# Patient Record
Sex: Female | Born: 1945 | Race: White | Hispanic: No | Marital: Married | Smoking: Current every day smoker
Health system: Southern US, Community
[De-identification: ages and names within clinical notes are randomized; demographics above are authoritative.]

## PROBLEM LIST (undated history)

## (undated) DIAGNOSIS — R87610 Atypical squamous cells of undetermined significance on cytologic smear of cervix (ASC-US): Secondary | ICD-10-CM

## (undated) DIAGNOSIS — N8501 Benign endometrial hyperplasia: Secondary | ICD-10-CM

## (undated) DIAGNOSIS — Z9221 Personal history of antineoplastic chemotherapy: Secondary | ICD-10-CM

## (undated) DIAGNOSIS — IMO0002 Reserved for concepts with insufficient information to code with codable children: Secondary | ICD-10-CM

## (undated) DIAGNOSIS — K219 Gastro-esophageal reflux disease without esophagitis: Secondary | ICD-10-CM

## (undated) DIAGNOSIS — C50919 Malignant neoplasm of unspecified site of unspecified female breast: Secondary | ICD-10-CM

## (undated) DIAGNOSIS — Z79890 Hormone replacement therapy (postmenopausal): Secondary | ICD-10-CM

## (undated) DIAGNOSIS — Z9882 Breast implant status: Secondary | ICD-10-CM

## (undated) DIAGNOSIS — L409 Psoriasis, unspecified: Secondary | ICD-10-CM

## (undated) DIAGNOSIS — M1990 Unspecified osteoarthritis, unspecified site: Secondary | ICD-10-CM

## (undated) DIAGNOSIS — Z780 Asymptomatic menopausal state: Secondary | ICD-10-CM

## (undated) DIAGNOSIS — K635 Polyp of colon: Secondary | ICD-10-CM

## (undated) DIAGNOSIS — M810 Age-related osteoporosis without current pathological fracture: Secondary | ICD-10-CM

## (undated) HISTORY — DX: Breast implant status: Z98.82

## (undated) HISTORY — PX: TUBAL LIGATION: SHX77

## (undated) HISTORY — PX: SALPINGECTOMY: SHX328

## (undated) HISTORY — PX: MASTECTOMY: SHX3

## (undated) HISTORY — DX: Atypical squamous cells of undetermined significance on cytologic smear of cervix (ASC-US): R87.610

## (undated) HISTORY — DX: Reserved for concepts with insufficient information to code with codable children: IMO0002

## (undated) HISTORY — DX: Hormone replacement therapy: Z79.890

## (undated) HISTORY — DX: Unspecified osteoarthritis, unspecified site: M19.90

## (undated) HISTORY — DX: Polyp of colon: K63.5

## (undated) HISTORY — DX: Age-related osteoporosis without current pathological fracture: M81.0

## (undated) HISTORY — PX: OTHER SURGICAL HISTORY: SHX169

## (undated) HISTORY — DX: Psoriasis, unspecified: L40.9

## (undated) HISTORY — DX: Asymptomatic menopausal state: Z78.0

## (undated) HISTORY — PX: COLONOSCOPY: SHX174

## (undated) HISTORY — DX: Malignant neoplasm of unspecified site of unspecified female breast: C50.919

## (undated) HISTORY — DX: Benign endometrial hyperplasia: N85.01

---

## 1981-09-07 HISTORY — PX: APPENDECTOMY: SHX54

## 1985-09-07 DIAGNOSIS — Z9882 Breast implant status: Secondary | ICD-10-CM

## 1985-09-07 HISTORY — DX: Breast implant status: Z98.82

## 1985-09-07 HISTORY — PX: BREAST SURGERY: SHX581

## 1998-09-07 DIAGNOSIS — N8501 Benign endometrial hyperplasia: Secondary | ICD-10-CM

## 1998-09-07 HISTORY — DX: Benign endometrial hyperplasia: N85.01

## 1999-03-04 ENCOUNTER — Ambulatory Visit (HOSPITAL_COMMUNITY): Admission: RE | Admit: 1999-03-04 | Discharge: 1999-03-04 | Payer: Self-pay | Admitting: Internal Medicine

## 1999-03-04 ENCOUNTER — Encounter (INDEPENDENT_AMBULATORY_CARE_PROVIDER_SITE_OTHER): Payer: Self-pay | Admitting: Specialist

## 1999-03-27 ENCOUNTER — Encounter: Payer: Self-pay | Admitting: Obstetrics and Gynecology

## 1999-03-27 ENCOUNTER — Ambulatory Visit (HOSPITAL_COMMUNITY): Admission: RE | Admit: 1999-03-27 | Discharge: 1999-03-27 | Payer: Self-pay | Admitting: Obstetrics and Gynecology

## 1999-09-08 DIAGNOSIS — K635 Polyp of colon: Secondary | ICD-10-CM

## 1999-09-08 HISTORY — DX: Polyp of colon: K63.5

## 1999-09-16 ENCOUNTER — Encounter (INDEPENDENT_AMBULATORY_CARE_PROVIDER_SITE_OTHER): Payer: Self-pay

## 1999-09-16 ENCOUNTER — Ambulatory Visit (HOSPITAL_COMMUNITY): Admission: RE | Admit: 1999-09-16 | Discharge: 1999-09-16 | Payer: Self-pay | Admitting: Obstetrics and Gynecology

## 1999-12-29 ENCOUNTER — Other Ambulatory Visit: Admission: RE | Admit: 1999-12-29 | Discharge: 1999-12-29 | Payer: Self-pay | Admitting: Obstetrics and Gynecology

## 2000-02-23 ENCOUNTER — Encounter (INDEPENDENT_AMBULATORY_CARE_PROVIDER_SITE_OTHER): Payer: Self-pay

## 2000-02-23 ENCOUNTER — Ambulatory Visit (HOSPITAL_COMMUNITY): Admission: RE | Admit: 2000-02-23 | Discharge: 2000-02-23 | Payer: Self-pay | Admitting: Gastroenterology

## 2000-08-02 ENCOUNTER — Ambulatory Visit (HOSPITAL_COMMUNITY): Admission: RE | Admit: 2000-08-02 | Discharge: 2000-08-02 | Payer: Self-pay | Admitting: Orthopedic Surgery

## 2000-08-02 ENCOUNTER — Encounter: Payer: Self-pay | Admitting: Orthopedic Surgery

## 2000-08-18 ENCOUNTER — Ambulatory Visit (HOSPITAL_COMMUNITY): Admission: RE | Admit: 2000-08-18 | Discharge: 2000-08-18 | Payer: Self-pay | Admitting: Orthopedic Surgery

## 2000-08-18 ENCOUNTER — Encounter: Payer: Self-pay | Admitting: Orthopedic Surgery

## 2000-09-23 ENCOUNTER — Ambulatory Visit (HOSPITAL_COMMUNITY): Admission: RE | Admit: 2000-09-23 | Discharge: 2000-09-23 | Payer: Self-pay | Admitting: Orthopedic Surgery

## 2000-09-23 ENCOUNTER — Encounter: Payer: Self-pay | Admitting: Orthopedic Surgery

## 2001-05-25 ENCOUNTER — Other Ambulatory Visit: Admission: RE | Admit: 2001-05-25 | Discharge: 2001-05-25 | Payer: Self-pay | Admitting: Obstetrics and Gynecology

## 2001-09-07 DIAGNOSIS — IMO0002 Reserved for concepts with insufficient information to code with codable children: Secondary | ICD-10-CM

## 2001-09-07 HISTORY — DX: Reserved for concepts with insufficient information to code with codable children: IMO0002

## 2002-06-07 ENCOUNTER — Other Ambulatory Visit: Admission: RE | Admit: 2002-06-07 | Discharge: 2002-06-07 | Payer: Self-pay | Admitting: Obstetrics and Gynecology

## 2003-07-04 ENCOUNTER — Other Ambulatory Visit: Admission: RE | Admit: 2003-07-04 | Discharge: 2003-07-04 | Payer: Self-pay | Admitting: Obstetrics and Gynecology

## 2004-03-19 ENCOUNTER — Encounter: Admission: RE | Admit: 2004-03-19 | Discharge: 2004-03-19 | Payer: Self-pay | Admitting: Obstetrics and Gynecology

## 2004-08-13 ENCOUNTER — Other Ambulatory Visit: Admission: RE | Admit: 2004-08-13 | Discharge: 2004-08-13 | Payer: Self-pay | Admitting: Obstetrics and Gynecology

## 2005-04-08 ENCOUNTER — Encounter: Admission: RE | Admit: 2005-04-08 | Discharge: 2005-04-08 | Payer: Self-pay | Admitting: Obstetrics and Gynecology

## 2005-05-11 ENCOUNTER — Emergency Department (HOSPITAL_COMMUNITY): Admission: EM | Admit: 2005-05-11 | Discharge: 2005-05-12 | Payer: Self-pay | Admitting: Emergency Medicine

## 2005-09-29 ENCOUNTER — Ambulatory Visit (HOSPITAL_COMMUNITY): Admission: RE | Admit: 2005-09-29 | Discharge: 2005-09-29 | Payer: Self-pay | Admitting: Internal Medicine

## 2005-10-26 ENCOUNTER — Other Ambulatory Visit: Admission: RE | Admit: 2005-10-26 | Discharge: 2005-10-26 | Payer: Self-pay | Admitting: Obstetrics and Gynecology

## 2006-05-25 ENCOUNTER — Encounter: Admission: RE | Admit: 2006-05-25 | Discharge: 2006-05-25 | Payer: Self-pay | Admitting: Obstetrics and Gynecology

## 2006-06-10 ENCOUNTER — Ambulatory Visit (HOSPITAL_BASED_OUTPATIENT_CLINIC_OR_DEPARTMENT_OTHER): Admission: RE | Admit: 2006-06-10 | Discharge: 2006-06-10 | Payer: Self-pay | Admitting: Orthopedic Surgery

## 2006-06-21 ENCOUNTER — Encounter: Admission: RE | Admit: 2006-06-21 | Discharge: 2006-06-21 | Payer: Self-pay | Admitting: Orthopedic Surgery

## 2006-07-12 ENCOUNTER — Encounter: Admission: RE | Admit: 2006-07-12 | Discharge: 2006-07-12 | Payer: Self-pay | Admitting: Orthopedic Surgery

## 2006-09-29 ENCOUNTER — Encounter: Admission: RE | Admit: 2006-09-29 | Discharge: 2006-09-29 | Payer: Self-pay | Admitting: Orthopedic Surgery

## 2006-11-19 ENCOUNTER — Ambulatory Visit (HOSPITAL_BASED_OUTPATIENT_CLINIC_OR_DEPARTMENT_OTHER): Admission: RE | Admit: 2006-11-19 | Discharge: 2006-11-19 | Payer: Self-pay | Admitting: Orthopedic Surgery

## 2006-12-15 ENCOUNTER — Other Ambulatory Visit: Admission: RE | Admit: 2006-12-15 | Discharge: 2006-12-15 | Payer: Self-pay | Admitting: Obstetrics and Gynecology

## 2007-06-30 ENCOUNTER — Encounter: Admission: RE | Admit: 2007-06-30 | Discharge: 2007-06-30 | Payer: Self-pay | Admitting: Obstetrics and Gynecology

## 2008-01-13 ENCOUNTER — Other Ambulatory Visit: Admission: RE | Admit: 2008-01-13 | Discharge: 2008-01-13 | Payer: Self-pay | Admitting: Obstetrics and Gynecology

## 2008-07-02 ENCOUNTER — Encounter: Admission: RE | Admit: 2008-07-02 | Discharge: 2008-07-02 | Payer: Self-pay | Admitting: Obstetrics and Gynecology

## 2009-07-03 ENCOUNTER — Encounter: Admission: RE | Admit: 2009-07-03 | Discharge: 2009-07-03 | Payer: Self-pay | Admitting: Obstetrics and Gynecology

## 2009-09-07 DIAGNOSIS — C50919 Malignant neoplasm of unspecified site of unspecified female breast: Secondary | ICD-10-CM

## 2009-09-07 HISTORY — DX: Malignant neoplasm of unspecified site of unspecified female breast: C50.919

## 2010-02-20 ENCOUNTER — Encounter: Admission: RE | Admit: 2010-02-20 | Discharge: 2010-02-20 | Payer: Self-pay | Admitting: Orthopedic Surgery

## 2010-03-14 ENCOUNTER — Encounter: Admission: RE | Admit: 2010-03-14 | Discharge: 2010-03-14 | Payer: Self-pay | Admitting: Orthopedic Surgery

## 2010-08-08 ENCOUNTER — Encounter: Admission: RE | Admit: 2010-08-08 | Discharge: 2010-08-08 | Payer: Self-pay | Admitting: Obstetrics and Gynecology

## 2010-08-18 ENCOUNTER — Encounter
Admission: RE | Admit: 2010-08-18 | Discharge: 2010-08-18 | Payer: Self-pay | Source: Home / Self Care | Attending: Obstetrics and Gynecology | Admitting: Obstetrics and Gynecology

## 2010-08-22 ENCOUNTER — Ambulatory Visit: Payer: Self-pay | Admitting: Genetic Counselor

## 2010-08-22 ENCOUNTER — Encounter
Admission: RE | Admit: 2010-08-22 | Discharge: 2010-08-22 | Payer: Self-pay | Source: Home / Self Care | Attending: Obstetrics and Gynecology | Admitting: Obstetrics and Gynecology

## 2010-09-05 ENCOUNTER — Ambulatory Visit (HOSPITAL_BASED_OUTPATIENT_CLINIC_OR_DEPARTMENT_OTHER): Payer: Self-pay | Admitting: Oncology

## 2010-09-09 LAB — COMPREHENSIVE METABOLIC PANEL
ALT: 10 U/L (ref 0–35)
AST: 17 U/L (ref 0–37)
Albumin: 4.1 g/dL (ref 3.5–5.2)
Alkaline Phosphatase: 78 U/L (ref 39–117)
BUN: 19 mg/dL (ref 6–23)
CO2: 25 mEq/L (ref 19–32)
Calcium: 8.5 mg/dL (ref 8.4–10.5)
Chloride: 106 mEq/L (ref 96–112)
Creatinine, Ser: 0.74 mg/dL (ref 0.40–1.20)
Glucose, Bld: 91 mg/dL (ref 70–99)
Potassium: 4.5 mEq/L (ref 3.5–5.3)
Sodium: 140 mEq/L (ref 135–145)
Total Bilirubin: 0.3 mg/dL (ref 0.3–1.2)
Total Protein: 6.2 g/dL (ref 6.0–8.3)

## 2010-09-09 LAB — CBC WITH DIFFERENTIAL/PLATELET
BASO%: 0.5 % (ref 0.0–2.0)
Basophils Absolute: 0 10*3/uL (ref 0.0–0.1)
EOS%: 1 % (ref 0.0–7.0)
Eosinophils Absolute: 0 10*3/uL (ref 0.0–0.5)
HCT: 40.8 % (ref 34.8–46.6)
HGB: 14.1 g/dL (ref 11.6–15.9)
LYMPH%: 34.9 % (ref 14.0–49.7)
MCH: 32.9 pg (ref 25.1–34.0)
MCHC: 34.6 g/dL (ref 31.5–36.0)
MCV: 95.1 fL (ref 79.5–101.0)
MONO#: 0.4 10*3/uL (ref 0.1–0.9)
MONO%: 7.8 % (ref 0.0–14.0)
NEUT#: 2.8 10*3/uL (ref 1.5–6.5)
NEUT%: 55.8 % (ref 38.4–76.8)
Platelets: 361 10*3/uL (ref 145–400)
RBC: 4.29 10*6/uL (ref 3.70–5.45)
RDW: 13.9 % (ref 11.2–14.5)
WBC: 4.9 10*3/uL (ref 3.9–10.3)
lymph#: 1.7 10*3/uL (ref 0.9–3.3)

## 2010-09-09 LAB — CANCER ANTIGEN 27.29: CA 27.29: 30 U/mL (ref 0–39)

## 2010-09-16 ENCOUNTER — Encounter: Payer: Self-pay | Admitting: Oncology

## 2010-09-16 ENCOUNTER — Ambulatory Visit (HOSPITAL_BASED_OUTPATIENT_CLINIC_OR_DEPARTMENT_OTHER)
Admission: RE | Admit: 2010-09-16 | Discharge: 2010-10-01 | Payer: No Typology Code available for payment source | Source: Home / Self Care | Attending: Radiation Oncology | Admitting: Radiation Oncology

## 2010-09-16 ENCOUNTER — Ambulatory Visit (HOSPITAL_COMMUNITY)
Admission: RE | Admit: 2010-09-16 | Discharge: 2010-09-16 | Payer: Self-pay | Source: Home / Self Care | Attending: General Surgery | Admitting: General Surgery

## 2010-09-16 ENCOUNTER — Ambulatory Visit (HOSPITAL_COMMUNITY)
Admission: RE | Admit: 2010-09-16 | Discharge: 2010-09-16 | Payer: Self-pay | Source: Home / Self Care | Attending: Oncology | Admitting: Oncology

## 2010-09-17 LAB — CBC WITH DIFFERENTIAL/PLATELET
BASO%: 0.5 % (ref 0.0–2.0)
Basophils Absolute: 0 10*3/uL (ref 0.0–0.1)
EOS%: 2 % (ref 0.0–7.0)
Eosinophils Absolute: 0.1 10*3/uL (ref 0.0–0.5)
HCT: 40.5 % (ref 34.8–46.6)
HGB: 13.7 g/dL (ref 11.6–15.9)
LYMPH%: 30.4 % (ref 14.0–49.7)
MCH: 32.3 pg (ref 25.1–34.0)
MCHC: 34 g/dL (ref 31.5–36.0)
MCV: 95.2 fL (ref 79.5–101.0)
MONO#: 0.6 10*3/uL (ref 0.1–0.9)
MONO%: 9 % (ref 0.0–14.0)
NEUT#: 3.8 10*3/uL (ref 1.5–6.5)
NEUT%: 58.1 % (ref 38.4–76.8)
Platelets: 350 10*3/uL (ref 145–400)
RBC: 4.25 10*6/uL (ref 3.70–5.45)
RDW: 13.5 % (ref 11.2–14.5)
WBC: 6.6 10*3/uL (ref 3.9–10.3)
lymph#: 2 10*3/uL (ref 0.9–3.3)

## 2010-09-17 LAB — COMPREHENSIVE METABOLIC PANEL
ALT: 12 U/L (ref 0–35)
AST: 15 U/L (ref 0–37)
Albumin: 4.2 g/dL (ref 3.5–5.2)
Alkaline Phosphatase: 86 U/L (ref 39–117)
BUN: 20 mg/dL (ref 6–23)
CO2: 28 mEq/L (ref 19–32)
Calcium: 9 mg/dL (ref 8.4–10.5)
Chloride: 104 mEq/L (ref 96–112)
Creatinine, Ser: 1 mg/dL (ref 0.40–1.20)
Glucose, Bld: 89 mg/dL (ref 70–99)
Potassium: 4.5 mEq/L (ref 3.5–5.3)
Sodium: 139 mEq/L (ref 135–145)
Total Bilirubin: 0.3 mg/dL (ref 0.3–1.2)
Total Protein: 6.4 g/dL (ref 6.0–8.3)

## 2010-09-22 LAB — BASIC METABOLIC PANEL
BUN: 23 mg/dL (ref 6–23)
CO2: 22 mEq/L (ref 19–32)
Calcium: 9.3 mg/dL (ref 8.4–10.5)
Chloride: 103 mEq/L (ref 96–112)
Creatinine, Ser: 1.07 mg/dL (ref 0.4–1.2)
GFR calc Af Amer: >60 mL/min (ref >60)
GFR calc non Af Amer: 52 mL/min — ABNORMAL LOW (ref >60)
Glucose, Bld: 108 mg/dL — ABNORMAL HIGH (ref 70–99)
Potassium: 4.6 mEq/L (ref 3.5–5.1)
Sodium: 139 mEq/L (ref 135–145)

## 2010-09-22 LAB — SURGICAL PCR SCREEN
MRSA, PCR: NEGATIVE
Staphylococcus aureus: NEGATIVE

## 2010-09-22 LAB — CBC
HCT: 44.9 % (ref 36.0–46.0)
Hemoglobin: 15 g/dL (ref 12.0–15.0)
MCH: 31.8 pg (ref 26.0–34.0)
MCHC: 33.4 g/dL (ref 30.0–36.0)
MCV: 95.3 fL (ref 78.0–100.0)
Platelets: 338 10*3/uL (ref 150–400)
RBC: 4.71 MIL/uL (ref 3.87–5.11)
RDW: 13.7 % (ref 11.5–15.5)
WBC: 6.4 10*3/uL (ref 4.0–10.5)

## 2010-09-25 LAB — CBC WITH DIFFERENTIAL/PLATELET
BASO%: 0.5 % (ref 0.0–2.0)
Basophils Absolute: 0 10*3/uL (ref 0.0–0.1)
EOS%: 4.4 % (ref 0.0–7.0)
Eosinophils Absolute: 0.1 10*3/uL (ref 0.0–0.5)
HCT: 40.1 % (ref 34.8–46.6)
HGB: 13.6 g/dL (ref 11.6–15.9)
LYMPH%: 47.9 % (ref 14.0–49.7)
MCH: 32.3 pg (ref 25.1–34.0)
MCHC: 33.9 g/dL (ref 31.5–36.0)
MCV: 95.2 fL (ref 79.5–101.0)
MONO#: 0 10*3/uL — ABNORMAL LOW (ref 0.1–0.9)
MONO%: 1.4 % (ref 0.0–14.0)
NEUT#: 0.8 10*3/uL — ABNORMAL LOW (ref 1.5–6.5)
NEUT%: 45.8 % (ref 38.4–76.8)
Platelets: 192 10*3/uL (ref 145–400)
RBC: 4.22 10*6/uL (ref 3.70–5.45)
RDW: 13.5 % (ref 11.2–14.5)
WBC: 1.7 10*3/uL — ABNORMAL LOW (ref 3.9–10.3)
lymph#: 0.8 10*3/uL — ABNORMAL LOW (ref 0.9–3.3)

## 2010-09-25 LAB — BASIC METABOLIC PANEL
BUN: 21 mg/dL (ref 6–23)
CO2: 30 mEq/L (ref 19–32)
Calcium: 10.1 mg/dL (ref 8.4–10.5)
Chloride: 102 mEq/L (ref 96–112)
Creatinine, Ser: 0.82 mg/dL (ref 0.40–1.20)
Glucose, Bld: 100 mg/dL — ABNORMAL HIGH (ref 70–99)
Potassium: 4.6 mEq/L (ref 3.5–5.3)
Sodium: 141 mEq/L (ref 135–145)

## 2010-09-27 ENCOUNTER — Encounter: Payer: Self-pay | Admitting: Obstetrics and Gynecology

## 2010-09-28 ENCOUNTER — Encounter: Payer: Self-pay | Admitting: Orthopedic Surgery

## 2010-09-29 ENCOUNTER — Encounter: Payer: Self-pay | Admitting: Obstetrics and Gynecology

## 2010-10-02 DIAGNOSIS — C50919 Malignant neoplasm of unspecified site of unspecified female breast: Secondary | ICD-10-CM

## 2010-10-02 LAB — COMPREHENSIVE METABOLIC PANEL
ALT: 16 U/L (ref 0–35)
AST: 19 U/L (ref 0–37)
Albumin: 4.2 g/dL (ref 3.5–5.2)
Alkaline Phosphatase: 117 U/L (ref 39–117)
BUN: 14 mg/dL (ref 6–23)
CO2: 26 mEq/L (ref 19–32)
Calcium: 9.2 mg/dL (ref 8.4–10.5)
Chloride: 106 mEq/L (ref 96–112)
Creatinine, Ser: 0.87 mg/dL (ref 0.40–1.20)
Glucose, Bld: 92 mg/dL (ref 70–99)
Potassium: 4.4 mEq/L (ref 3.5–5.3)
Sodium: 141 mEq/L (ref 135–145)
Total Bilirubin: 0.2 mg/dL — ABNORMAL LOW (ref 0.3–1.2)
Total Protein: 6.2 g/dL (ref 6.0–8.3)

## 2010-10-02 LAB — CBC WITH DIFFERENTIAL/PLATELET
BASO%: 0.3 % (ref 0.0–2.0)
Basophils Absolute: 0 10*3/uL (ref 0.0–0.1)
EOS%: 0.7 % (ref 0.0–7.0)
Eosinophils Absolute: 0.1 10*3/uL (ref 0.0–0.5)
HCT: 36.2 % (ref 34.8–46.6)
HGB: 12.4 g/dL (ref 11.6–15.9)
LYMPH%: 19.9 % (ref 14.0–49.7)
MCH: 32.5 pg (ref 25.1–34.0)
MCHC: 34.3 g/dL (ref 31.5–36.0)
MCV: 94.8 fL (ref 79.5–101.0)
MONO#: 0.5 10*3/uL (ref 0.1–0.9)
MONO%: 4.3 % (ref 0.0–14.0)
NEUT#: 9.2 10*3/uL — ABNORMAL HIGH (ref 1.5–6.5)
NEUT%: 74.8 % (ref 38.4–76.8)
Platelets: 332 10*3/uL (ref 145–400)
RBC: 3.82 10*6/uL (ref 3.70–5.45)
RDW: 13 % (ref 11.2–14.5)
WBC: 12.4 10*3/uL — ABNORMAL HIGH (ref 3.9–10.3)
lymph#: 2.5 10*3/uL (ref 0.9–3.3)

## 2010-10-08 ENCOUNTER — Ambulatory Visit: Payer: No Typology Code available for payment source | Admitting: Radiation Oncology

## 2010-10-09 ENCOUNTER — Encounter (HOSPITAL_BASED_OUTPATIENT_CLINIC_OR_DEPARTMENT_OTHER): Payer: No Typology Code available for payment source | Admitting: Oncology

## 2010-10-09 DIAGNOSIS — C50919 Malignant neoplasm of unspecified site of unspecified female breast: Secondary | ICD-10-CM

## 2010-10-09 LAB — CBC WITH DIFFERENTIAL/PLATELET
BASO%: 0.9 % (ref 0.0–2.0)
Basophils Absolute: 0 10*3/uL (ref 0.0–0.1)
EOS%: 0.4 % (ref 0.0–7.0)
Eosinophils Absolute: 0 10*3/uL (ref 0.0–0.5)
HCT: 35.5 % (ref 34.8–46.6)
HGB: 12.2 g/dL (ref 11.6–15.9)
LYMPH%: 28.6 % (ref 14.0–49.7)
MCH: 32 pg (ref 25.1–34.0)
MCHC: 34.3 g/dL (ref 31.5–36.0)
MCV: 93.5 fL (ref 79.5–101.0)
MONO#: 0 10*3/uL — ABNORMAL LOW (ref 0.1–0.9)
MONO%: 1.6 % (ref 0.0–14.0)
NEUT#: 1.7 10*3/uL (ref 1.5–6.5)
NEUT%: 68.5 % (ref 38.4–76.8)
Platelets: 208 10*3/uL (ref 145–400)
RBC: 3.8 10*6/uL (ref 3.70–5.45)
RDW: 13.4 % (ref 11.2–14.5)
WBC: 2.5 10*3/uL — ABNORMAL LOW (ref 3.9–10.3)
lymph#: 0.7 10*3/uL — ABNORMAL LOW (ref 0.9–3.3)

## 2010-10-09 LAB — BASIC METABOLIC PANEL
BUN: 19 mg/dL (ref 6–23)
CO2: 23 mEq/L (ref 19–32)
Calcium: 8.9 mg/dL (ref 8.4–10.5)
Chloride: 102 mEq/L (ref 96–112)
Creatinine, Ser: 0.79 mg/dL (ref 0.40–1.20)
Glucose, Bld: 96 mg/dL (ref 70–99)
Potassium: 4.4 mEq/L (ref 3.5–5.3)
Sodium: 138 mEq/L (ref 135–145)

## 2010-10-13 ENCOUNTER — Other Ambulatory Visit: Payer: Self-pay | Admitting: Oncology

## 2010-10-13 ENCOUNTER — Other Ambulatory Visit (HOSPITAL_COMMUNITY): Payer: Self-pay | Admitting: Internal Medicine

## 2010-10-13 ENCOUNTER — Ambulatory Visit (HOSPITAL_COMMUNITY)
Admission: RE | Admit: 2010-10-13 | Discharge: 2010-10-13 | Disposition: A | Discharge status: Home / Self Care | Payer: No Typology Code available for payment source | Source: Ambulatory Visit | Attending: Oncology | Admitting: Oncology

## 2010-10-13 ENCOUNTER — Encounter (HOSPITAL_BASED_OUTPATIENT_CLINIC_OR_DEPARTMENT_OTHER): Payer: No Typology Code available for payment source | Admitting: Oncology

## 2010-10-13 DIAGNOSIS — C50919 Malignant neoplasm of unspecified site of unspecified female breast: Secondary | ICD-10-CM

## 2010-10-13 DIAGNOSIS — R1310 Dysphagia, unspecified: Secondary | ICD-10-CM

## 2010-10-13 DIAGNOSIS — Z5189 Encounter for other specified aftercare: Secondary | ICD-10-CM

## 2010-10-13 DIAGNOSIS — Z5111 Encounter for antineoplastic chemotherapy: Secondary | ICD-10-CM

## 2010-10-13 LAB — CBC WITH DIFFERENTIAL/PLATELET
BASO%: 0.4 % (ref 0.0–2.0)
Basophils Absolute: 0 10*3/uL (ref 0.0–0.1)
EOS%: 0.2 % (ref 0.0–7.0)
Eosinophils Absolute: 0 10*3/uL (ref 0.0–0.5)
HCT: 34.5 % — ABNORMAL LOW (ref 34.8–46.6)
HGB: 11.8 g/dL (ref 11.6–15.9)
LYMPH%: 16.4 % (ref 14.0–49.7)
MCH: 32.1 pg (ref 25.1–34.0)
MCHC: 34.3 g/dL (ref 31.5–36.0)
MCV: 93.7 fL (ref 79.5–101.0)
MONO#: 0.4 10*3/uL (ref 0.1–0.9)
MONO%: 4.8 % (ref 0.0–14.0)
NEUT#: 7.3 10*3/uL — ABNORMAL HIGH (ref 1.5–6.5)
NEUT%: 78.2 % — ABNORMAL HIGH (ref 38.4–76.8)
Platelets: 332 10*3/uL (ref 145–400)
RBC: 3.68 10*6/uL — ABNORMAL LOW (ref 3.70–5.45)
RDW: 13.3 % (ref 11.2–14.5)
WBC: 9.4 10*3/uL (ref 3.9–10.3)
lymph#: 1.5 10*3/uL (ref 0.9–3.3)

## 2010-10-13 LAB — COMPREHENSIVE METABOLIC PANEL
ALT: 18 U/L (ref 0–35)
AST: 16 U/L (ref 0–37)
Albumin: 3.7 g/dL (ref 3.5–5.2)
Alkaline Phosphatase: 128 U/L — ABNORMAL HIGH (ref 39–117)
BUN: 16 mg/dL (ref 6–23)
CO2: 29 mEq/L (ref 19–32)
Calcium: 9.3 mg/dL (ref 8.4–10.5)
Chloride: 104 mEq/L (ref 96–112)
Creatinine, Ser: 0.8 mg/dL (ref 0.40–1.20)
Glucose, Bld: 71 mg/dL (ref 70–99)
Potassium: 4.3 mEq/L (ref 3.5–5.3)
Sodium: 140 mEq/L (ref 135–145)
Total Bilirubin: 0.5 mg/dL (ref 0.3–1.2)
Total Protein: 5.9 g/dL — ABNORMAL LOW (ref 6.0–8.3)

## 2010-10-17 ENCOUNTER — Encounter (HOSPITAL_BASED_OUTPATIENT_CLINIC_OR_DEPARTMENT_OTHER): Payer: No Typology Code available for payment source | Admitting: Oncology

## 2010-10-17 DIAGNOSIS — Z5111 Encounter for antineoplastic chemotherapy: Secondary | ICD-10-CM

## 2010-10-18 ENCOUNTER — Encounter: Payer: No Typology Code available for payment source | Admitting: Oncology

## 2010-10-18 DIAGNOSIS — C50919 Malignant neoplasm of unspecified site of unspecified female breast: Secondary | ICD-10-CM

## 2010-10-18 DIAGNOSIS — Z5189 Encounter for other specified aftercare: Secondary | ICD-10-CM

## 2010-10-23 ENCOUNTER — Other Ambulatory Visit: Payer: Self-pay | Admitting: Oncology

## 2010-10-23 ENCOUNTER — Encounter (HOSPITAL_BASED_OUTPATIENT_CLINIC_OR_DEPARTMENT_OTHER): Payer: No Typology Code available for payment source | Admitting: Oncology

## 2010-10-23 DIAGNOSIS — Z170 Estrogen receptor positive status [ER+]: Secondary | ICD-10-CM

## 2010-10-23 DIAGNOSIS — C50419 Malignant neoplasm of upper-outer quadrant of unspecified female breast: Secondary | ICD-10-CM

## 2010-10-23 DIAGNOSIS — C50919 Malignant neoplasm of unspecified site of unspecified female breast: Secondary | ICD-10-CM

## 2010-10-23 LAB — CBC WITH DIFFERENTIAL/PLATELET
BASO%: 0.7 % (ref 0.0–2.0)
Basophils Absolute: 0 10*3/uL (ref 0.0–0.1)
EOS%: 1 % (ref 0.0–7.0)
Eosinophils Absolute: 0 10*3/uL (ref 0.0–0.5)
HCT: 31.3 % — ABNORMAL LOW (ref 34.8–46.6)
HGB: 10.7 g/dL — ABNORMAL LOW (ref 11.6–15.9)
LYMPH%: 16.3 % (ref 14.0–49.7)
MCH: 31.6 pg (ref 25.1–34.0)
MCHC: 34.2 g/dL (ref 31.5–36.0)
MCV: 92.3 fL (ref 79.5–101.0)
MONO#: 0 10*3/uL — ABNORMAL LOW (ref 0.1–0.9)
MONO%: 1.1 % (ref 0.0–14.0)
NEUT#: 2.5 10*3/uL (ref 1.5–6.5)
NEUT%: 80.9 % — ABNORMAL HIGH (ref 38.4–76.8)
Platelets: 174 10*3/uL (ref 145–400)
RBC: 3.39 10*6/uL — ABNORMAL LOW (ref 3.70–5.45)
RDW: 13.5 % (ref 11.2–14.5)
WBC: 3.1 10*3/uL — ABNORMAL LOW (ref 3.9–10.3)
lymph#: 0.5 10*3/uL — ABNORMAL LOW (ref 0.9–3.3)

## 2010-10-23 LAB — BASIC METABOLIC PANEL
BUN: 17 mg/dL (ref 6–23)
CO2: 27 mEq/L (ref 19–32)
Calcium: 8.9 mg/dL (ref 8.4–10.5)
Chloride: 100 mEq/L (ref 96–112)
Creatinine, Ser: 0.72 mg/dL (ref 0.40–1.20)
Glucose, Bld: 105 mg/dL — ABNORMAL HIGH (ref 70–99)
Potassium: 4.3 mEq/L (ref 3.5–5.3)
Sodium: 137 mEq/L (ref 135–145)

## 2010-10-30 ENCOUNTER — Encounter (HOSPITAL_BASED_OUTPATIENT_CLINIC_OR_DEPARTMENT_OTHER): Payer: No Typology Code available for payment source | Admitting: Oncology

## 2010-10-30 ENCOUNTER — Other Ambulatory Visit: Payer: Self-pay | Admitting: Oncology

## 2010-10-30 DIAGNOSIS — Z170 Estrogen receptor positive status [ER+]: Secondary | ICD-10-CM

## 2010-10-30 DIAGNOSIS — C50919 Malignant neoplasm of unspecified site of unspecified female breast: Secondary | ICD-10-CM

## 2010-10-30 DIAGNOSIS — C50419 Malignant neoplasm of upper-outer quadrant of unspecified female breast: Secondary | ICD-10-CM

## 2010-10-30 LAB — COMPREHENSIVE METABOLIC PANEL
ALT: 43 U/L — ABNORMAL HIGH (ref 0–35)
AST: 40 U/L — ABNORMAL HIGH (ref 0–37)
Albumin: 4.2 g/dL (ref 3.5–5.2)
Alkaline Phosphatase: 132 U/L — ABNORMAL HIGH (ref 39–117)
BUN: 15 mg/dL (ref 6–23)
CO2: 29 mEq/L (ref 19–32)
Calcium: 9.5 mg/dL (ref 8.4–10.5)
Chloride: 105 mEq/L (ref 96–112)
Creatinine, Ser: 0.89 mg/dL (ref 0.40–1.20)
Glucose, Bld: 96 mg/dL (ref 70–99)
Potassium: 4.4 mEq/L (ref 3.5–5.3)
Sodium: 140 mEq/L (ref 135–145)
Total Bilirubin: 0.3 mg/dL (ref 0.3–1.2)
Total Protein: 6.2 g/dL (ref 6.0–8.3)

## 2010-10-30 LAB — CBC WITH DIFFERENTIAL/PLATELET
BASO%: 0.5 % (ref 0.0–2.0)
Basophils Absolute: 0 10*3/uL (ref 0.0–0.1)
EOS%: 0.3 % (ref 0.0–7.0)
Eosinophils Absolute: 0 10*3/uL (ref 0.0–0.5)
HCT: 31.7 % — ABNORMAL LOW (ref 34.8–46.6)
HGB: 10.9 g/dL — ABNORMAL LOW (ref 11.6–15.9)
LYMPH%: 14.3 % (ref 14.0–49.7)
MCH: 31.8 pg (ref 25.1–34.0)
MCHC: 34.3 g/dL (ref 31.5–36.0)
MCV: 92.7 fL (ref 79.5–101.0)
MONO#: 0.8 10*3/uL (ref 0.1–0.9)
MONO%: 8 % (ref 0.0–14.0)
NEUT#: 7.8 10*3/uL — ABNORMAL HIGH (ref 1.5–6.5)
NEUT%: 76.9 % — ABNORMAL HIGH (ref 38.4–76.8)
Platelets: 532 10*3/uL — ABNORMAL HIGH (ref 145–400)
RBC: 3.42 10*6/uL — ABNORMAL LOW (ref 3.70–5.45)
RDW: 13.4 % (ref 11.2–14.5)
WBC: 10.1 10*3/uL (ref 3.9–10.3)
lymph#: 1.4 10*3/uL (ref 0.9–3.3)

## 2010-10-31 ENCOUNTER — Other Ambulatory Visit: Payer: Self-pay | Admitting: Oncology

## 2010-10-31 ENCOUNTER — Encounter (HOSPITAL_BASED_OUTPATIENT_CLINIC_OR_DEPARTMENT_OTHER): Payer: No Typology Code available for payment source | Admitting: Oncology

## 2010-10-31 DIAGNOSIS — C50911 Malignant neoplasm of unspecified site of right female breast: Secondary | ICD-10-CM

## 2010-10-31 DIAGNOSIS — C50919 Malignant neoplasm of unspecified site of unspecified female breast: Secondary | ICD-10-CM

## 2010-10-31 DIAGNOSIS — Z5111 Encounter for antineoplastic chemotherapy: Secondary | ICD-10-CM

## 2010-11-01 ENCOUNTER — Encounter (HOSPITAL_BASED_OUTPATIENT_CLINIC_OR_DEPARTMENT_OTHER): Payer: No Typology Code available for payment source | Admitting: Oncology

## 2010-11-01 DIAGNOSIS — C50919 Malignant neoplasm of unspecified site of unspecified female breast: Secondary | ICD-10-CM

## 2010-11-01 DIAGNOSIS — Z5189 Encounter for other specified aftercare: Secondary | ICD-10-CM

## 2010-11-03 ENCOUNTER — Ambulatory Visit
Admission: RE | Admit: 2010-11-03 | Discharge: 2010-11-03 | Disposition: A | Discharge status: Home / Self Care | Payer: No Typology Code available for payment source | Source: Ambulatory Visit | Attending: Oncology | Admitting: Oncology

## 2010-11-03 DIAGNOSIS — C50911 Malignant neoplasm of unspecified site of right female breast: Secondary | ICD-10-CM

## 2010-11-03 MED ORDER — GADOBENATE DIMEGLUMINE 529 MG/ML IV SOLN
12.0000 mL | Freq: Once | INTRAVENOUS | Status: AC | PRN
  Administered 2010-11-03: 12 mL via INTRAVENOUS

## 2010-11-07 ENCOUNTER — Other Ambulatory Visit: Payer: Self-pay | Admitting: Oncology

## 2010-11-07 ENCOUNTER — Encounter (INDEPENDENT_AMBULATORY_CARE_PROVIDER_SITE_OTHER): Payer: Self-pay | Admitting: *Deleted

## 2010-11-07 ENCOUNTER — Encounter (HOSPITAL_BASED_OUTPATIENT_CLINIC_OR_DEPARTMENT_OTHER): Payer: No Typology Code available for payment source | Admitting: Oncology

## 2010-11-07 DIAGNOSIS — R1310 Dysphagia, unspecified: Secondary | ICD-10-CM

## 2010-11-07 DIAGNOSIS — Z170 Estrogen receptor positive status [ER+]: Secondary | ICD-10-CM

## 2010-11-07 DIAGNOSIS — C50419 Malignant neoplasm of upper-outer quadrant of unspecified female breast: Secondary | ICD-10-CM

## 2010-11-07 DIAGNOSIS — C50919 Malignant neoplasm of unspecified site of unspecified female breast: Secondary | ICD-10-CM

## 2010-11-07 LAB — BASIC METABOLIC PANEL
BUN: 24 mg/dL — ABNORMAL HIGH (ref 6–23)
CO2: 25 mEq/L (ref 19–32)
Calcium: 8.8 mg/dL (ref 8.4–10.5)
Chloride: 105 mEq/L (ref 96–112)
Creatinine, Ser: 0.81 mg/dL (ref 0.40–1.20)
Glucose, Bld: 72 mg/dL (ref 70–99)
Potassium: 4.3 mEq/L (ref 3.5–5.3)
Sodium: 141 mEq/L (ref 135–145)

## 2010-11-07 LAB — CBC WITH DIFFERENTIAL/PLATELET
BASO%: 1 % (ref 0.0–2.0)
Basophils Absolute: 0 10*3/uL (ref 0.0–0.1)
EOS%: 0.6 % (ref 0.0–7.0)
Eosinophils Absolute: 0 10*3/uL (ref 0.0–0.5)
HCT: 27.4 % — ABNORMAL LOW (ref 34.8–46.6)
HGB: 9.5 g/dL — ABNORMAL LOW (ref 11.6–15.9)
LYMPH%: 13.6 % — ABNORMAL LOW (ref 14.0–49.7)
MCH: 31.8 pg (ref 25.1–34.0)
MCHC: 34.7 g/dL (ref 31.5–36.0)
MCV: 91.8 fL (ref 79.5–101.0)
MONO#: 0.2 10*3/uL (ref 0.1–0.9)
MONO%: 4.5 % (ref 0.0–14.0)
NEUT#: 3.5 10*3/uL (ref 1.5–6.5)
NEUT%: 80.3 % — ABNORMAL HIGH (ref 38.4–76.8)
Platelets: 253 10*3/uL (ref 145–400)
RBC: 2.99 10*6/uL — ABNORMAL LOW (ref 3.70–5.45)
RDW: 14.1 % (ref 11.2–14.5)
WBC: 4.4 10*3/uL (ref 3.9–10.3)
lymph#: 0.6 10*3/uL — ABNORMAL LOW (ref 0.9–3.3)

## 2010-11-13 ENCOUNTER — Other Ambulatory Visit: Payer: Self-pay | Admitting: Oncology

## 2010-11-13 ENCOUNTER — Encounter (HOSPITAL_BASED_OUTPATIENT_CLINIC_OR_DEPARTMENT_OTHER): Payer: No Typology Code available for payment source | Admitting: Oncology

## 2010-11-13 DIAGNOSIS — Z170 Estrogen receptor positive status [ER+]: Secondary | ICD-10-CM

## 2010-11-13 DIAGNOSIS — C50919 Malignant neoplasm of unspecified site of unspecified female breast: Secondary | ICD-10-CM

## 2010-11-13 DIAGNOSIS — Z5111 Encounter for antineoplastic chemotherapy: Secondary | ICD-10-CM

## 2010-11-13 DIAGNOSIS — C50419 Malignant neoplasm of upper-outer quadrant of unspecified female breast: Secondary | ICD-10-CM

## 2010-11-13 LAB — COMPREHENSIVE METABOLIC PANEL
ALT: 12 U/L (ref 0–35)
AST: 18 U/L (ref 0–37)
Albumin: 4.1 g/dL (ref 3.5–5.2)
Alkaline Phosphatase: 132 U/L — ABNORMAL HIGH (ref 39–117)
BUN: 17 mg/dL (ref 6–23)
CO2: 23 mEq/L (ref 19–32)
Calcium: 8.7 mg/dL (ref 8.4–10.5)
Chloride: 105 mEq/L (ref 96–112)
Creatinine, Ser: 0.85 mg/dL (ref 0.40–1.20)
Glucose, Bld: 97 mg/dL (ref 70–99)
Potassium: 4.3 mEq/L (ref 3.5–5.3)
Sodium: 140 mEq/L (ref 135–145)
Total Bilirubin: 0.3 mg/dL (ref 0.3–1.2)
Total Protein: 5.6 g/dL — ABNORMAL LOW (ref 6.0–8.3)

## 2010-11-13 LAB — CBC WITH DIFFERENTIAL/PLATELET
BASO%: 2.2 % — ABNORMAL HIGH (ref 0.0–2.0)
Basophils Absolute: 0.4 10*3/uL — ABNORMAL HIGH (ref 0.0–0.1)
EOS%: 0.2 % (ref 0.0–7.0)
Eosinophils Absolute: 0 10*3/uL (ref 0.0–0.5)
HCT: 28.4 % — ABNORMAL LOW (ref 34.8–46.6)
HGB: 9.5 g/dL — ABNORMAL LOW (ref 11.6–15.9)
LYMPH%: 12.1 % — ABNORMAL LOW (ref 14.0–49.7)
MCH: 31.4 pg (ref 25.1–34.0)
MCHC: 33.5 g/dL (ref 31.5–36.0)
MCV: 93.7 fL (ref 79.5–101.0)
MONO#: 1.8 10*3/uL — ABNORMAL HIGH (ref 0.1–0.9)
MONO%: 10.4 % (ref 0.0–14.0)
NEUT#: 13.2 10*3/uL — ABNORMAL HIGH (ref 1.5–6.5)
NEUT%: 75.1 % (ref 38.4–76.8)
Platelets: 362 10*3/uL (ref 145–400)
RBC: 3.03 10*6/uL — ABNORMAL LOW (ref 3.70–5.45)
RDW: 16.8 % — ABNORMAL HIGH (ref 11.2–14.5)
WBC: 17.6 10*3/uL — ABNORMAL HIGH (ref 3.9–10.3)
lymph#: 2.1 10*3/uL (ref 0.9–3.3)
nRBC: 2 % — ABNORMAL HIGH (ref 0–0)

## 2010-11-13 NOTE — Letter (Signed)
Summary: New Patient letter  [facility]  [address] [place], [place] [address]   Phone: [phone]  Fax: [phone]       11/07/2010 MRN: [id number]  [facility] [address] [place], [place]  [address]  Dear [name],  Welcome to the Gastroenterology Division at [facility]    You are scheduled to see Dr.  Arlyce Dice on 12-17-10 at 2:00P.M. on the 3rd floor at [facility], [address].  We ask that you try to arrive at our office 15 minutes prior to your appointment time to allow for check-in.  We would like you to complete the enclosed self-administered evaluation form prior to your visit and bring it with you on the day of your appointment.  We will review it with you.  Also, please bring a complete list of all your medications or, if you prefer, bring the medication bottles and we will list them.  Please bring your insurance card so that we may make a copy of it.  If your insurance requires a referral to see a specialist, please bring your referral form from your primary care physician.  Co-payments are due at the time of your visit and may be paid by cash, check or credit card.     Your office visit will consist of a consult with your physician (includes a physical exam), any laboratory testing he/she may order, scheduling of any necessary diagnostic testing (e.g. x-ray, ultrasound, CT-scan), and scheduling of a procedure (e.g. Endoscopy, Colonoscopy) if required.  Please allow enough time on your schedule to allow for any/all of these possibilities.    If you cannot keep your appointment, please call [phone] to cancel or reschedule prior to your appointment date.  This allows [place] the opportunity to schedule an appointment for another patient in need of care.  If you do not cancel or reschedule by 5 p.m. the business day prior to your appointment date, you will be charged a $50.00 late cancellation/no-show fee.    Thank you for choosing  [facility] for your medical needs.  We appreciate the opportunity to care for you.  Please visit [place] at our website  to learn more about our practice.                     Sincerely,                                                             The Gastroenterology Division

## 2010-11-14 ENCOUNTER — Telehealth (INDEPENDENT_AMBULATORY_CARE_PROVIDER_SITE_OTHER): Payer: Self-pay

## 2010-11-17 ENCOUNTER — Ambulatory Visit: Payer: No Typology Code available for payment source | Admitting: Physician Assistant

## 2010-11-18 NOTE — Progress Notes (Signed)
Summary: Schedule office visit  ---- Converted from flag ---- ---- 11/14/2010 11:17 AM, Louis Meckel MD wrote: Have her see a PA/NP instead  ---- 11/14/2010 10:18 AM, Selinda Michaels RN wrote: Aurea Graff has told us absolutely no overbooking what so ever. You will have to check with Aurea Graff for this, I cannot overbook now during that time.  ---- 11/14/2010 8:34 AM, Louis Meckel MD wrote: Just double book her per my instructions  ---- 11/13/2010 2:26 PM, Selinda Michaels RN wrote: Dr. Arlyce Dice,  I looked at your schedule when you return and there are no appointment slots available. We are limited due to epic restrictions. Do you want her scheduled with one of the midlevels?  Bonita Quin ------------------------------  Phone Note Outgoing Call Call back at [facility] [phone]   Call placed by: Darcey Nora RN, CGRN,  November 14, 2010 4:02 PM Call placed to: Patient Summary of Call: patient advised of appointment scheduled for Mike Gip PA on 11/17/10 2:00 Initial call taken by: Darcey Nora RN, CGRN,  November 14, 2010 4:03 PM

## 2010-11-19 ENCOUNTER — Other Ambulatory Visit: Payer: Self-pay | Admitting: Oncology

## 2010-11-19 ENCOUNTER — Encounter (HOSPITAL_BASED_OUTPATIENT_CLINIC_OR_DEPARTMENT_OTHER): Payer: No Typology Code available for payment source | Admitting: Oncology

## 2010-11-19 DIAGNOSIS — Z170 Estrogen receptor positive status [ER+]: Secondary | ICD-10-CM

## 2010-11-19 DIAGNOSIS — Z5189 Encounter for other specified aftercare: Secondary | ICD-10-CM

## 2010-11-19 DIAGNOSIS — C50419 Malignant neoplasm of upper-outer quadrant of unspecified female breast: Secondary | ICD-10-CM

## 2010-11-19 DIAGNOSIS — C50919 Malignant neoplasm of unspecified site of unspecified female breast: Secondary | ICD-10-CM

## 2010-11-19 LAB — COMPREHENSIVE METABOLIC PANEL
ALT: 38 U/L — ABNORMAL HIGH (ref 0–35)
AST: 30 U/L (ref 0–37)
Albumin: 4.3 g/dL (ref 3.5–5.2)
Alkaline Phosphatase: 103 U/L (ref 39–117)
BUN: 25 mg/dL — ABNORMAL HIGH (ref 6–23)
CO2: 24 mEq/L (ref 19–32)
Calcium: 9.4 mg/dL (ref 8.4–10.5)
Chloride: 106 mEq/L (ref 96–112)
Creatinine, Ser: 0.89 mg/dL (ref 0.40–1.20)
Glucose, Bld: 73 mg/dL (ref 70–99)
Potassium: 4.3 mEq/L (ref 3.5–5.3)
Sodium: 140 mEq/L (ref 135–145)
Total Bilirubin: 0.3 mg/dL (ref 0.3–1.2)
Total Protein: 6.4 g/dL (ref 6.0–8.3)

## 2010-11-19 LAB — CBC WITH DIFFERENTIAL/PLATELET
BASO%: 1.1 % (ref 0.0–2.0)
Basophils Absolute: 0 10*3/uL (ref 0.0–0.1)
EOS%: 0.4 % (ref 0.0–7.0)
Eosinophils Absolute: 0 10*3/uL (ref 0.0–0.5)
HCT: 26.9 % — ABNORMAL LOW (ref 34.8–46.6)
HGB: 9.3 g/dL — ABNORMAL LOW (ref 11.6–15.9)
LYMPH%: 22.1 % (ref 14.0–49.7)
MCH: 32.4 pg (ref 25.1–34.0)
MCHC: 34.6 g/dL (ref 31.5–36.0)
MCV: 93.6 fL (ref 79.5–101.0)
MONO#: 0.4 10*3/uL (ref 0.1–0.9)
MONO%: 9.3 % (ref 0.0–14.0)
NEUT#: 2.6 10*3/uL (ref 1.5–6.5)
NEUT%: 67.1 % (ref 38.4–76.8)
Platelets: 432 10*3/uL — ABNORMAL HIGH (ref 145–400)
RBC: 2.87 10*6/uL — ABNORMAL LOW (ref 3.70–5.45)
RDW: 15.8 % — ABNORMAL HIGH (ref 11.2–14.5)
WBC: 3.9 10*3/uL (ref 3.9–10.3)
lymph#: 0.9 10*3/uL (ref 0.9–3.3)

## 2010-11-27 ENCOUNTER — Other Ambulatory Visit: Payer: Self-pay | Admitting: Physician Assistant

## 2010-11-27 ENCOUNTER — Encounter (HOSPITAL_BASED_OUTPATIENT_CLINIC_OR_DEPARTMENT_OTHER): Payer: No Typology Code available for payment source | Admitting: Oncology

## 2010-11-27 DIAGNOSIS — C50919 Malignant neoplasm of unspecified site of unspecified female breast: Secondary | ICD-10-CM

## 2010-11-27 DIAGNOSIS — Z170 Estrogen receptor positive status [ER+]: Secondary | ICD-10-CM

## 2010-11-27 DIAGNOSIS — D649 Anemia, unspecified: Secondary | ICD-10-CM

## 2010-11-27 DIAGNOSIS — Z5111 Encounter for antineoplastic chemotherapy: Secondary | ICD-10-CM

## 2010-11-27 LAB — CBC WITH DIFFERENTIAL/PLATELET
BASO%: 3 % — ABNORMAL HIGH (ref 0.0–2.0)
Basophils Absolute: 0.1 10*3/uL (ref 0.0–0.1)
EOS%: 3.1 % (ref 0.0–7.0)
Eosinophils Absolute: 0.1 10*3/uL (ref 0.0–0.5)
HCT: 28.1 % — ABNORMAL LOW (ref 34.8–46.6)
HGB: 9.5 g/dL — ABNORMAL LOW (ref 11.6–15.9)
LYMPH%: 31.6 % (ref 14.0–49.7)
MCH: 32.3 pg (ref 25.1–34.0)
MCHC: 33.9 g/dL (ref 31.5–36.0)
MCV: 95.4 fL (ref 79.5–101.0)
MONO#: 0.7 10*3/uL (ref 0.1–0.9)
MONO%: 19.4 % — ABNORMAL HIGH (ref 0.0–14.0)
NEUT#: 1.6 10*3/uL (ref 1.5–6.5)
NEUT%: 42.9 % (ref 38.4–76.8)
Platelets: 531 10*3/uL — ABNORMAL HIGH (ref 145–400)
RBC: 2.95 10*6/uL — ABNORMAL LOW (ref 3.70–5.45)
RDW: 19.8 % — ABNORMAL HIGH (ref 11.2–14.5)
WBC: 3.8 10*3/uL — ABNORMAL LOW (ref 3.9–10.3)
lymph#: 1.2 10*3/uL (ref 0.9–3.3)

## 2010-12-04 ENCOUNTER — Other Ambulatory Visit: Payer: Self-pay | Admitting: Physician Assistant

## 2010-12-04 ENCOUNTER — Encounter (HOSPITAL_BASED_OUTPATIENT_CLINIC_OR_DEPARTMENT_OTHER): Payer: No Typology Code available for payment source | Admitting: Oncology

## 2010-12-04 ENCOUNTER — Encounter (INDEPENDENT_AMBULATORY_CARE_PROVIDER_SITE_OTHER): Payer: Self-pay | Admitting: *Deleted

## 2010-12-04 DIAGNOSIS — Z170 Estrogen receptor positive status [ER+]: Secondary | ICD-10-CM

## 2010-12-04 DIAGNOSIS — D649 Anemia, unspecified: Secondary | ICD-10-CM

## 2010-12-04 DIAGNOSIS — C50919 Malignant neoplasm of unspecified site of unspecified female breast: Secondary | ICD-10-CM

## 2010-12-04 DIAGNOSIS — Z5111 Encounter for antineoplastic chemotherapy: Secondary | ICD-10-CM

## 2010-12-04 DIAGNOSIS — C50419 Malignant neoplasm of upper-outer quadrant of unspecified female breast: Secondary | ICD-10-CM

## 2010-12-04 LAB — CBC WITH DIFFERENTIAL/PLATELET
BASO%: 2.5 % — ABNORMAL HIGH (ref 0.0–2.0)
Basophils Absolute: 0.1 10*3/uL (ref 0.0–0.1)
EOS%: 6.6 % (ref 0.0–7.0)
Eosinophils Absolute: 0.2 10*3/uL (ref 0.0–0.5)
HCT: 30.4 % — ABNORMAL LOW (ref 34.8–46.6)
HGB: 9.8 g/dL — ABNORMAL LOW (ref 11.6–15.9)
LYMPH%: 36.6 % (ref 14.0–49.7)
MCH: 31.5 pg (ref 25.1–34.0)
MCHC: 32.2 g/dL (ref 31.5–36.0)
MCV: 97.7 fL (ref 79.5–101.0)
MONO#: 0.4 10*3/uL (ref 0.1–0.9)
MONO%: 12 % (ref 0.0–14.0)
NEUT#: 1.6 10*3/uL (ref 1.5–6.5)
NEUT%: 42.3 % (ref 38.4–76.8)
Platelets: 346 10*3/uL (ref 145–400)
RBC: 3.11 10*6/uL — ABNORMAL LOW (ref 3.70–5.45)
RDW: 19 % — ABNORMAL HIGH (ref 11.2–14.5)
WBC: 3.7 10*3/uL — ABNORMAL LOW (ref 3.9–10.3)
lymph#: 1.3 10*3/uL (ref 0.9–3.3)
nRBC: 0 % (ref 0–0)

## 2010-12-04 LAB — COMPREHENSIVE METABOLIC PANEL
ALT: 24 U/L (ref 0–35)
AST: 24 U/L (ref 0–37)
Albumin: 4.2 g/dL (ref 3.5–5.2)
Alkaline Phosphatase: 74 U/L (ref 39–117)
BUN: 20 mg/dL (ref 6–23)
CO2: 24 mEq/L (ref 19–32)
Calcium: 9.2 mg/dL (ref 8.4–10.5)
Chloride: 105 mEq/L (ref 96–112)
Creatinine, Ser: 0.76 mg/dL (ref 0.40–1.20)
Glucose, Bld: 92 mg/dL (ref 70–99)
Potassium: 4.7 mEq/L (ref 3.5–5.3)
Sodium: 138 mEq/L (ref 135–145)
Total Bilirubin: 0.3 mg/dL (ref 0.3–1.2)
Total Protein: 6.1 g/dL (ref 6.0–8.3)

## 2010-12-09 NOTE — Letter (Signed)
Summary: New Patient letter  [facility]  [address] [place], [place] [address]   Phone: [phone]  Fax: [phone]       12/04/2010 MRN: [id number]  [facility] [address] [place], [place]  [address]  Dear Ms. Dagley,  Welcome to the Gastroenterology Division at [facility].    You are scheduled to see Dr.  Arlyce Dice on 01-19-11 at 2:00P.M. on the 3rd floor at [facility], [address].  We ask that you try to arrive at our office 15 minutes prior to your appointment time to allow for check-in.  We would like you to complete the enclosed self-administered evaluation form prior to your visit and bring it with you on the day of your appointment.  We will review it with you.  Also, please bring a complete list of all your medications or, if you prefer, bring the medication bottles and we will list them.  Please bring your insurance card so that we may make a copy of it.  If your insurance requires a referral to see a specialist, please bring your referral form from your primary care physician.  Co-payments are due at the time of your visit and may be paid by cash, check or credit card.     Your office visit will consist of a consult with your physician (includes a physical exam), any laboratory testing he/she may order, scheduling of any necessary diagnostic testing (e.g. x-ray, ultrasound, CT-scan), and scheduling of a procedure (e.g. Endoscopy, Colonoscopy) if required.  Please allow enough time on your schedule to allow for any/all of these possibilities.    If you cannot keep your appointment, please call [phone] to cancel or reschedule prior to your appointment date.  This allows [place] the opportunity to schedule an appointment for another patient in need of care.  If you do not cancel or reschedule by 5 p.m. the business day prior to your appointment date, you will be charged a $50.00 late cancellation/no-show fee.    Thank you for choosing  [facility] for your medical needs.  We appreciate the opportunity to care for you.  Please visit [place] at our website  to learn more about our practice.                     Sincerely,                                                             The Gastroenterology Division

## 2010-12-11 ENCOUNTER — Encounter (HOSPITAL_BASED_OUTPATIENT_CLINIC_OR_DEPARTMENT_OTHER): Payer: No Typology Code available for payment source | Admitting: Oncology

## 2010-12-11 ENCOUNTER — Other Ambulatory Visit: Payer: Self-pay | Admitting: Physician Assistant

## 2010-12-11 DIAGNOSIS — Z170 Estrogen receptor positive status [ER+]: Secondary | ICD-10-CM

## 2010-12-11 DIAGNOSIS — Z5111 Encounter for antineoplastic chemotherapy: Secondary | ICD-10-CM

## 2010-12-11 DIAGNOSIS — C50919 Malignant neoplasm of unspecified site of unspecified female breast: Secondary | ICD-10-CM

## 2010-12-11 LAB — CBC WITH DIFFERENTIAL/PLATELET
BASO%: 2.6 % — ABNORMAL HIGH (ref 0.0–2.0)
Basophils Absolute: 0.1 10*3/uL (ref 0.0–0.1)
EOS%: 7.9 % — ABNORMAL HIGH (ref 0.0–7.0)
Eosinophils Absolute: 0.2 10*3/uL (ref 0.0–0.5)
HCT: 28.9 % — ABNORMAL LOW (ref 34.8–46.6)
HGB: 9.4 g/dL — ABNORMAL LOW (ref 11.6–15.9)
LYMPH%: 29.8 % (ref 14.0–49.7)
MCH: 32 pg (ref 25.1–34.0)
MCHC: 32.5 g/dL (ref 31.5–36.0)
MCV: 98.3 fL (ref 79.5–101.0)
MONO#: 0.3 10*3/uL (ref 0.1–0.9)
MONO%: 9.8 % (ref 0.0–14.0)
NEUT#: 1.5 10*3/uL (ref 1.5–6.5)
NEUT%: 49.9 % (ref 38.4–76.8)
Platelets: 351 10*3/uL (ref 145–400)
RBC: 2.94 10*6/uL — ABNORMAL LOW (ref 3.70–5.45)
RDW: 19.6 % — ABNORMAL HIGH (ref 11.2–14.5)
WBC: 3.1 10*3/uL — ABNORMAL LOW (ref 3.9–10.3)
lymph#: 0.9 10*3/uL (ref 0.9–3.3)
nRBC: 0 % (ref 0–0)

## 2010-12-17 ENCOUNTER — Ambulatory Visit: Payer: No Typology Code available for payment source | Admitting: Gastroenterology

## 2010-12-18 ENCOUNTER — Encounter (HOSPITAL_BASED_OUTPATIENT_CLINIC_OR_DEPARTMENT_OTHER): Payer: No Typology Code available for payment source | Admitting: Oncology

## 2010-12-18 ENCOUNTER — Other Ambulatory Visit: Payer: Self-pay | Admitting: Oncology

## 2010-12-18 DIAGNOSIS — C50919 Malignant neoplasm of unspecified site of unspecified female breast: Secondary | ICD-10-CM

## 2010-12-18 DIAGNOSIS — Z170 Estrogen receptor positive status [ER+]: Secondary | ICD-10-CM

## 2010-12-18 DIAGNOSIS — C50419 Malignant neoplasm of upper-outer quadrant of unspecified female breast: Secondary | ICD-10-CM

## 2010-12-18 DIAGNOSIS — Z5111 Encounter for antineoplastic chemotherapy: Secondary | ICD-10-CM

## 2010-12-18 LAB — CBC WITH DIFFERENTIAL/PLATELET
BASO%: 0.4 % (ref 0.0–2.0)
Basophils Absolute: 0 10*3/uL (ref 0.0–0.1)
EOS%: 2.7 % (ref 0.0–7.0)
Eosinophils Absolute: 0.2 10*3/uL (ref 0.0–0.5)
HCT: 31.2 % — ABNORMAL LOW (ref 34.8–46.6)
HGB: 10.2 g/dL — ABNORMAL LOW (ref 11.6–15.9)
LYMPH%: 18.3 % (ref 14.0–49.7)
MCH: 32.7 pg (ref 25.1–34.0)
MCHC: 32.7 g/dL (ref 31.5–36.0)
MCV: 100 fL (ref 79.5–101.0)
MONO#: 0.6 10*3/uL (ref 0.1–0.9)
MONO%: 8.8 % (ref 0.0–14.0)
NEUT#: 4.7 10*3/uL (ref 1.5–6.5)
NEUT%: 69.8 % (ref 38.4–76.8)
Platelets: 389 10*3/uL (ref 145–400)
RBC: 3.12 10*6/uL — ABNORMAL LOW (ref 3.70–5.45)
RDW: 18.8 % — ABNORMAL HIGH (ref 11.2–14.5)
WBC: 6.7 10*3/uL (ref 3.9–10.3)
lymph#: 1.2 10*3/uL (ref 0.9–3.3)
nRBC: 0 % (ref 0–0)

## 2010-12-18 LAB — COMPREHENSIVE METABOLIC PANEL
ALT: 25 U/L (ref 0–35)
AST: 24 U/L (ref 0–37)
Albumin: 3.5 g/dL (ref 3.5–5.2)
Alkaline Phosphatase: 79 U/L (ref 39–117)
BUN: 10 mg/dL (ref 6–23)
CO2: 25 mEq/L (ref 19–32)
Calcium: 8.8 mg/dL (ref 8.4–10.5)
Chloride: 110 mEq/L (ref 96–112)
Creatinine, Ser: 0.89 mg/dL (ref 0.40–1.20)
Glucose, Bld: 139 mg/dL — ABNORMAL HIGH (ref 70–99)
Potassium: 4.1 mEq/L (ref 3.5–5.3)
Sodium: 141 mEq/L (ref 135–145)
Total Bilirubin: 0.6 mg/dL (ref 0.3–1.2)
Total Protein: 5.5 g/dL — ABNORMAL LOW (ref 6.0–8.3)

## 2010-12-25 ENCOUNTER — Other Ambulatory Visit: Payer: Self-pay | Admitting: Gastroenterology

## 2010-12-25 ENCOUNTER — Ambulatory Visit (HOSPITAL_COMMUNITY)
Admission: RE | Admit: 2010-12-25 | Discharge: 2010-12-25 | Disposition: A | Discharge status: Home / Self Care | Payer: No Typology Code available for payment source | Source: Ambulatory Visit | Attending: Gastroenterology | Admitting: Gastroenterology

## 2010-12-25 ENCOUNTER — Encounter (HOSPITAL_BASED_OUTPATIENT_CLINIC_OR_DEPARTMENT_OTHER): Payer: No Typology Code available for payment source | Admitting: Oncology

## 2010-12-25 ENCOUNTER — Other Ambulatory Visit: Payer: Self-pay | Admitting: Oncology

## 2010-12-25 DIAGNOSIS — C50419 Malignant neoplasm of upper-outer quadrant of unspecified female breast: Secondary | ICD-10-CM

## 2010-12-25 DIAGNOSIS — Z5111 Encounter for antineoplastic chemotherapy: Secondary | ICD-10-CM

## 2010-12-25 DIAGNOSIS — Z170 Estrogen receptor positive status [ER+]: Secondary | ICD-10-CM | POA: Insufficient documentation

## 2010-12-25 DIAGNOSIS — R1310 Dysphagia, unspecified: Secondary | ICD-10-CM | POA: Insufficient documentation

## 2010-12-25 DIAGNOSIS — C50919 Malignant neoplasm of unspecified site of unspecified female breast: Secondary | ICD-10-CM | POA: Insufficient documentation

## 2010-12-25 DIAGNOSIS — Z79899 Other long term (current) drug therapy: Secondary | ICD-10-CM | POA: Insufficient documentation

## 2010-12-25 LAB — COMPREHENSIVE METABOLIC PANEL
ALT: 18 U/L (ref 0–35)
AST: 22 U/L (ref 0–37)
Albumin: 4 g/dL (ref 3.5–5.2)
Alkaline Phosphatase: 87 U/L (ref 39–117)
BUN: 17 mg/dL (ref 6–23)
CO2: 23 mEq/L (ref 19–32)
Calcium: 9.1 mg/dL (ref 8.4–10.5)
Chloride: 107 mEq/L (ref 96–112)
Creatinine, Ser: 0.83 mg/dL (ref 0.40–1.20)
Glucose, Bld: 86 mg/dL (ref 70–99)
Potassium: 4.4 mEq/L (ref 3.5–5.3)
Sodium: 142 mEq/L (ref 135–145)
Total Bilirubin: 0.2 mg/dL — ABNORMAL LOW (ref 0.3–1.2)
Total Protein: 5.9 g/dL — ABNORMAL LOW (ref 6.0–8.3)

## 2010-12-25 LAB — CBC WITH DIFFERENTIAL/PLATELET
BASO%: 1.8 % (ref 0.0–2.0)
Basophils Absolute: 0.1 10*3/uL (ref 0.0–0.1)
EOS%: 3.7 % (ref 0.0–7.0)
Eosinophils Absolute: 0.1 10*3/uL (ref 0.0–0.5)
HCT: 32.6 % — ABNORMAL LOW (ref 34.8–46.6)
HGB: 10.4 g/dL — ABNORMAL LOW (ref 11.6–15.9)
LYMPH%: 30.5 % (ref 14.0–49.7)
MCH: 32.4 pg (ref 25.1–34.0)
MCHC: 31.9 g/dL (ref 31.5–36.0)
MCV: 101.6 fL — ABNORMAL HIGH (ref 79.5–101.0)
MONO#: 0.5 10*3/uL (ref 0.1–0.9)
MONO%: 12 % (ref 0.0–14.0)
NEUT#: 2 10*3/uL (ref 1.5–6.5)
NEUT%: 52 % (ref 38.4–76.8)
Platelets: 462 10*3/uL — ABNORMAL HIGH (ref 145–400)
RBC: 3.21 10*6/uL — ABNORMAL LOW (ref 3.70–5.45)
RDW: 17.8 % — ABNORMAL HIGH (ref 11.2–14.5)
WBC: 3.8 10*3/uL — ABNORMAL LOW (ref 3.9–10.3)
lymph#: 1.2 10*3/uL (ref 0.9–3.3)
nRBC: 0 % (ref 0–0)

## 2010-12-31 NOTE — Op Note (Signed)
  NAMEMIRAGE, PFEFFERKORN NO.:  [id number]  MEDICAL RECORD NO.:  [id number]           PATIENT TYPE:  O  LOCATION:  [facility]                         FACILITY:  [facility]  PHYSICIAN:  Danise Edge, M.D.   DATE OF BIRTH:  1946/04/17  DATE OF PROCEDURE:  12/25/2010 DATE OF DISCHARGE:                              OPERATIVE REPORT   PROCEDURE:  Diagnostic esophagogastroduodenoscopy.  REFERRING PHYSICIAN:  Theressa Millard, M.D.  HISTORY:  Ms. [name] is a 65 year old female born 1946/04/03. The patient is receiving chemotherapy for breast cancer.  During her chemotherapy, the patient developed dysphagia without odynophagia.  She was placed on omeprazole and Carafate.  She underwent a barium esophagram which apparently did not show esophageal obstruction.  ENDOSCOPIST:  Danise Edge, M.D.  PREMEDICATIONS:  Fentanyl 100 mcg, Versed 10 mg.  DESCRIPTION OF PROCEDURE:  After obtaining informed consent, the patientwas placed in the left lateral decubitus position.  The Pentax gastroscope was passed through the posterior hypopharynx into the proximal esophagus without difficulty.  The hypopharynx and larynx appeared normal.  I did not visualize the vocal cords.  Esophagoscopy:  The proximal, mid, and lower segments of the esophageal mucosa appeared completely normal.  The squamocolumnar junction is noted at 40 cm from the incisor teeth.  There is no endoscopic evidence for the presence of erosive esophagitis, Barrett's esophagus or esophageal obstruction.  Gastroscopy:  Retroflex view of the gastric cardia and fundus was normal.  The gastric body, antrum and pylorus appeared normal.  Duodenoscopy:  The duodenal bulb and descending duodenum appeared normal.  Esophageal biopsies:  Random esophageal biopsies were performed along the length of the esophagus to look for eosinophilic esophagitis.  ASSESSMENT: 1. Normal esophagogastroduodenoscopy. 2. Esophageal  biopsies to rule out eosinophilic esophagitis pending.          ______________________________ Danise Edge, M.D.     MJ/MEDQ  D:  12/25/2010  T:  12/25/2010  Job:  [id number]  cc:   Theressa Millard, M.D. Fax: [phone]  [facility]  Electronically Signed by Danise Edge M.D. on 12/31/2010 12:07:24 PM

## 2011-01-01 ENCOUNTER — Encounter (HOSPITAL_BASED_OUTPATIENT_CLINIC_OR_DEPARTMENT_OTHER): Payer: No Typology Code available for payment source | Admitting: Oncology

## 2011-01-01 ENCOUNTER — Other Ambulatory Visit: Payer: Self-pay | Admitting: Oncology

## 2011-01-01 DIAGNOSIS — Z170 Estrogen receptor positive status [ER+]: Secondary | ICD-10-CM

## 2011-01-01 DIAGNOSIS — Z5111 Encounter for antineoplastic chemotherapy: Secondary | ICD-10-CM

## 2011-01-01 DIAGNOSIS — C50419 Malignant neoplasm of upper-outer quadrant of unspecified female breast: Secondary | ICD-10-CM

## 2011-01-01 DIAGNOSIS — C50919 Malignant neoplasm of unspecified site of unspecified female breast: Secondary | ICD-10-CM

## 2011-01-01 LAB — CBC WITH DIFFERENTIAL/PLATELET
BASO%: 1.5 % (ref 0.0–2.0)
Basophils Absolute: 0.1 10*3/uL (ref 0.0–0.1)
EOS%: 2.2 % (ref 0.0–7.0)
Eosinophils Absolute: 0.1 10*3/uL (ref 0.0–0.5)
HCT: 33.6 % — ABNORMAL LOW (ref 34.8–46.6)
HGB: 10.8 g/dL — ABNORMAL LOW (ref 11.6–15.9)
LYMPH%: 32.7 % (ref 14.0–49.7)
MCH: 32.9 pg (ref 25.1–34.0)
MCHC: 32.1 g/dL (ref 31.5–36.0)
MCV: 102.4 fL — ABNORMAL HIGH (ref 79.5–101.0)
MONO#: 0.5 10*3/uL (ref 0.1–0.9)
MONO%: 15.4 % — ABNORMAL HIGH (ref 0.0–14.0)
NEUT#: 1.6 10*3/uL (ref 1.5–6.5)
NEUT%: 48.2 % (ref 38.4–76.8)
Platelets: 385 10*3/uL (ref 145–400)
RBC: 3.28 10*6/uL — ABNORMAL LOW (ref 3.70–5.45)
RDW: 16.8 % — ABNORMAL HIGH (ref 11.2–14.5)
WBC: 3.2 10*3/uL — ABNORMAL LOW (ref 3.9–10.3)
lymph#: 1.1 10*3/uL (ref 0.9–3.3)
nRBC: 0 % (ref 0–0)

## 2011-01-01 LAB — COMPREHENSIVE METABOLIC PANEL
ALT: 25 U/L (ref 0–35)
AST: 22 U/L (ref 0–37)
Albumin: 4.2 g/dL (ref 3.5–5.2)
Alkaline Phosphatase: 96 U/L (ref 39–117)
BUN: 18 mg/dL (ref 6–23)
CO2: 24 mEq/L (ref 19–32)
Calcium: 9.4 mg/dL (ref 8.4–10.5)
Chloride: 107 mEq/L (ref 96–112)
Creatinine, Ser: 0.77 mg/dL (ref 0.40–1.20)
Glucose, Bld: 93 mg/dL (ref 70–99)
Potassium: 4.6 mEq/L (ref 3.5–5.3)
Sodium: 141 mEq/L (ref 135–145)
Total Bilirubin: 0.3 mg/dL (ref 0.3–1.2)
Total Protein: 6.2 g/dL (ref 6.0–8.3)

## 2011-01-08 ENCOUNTER — Encounter (HOSPITAL_BASED_OUTPATIENT_CLINIC_OR_DEPARTMENT_OTHER): Payer: Self-pay | Admitting: Oncology

## 2011-01-08 ENCOUNTER — Other Ambulatory Visit: Payer: Self-pay | Admitting: Oncology

## 2011-01-08 DIAGNOSIS — C50419 Malignant neoplasm of upper-outer quadrant of unspecified female breast: Secondary | ICD-10-CM

## 2011-01-08 DIAGNOSIS — Z170 Estrogen receptor positive status [ER+]: Secondary | ICD-10-CM

## 2011-01-08 LAB — CBC WITH DIFFERENTIAL/PLATELET
BASO%: 1.8 % (ref 0.0–2.0)
Basophils Absolute: 0 10*3/uL (ref 0.0–0.1)
EOS%: 3.5 % (ref 0.0–7.0)
Eosinophils Absolute: 0.1 10*3/uL (ref 0.0–0.5)
HCT: 32.2 % — ABNORMAL LOW (ref 34.8–46.6)
HGB: 10.3 g/dL — ABNORMAL LOW (ref 11.6–15.9)
LYMPH%: 40.1 % (ref 14.0–49.7)
MCH: 32.5 pg (ref 25.1–34.0)
MCHC: 32 g/dL (ref 31.5–36.0)
MCV: 101.6 fL — ABNORMAL HIGH (ref 79.5–101.0)
MONO#: 0.3 10*3/uL (ref 0.1–0.9)
MONO%: 12.8 % (ref 0.0–14.0)
NEUT#: 1 10*3/uL — ABNORMAL LOW (ref 1.5–6.5)
NEUT%: 41.8 % (ref 38.4–76.8)
Platelets: 351 10*3/uL (ref 145–400)
RBC: 3.17 10*6/uL — ABNORMAL LOW (ref 3.70–5.45)
RDW: 15.9 % — ABNORMAL HIGH (ref 11.2–14.5)
WBC: 2.3 10*3/uL — ABNORMAL LOW (ref 3.9–10.3)
lymph#: 0.9 10*3/uL (ref 0.9–3.3)
nRBC: 0 % (ref 0–0)

## 2011-01-15 ENCOUNTER — Other Ambulatory Visit: Payer: Self-pay | Admitting: Oncology

## 2011-01-15 ENCOUNTER — Encounter (HOSPITAL_BASED_OUTPATIENT_CLINIC_OR_DEPARTMENT_OTHER): Payer: Self-pay | Admitting: Oncology

## 2011-01-15 DIAGNOSIS — C50419 Malignant neoplasm of upper-outer quadrant of unspecified female breast: Secondary | ICD-10-CM

## 2011-01-15 DIAGNOSIS — C50911 Malignant neoplasm of unspecified site of right female breast: Secondary | ICD-10-CM

## 2011-01-15 DIAGNOSIS — Z170 Estrogen receptor positive status [ER+]: Secondary | ICD-10-CM

## 2011-01-15 LAB — CBC WITH DIFFERENTIAL/PLATELET
BASO%: 1.6 % (ref 0.0–2.0)
Basophils Absolute: 0.1 10*3/uL (ref 0.0–0.1)
EOS%: 3.8 % (ref 0.0–7.0)
Eosinophils Absolute: 0.1 10*3/uL (ref 0.0–0.5)
HCT: 35 % (ref 34.8–46.6)
HGB: 11.4 g/dL — ABNORMAL LOW (ref 11.6–15.9)
LYMPH%: 42.8 % (ref 14.0–49.7)
MCH: 32.6 pg (ref 25.1–34.0)
MCHC: 32.6 g/dL (ref 31.5–36.0)
MCV: 100 fL (ref 79.5–101.0)
MONO#: 0.6 10*3/uL (ref 0.1–0.9)
MONO%: 20 % — ABNORMAL HIGH (ref 0.0–14.0)
NEUT#: 1 10*3/uL — ABNORMAL LOW (ref 1.5–6.5)
NEUT%: 31.8 % — ABNORMAL LOW (ref 38.4–76.8)
Platelets: 381 10*3/uL (ref 145–400)
RBC: 3.5 10*6/uL — ABNORMAL LOW (ref 3.70–5.45)
RDW: 14.8 % — ABNORMAL HIGH (ref 11.2–14.5)
WBC: 3.2 10*3/uL — ABNORMAL LOW (ref 3.9–10.3)
lymph#: 1.4 10*3/uL (ref 0.9–3.3)

## 2011-01-16 ENCOUNTER — Other Ambulatory Visit (HOSPITAL_COMMUNITY): Payer: Self-pay | Admitting: General Surgery

## 2011-01-16 DIAGNOSIS — C50911 Malignant neoplasm of unspecified site of right female breast: Secondary | ICD-10-CM

## 2011-01-19 ENCOUNTER — Ambulatory Visit: Payer: No Typology Code available for payment source | Admitting: Gastroenterology

## 2011-01-20 ENCOUNTER — Ambulatory Visit
Admission: RE | Admit: 2011-01-20 | Discharge: 2011-01-20 | Disposition: A | Discharge status: Home / Self Care | Payer: Self-pay | Source: Ambulatory Visit | Attending: Oncology | Admitting: Oncology

## 2011-01-20 DIAGNOSIS — C50911 Malignant neoplasm of unspecified site of right female breast: Secondary | ICD-10-CM

## 2011-01-20 MED ORDER — GADOBENATE DIMEGLUMINE 529 MG/ML IV SOLN
12.0000 mL | Freq: Once | INTRAVENOUS | Status: AC | PRN
  Administered 2011-01-20: 12 mL via INTRAVENOUS

## 2011-01-23 NOTE — Op Note (Signed)
[facility]  Patient:    [name]                     MRN: [id number] Proc. Date: 09/16/99 Adm. 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 Follow-up D&C previous polyps.  POSTOPERATIVE DIAGNOSIS:  OPERATION:  SURGEON:  Esmeralda Arthur, M.D.  ANESTHESIA:  General.  PACKS:  None.  CATHETERS:  None.  ____________  FINDINGS:  Endometrium is not atrophic. She is on hormone replacement therapy.  There is a questionable polyp at the time of curettage.  DESCRIPTION OF PROCEDURE:  The patient was carried to the operating room. After satisfactory general anesthesia, patient in a lithotomy position, prepped and draped in sterile field.  The observation camera was hooked up and after prepping the patient, the bladder was emptied by catheterization.  The weighted speculum was placed in the posterior vagina, cervix grasped with a tenaculum and the uterus sounded to 8 cm.  The cervix was then dilated with Homero Fellers dilators to a #18.  Observation scope was placed in and we could see endometrial tissue present.  I could not get a good distention of he uterus because I think the Frank at 18 is too big for the scope.  I could not see any gross polyps, could not see any growths.  I could see the endometrium growth.  We then removed the scope and did a curettage and then cervical curettage and a little tissue was obtained.  On the left lateral wall she may have had a polyp under curettage.  We got some  tissue which may be a small polyp.  The uterus being well curetted, the procedure was terminated.  The patient was carried to the recovery room in good condition. DD:  09/16/99 TD:  09/16/99 Job: [id number]

## 2011-01-23 NOTE — Op Note (Signed)
[name], [name]             ACCOUNT NO.:  [id number]   MEDICAL RECORD NO.:  [id number]          PATIENT TYPE:  AMB   LOCATION:  DSC                          FACILITY:  [facility]   PHYSICIAN:  Katy Fitch. Sypher, M.D. 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   OPERATION:  Arthrodesis left index finger DIP joint with local bone graft  harvested from the condyles of the middle phalanx followed by 0.035-inch  Kirschner wire fixation x2.   OPERATION SURGEON:  Josephine Igo, M.D.   ASSISTANT:  Molly Maduro Dasnoit PA-C.   ANESTHESIA:  General by LMA.   SUPERVISING ANESTHESIOLOGIST:  Dr. Jairo Ben.   INDICATIONS:  [name] is a 65 year old woman referred through the  courtesy of Dr. Richardson Landry for evaluation and management of a painful and  deformed left index finger DIP joint.   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   During an informed consent in the office we advised her to proceed with  osteophyte excision, synovectomy and arthrodesis of her left index finger  DIP joint with a primary indication pain relief, secondary indication  improvement of grasp and pinch.   She understands that we cannot guarantee arthrodesis of these joints.  Typically 90% of our DIP arthrodesis efforts will be successful in the first  attempt.  Oftentimes a secondary bone graft and further internal fixation is  required.   After informed consent, she is brought to the operating room at this time.   PROCEDURE:  [name] is brought to the operating room and placed in  the  supine position on the operating room table.   Following the induction of general anesthesia by LMA, the left arm was  prepped with Betadine soap solution and sterilely draped.  A pneumatic  tourniquet was applied proximal to the left brachium.   Following exsanguination of the left arm with an Esmarch bandage the  arterial tourniquet was inflated to 220 mmHg.  The procedure commenced with  a paired Mercedes incision exposing the extensor mechanism.  The dorsal  cutaneous nerve branches were retracted radial and ulnar and the dorsal  veins electrocauterized and transected.   The extensor mechanism was transected 3 mm proximal to its insertion at the  distal phalanx dorsally.  The collateral ligaments were released proximally  and the joint opened shotgun style.   A meticulous debridement of marginal osteophytes was accomplished with a  micro rongeur and a micro curette.   A synovectomy of the joint was accomplished.   We subsequently created a cup and cone type arthrodesis utilizing a 2.5 mm  round bur and a rongeur to fashion a proper shape to the middle phalangeal  condyles.   All bone removed by contouring of the middle phalanx was saved  for bone  graft.   The bone graft was placed between the middle and distal phalanges followed  by impaction and placement of two 0.035-inch Kirschner wires across the DIP  joint.   The joint was maintained in the position of neutral deviation, approximately  5 degrees of supination to facilitate pulp to pulp pinch with the thumb and  approximately 10 degrees flexion.   The wires were driven into the distal phalanx followed by AP lateral C-arm  images confirming satisfactory position of the Kirschner wires.   The wound was then irrigated and repaired with mattress suture of 4-0 Vicryl  repairing the extensor mechanism anatomically and repair of the skin with  corner sutures of 5-0 nylon and mattress suture of 5-0 nylon.   The wound was  then dressed with Seraflo collodion and the pins were repaired  in standard manner with Seraflo dressings.   A finger dressing was applied with Seraflo, sterile gauze, Coban, and an  Alumafoam protective splint.   There no apparent complications.   For aftercare Ms. Egelhoff is provided prescription for Percocet 5 mg, one  p.o. for 4-6 hours p.r.n. pain, 30 tablets without refill.  Also, Keflex 500  mg one p.o. q.8 h x4 days as a prophylactic antibiotic.   She is encouraged to use over-the-counter analgesics in the form of  ibuprofen or Aleve.      Katy Fitch Sypher, M.D.  Electronically Signed     RVS/MEDQ  D:  06/10/2006  T:  06/11/2006  Job:  [id number]   cc:   Loreta Ave, M.D.  Katy Fitch Sypher, M.D.

## 2011-01-23 NOTE — Op Note (Signed)
NAMEELEONORE, [name]             ACCOUNT NO.:  [id number]   MEDICAL RECORD NO.:  [id number]          PATIENT TYPE:  AMB   LOCATION:  DSC                          FACILITY:  [facility]   PHYSICIAN:  Katy Fitch. Sypher, M.D. 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   Ms. [name] had a very smooth postoperative course except for a  significant degree of pain following surgery; however, on August 11, 2006 upon return to the office, it was noted that she had a loss of the  position of flexion selected at the time of surgery, suggesting the  onset of a fibrous union.   From August 11, 2006 until this time, we have tried a series of  protective splints in an effort to allow the arthrodesis to complete its  union.   Due to a failure to obtain a stable pain free union and given the fact  that false motion has been demonstrated on multiple occasions, I have  advised Ms. [name] to return to the operating room at this time for a  second attempt at arthrodesis.   Preoperatively, we had a very detailed informed consent in which I  advised her that in my judgment we should proceed with implanted  hardware including 0.028-inch Kirschner wires and 28-gauge tension band  wire technique.   Our choice not to employ these means at the time of the first  arthrodesis as that one can often face complications from implanted  hardware at a later date given the thin skin coverage of the DIP joint.  Indeed, we have a more than 90% success rate with simple Kirschner wire  fixation or other constructs that are removed.  However, given her  current predicament, I have advised a more complex internal fixation  scheme that will be left implanted with a full realization that we could  have a hardware complication at a later date that would require removal.  In addition, given the fact that we had a period of inflammation of the  wound after the apparent fibrous union began, due to some splinting  challenges, I advised Ms. [name] that we will proceed with culture of  the bone at this time to be absolutely certain that there was no chance  of sepsis developing during our splint challenges.   Both [name] and her husband have been informed that we will proceed on a  best efforts basis.  They understand from our original preoperative  conversation that arthrodesis cannot be guaranteed; however, with  permanently implanted rigid internal fixation, the chance of success  with a second effort should be enhanced.   Questions were invited and answered preoperatively in  detail.  We now  proceed for a secondary effort at left index finger DIP arthrodesis.   PROCEDURE:  [name] is brought to the operating room and placed in  supine position on the operating table.  Following an anesthesia consult  with Dr. Sampson Goon, general anesthesia by LMA was recommended and  accepted.   [name] was brought to Room 1, placed in the supine position on the  operating table, and under Dr. Jarrett Ables direct supervision, general  anesthesia by LMA technique induced.   Her left arm was prepped with Betadine soap solution an d sterilely  draped.  One gram of Ancef was administered as an IV prophylactic  antibiotic.   The proper surgical site identification protocol was followed, and Ms.  [name] was noted to have no drug allergies.  We withheld antibiotics  pending a bone culture.   The procedure  commenced with exsanguination of her left arm with an  Esmarch bandage and inflation of the arterial tourniquet on the proximal  brachium to 220 mmHg.  The procedure commenced with partial excision of  the prior surgical scar and exposure of the PIP joint region with double  Mercedes incisions.  The incisions were taken directly to bone and full-  thickness flaps elevated to prevent possible skin margin necrosis.  A  portion of hypertrophic scar along the wound margin was resected, and a  small hypertrophic scar at the site of a prior pin track was also  excised.  The DIP joint was exposed, and there was noted be false motion  from a resting position of 15-degrees flexion to further flexion to 30  degrees.  This was clearly a partial fibrous union/pseudoarthrosis.   After release of the re-forming collateral ligaments, the joint was  opened shotgun style, and a fine rongeur was used to harvest the  cartilaginous and osseous surfaces of both the middle phalanx and distal  phalanx.  These were placed in a sterile specimen container for  transport and sent to the lab for aerobic bone culture.   The adjacent surfaces of the middle phalanx and distal phalanx were then  prepared by hand with a rongeur and a microcurette.  Care was taken not  to utilize heat generating power tools.   After proper cup and cone arthrodesis was achieved, the joint was  positioned in approximately 10-degrees flexion, and scrapings of bone  from the proximal portion of the middle phalangeal head were used to  create an interpositional cancellus bone graft.   Two 0.028-inch Kirschner wires were placed on variable angles with  retrograde technique to provide optimum rotational control.  Drill holes  were created in the distal and middle phalanges, and a 28-gauge through-  bone tension band was placed initially and tensioned appropriately, securing the joint at approximately 5 degrees of flexion and slight   supination.  AP lateral C-arm images were obtained prior to driving the  K-wires.   With a satisfactory position being achieved, we then drove the K-wires  to the distal phalangeal cortex using fluoroscopic x-ray control  throughout wire passage.  The second tension band was then brought  proximally around the pins and used to create a variable angle tension  band to augment compression.   The Kirschner wires were sized, appropriately cut, bent and buried, and  the wires were twisted to a proper tension, cut, bent and buried  followed by irrigation of the wound.   Final AP and lateral images of the finger demonstrated that  we achieved  a flexion posture of approximately 5-degrees, slight supination and a  neutral radial ulnar deviation angle.  Excellent compression of the  joint surfaces was achieved.   After thorough lavage of the wound, the skin was repaired with trauma  sutures of 5-0 nylon and Steri-Strips.  The finger was dressed with  sterile gauze, Coban and a protective dorsal Alumafoam splint.   It should be noted after bone cultures were obtained 1 gram of Ancef was  administered as an IV prophylactic antibiotic.   For aftercare, Ms. Micheli is advised to elevate her hand strictly for  the next 7 days and will use Dilaudid 2 mg 1 p.o. q.4-6h. p.r.n. pain 30  tablets without refill and Aleve and Tylenol as needed.  She is also  provided doxycycline 100 mg 1 p.o. b.i.d. for 7 days as a prophylactic  antibiotic to protect her hardware.   There were no apparent complications.   The tourniquet was released with immediate capillary refill to the  fingers and thumb.  Ms. Berenguer was awakened from general anesthesia,  transferred to the recovery room with stable signs and will be  discharged home to the care of her husband later this afternoon.      Katy Fitch Sypher, M.D.  Electronically Signed     RVS/MEDQ  D:  11/19/2006  T:  11/20/2006  Job:  [id number]   cc:    Loreta Ave, M.D.  Katy Fitch Sypher, M.D.

## 2011-02-26 ENCOUNTER — Encounter (HOSPITAL_COMMUNITY)
Admission: RE | Admit: 2011-02-26 | Discharge: 2011-02-26 | Disposition: A | Discharge status: Home / Self Care | Payer: No Typology Code available for payment source | Source: Ambulatory Visit | Attending: General Surgery | Admitting: General Surgery

## 2011-02-26 ENCOUNTER — Ambulatory Visit (HOSPITAL_COMMUNITY)
Admission: RE | Admit: 2011-02-26 | Discharge: 2011-02-26 | Disposition: A | Discharge status: Home / Self Care | Payer: No Typology Code available for payment source | Source: Ambulatory Visit | Attending: General Surgery | Admitting: General Surgery

## 2011-02-26 ENCOUNTER — Other Ambulatory Visit (INDEPENDENT_AMBULATORY_CARE_PROVIDER_SITE_OTHER): Payer: Self-pay | Admitting: General Surgery

## 2011-02-26 DIAGNOSIS — C50919 Malignant neoplasm of unspecified site of unspecified female breast: Secondary | ICD-10-CM | POA: Insufficient documentation

## 2011-02-26 DIAGNOSIS — Z01818 Encounter for other preprocedural examination: Secondary | ICD-10-CM | POA: Insufficient documentation

## 2011-02-26 DIAGNOSIS — C50911 Malignant neoplasm of unspecified site of right female breast: Secondary | ICD-10-CM

## 2011-02-26 DIAGNOSIS — F17200 Nicotine dependence, unspecified, uncomplicated: Secondary | ICD-10-CM | POA: Insufficient documentation

## 2011-02-26 DIAGNOSIS — Z01812 Encounter for preprocedural laboratory examination: Secondary | ICD-10-CM | POA: Insufficient documentation

## 2011-02-26 LAB — CBC
HCT: 39.6 % (ref 36.0–46.0)
Hemoglobin: 13.3 g/dL (ref 12.0–15.0)
MCH: 31.5 pg (ref 26.0–34.0)
MCHC: 33.6 g/dL (ref 30.0–36.0)
MCV: 93.8 fL (ref 78.0–100.0)
Platelets: 266 10*3/uL (ref 150–400)
RBC: 4.22 MIL/uL (ref 3.87–5.11)
RDW: 13.7 % (ref 11.5–15.5)
WBC: 5.1 10*3/uL (ref 4.0–10.5)

## 2011-02-26 LAB — SURGICAL PCR SCREEN
MRSA, PCR: NEGATIVE
Staphylococcus aureus: NEGATIVE

## 2011-02-26 LAB — DIFFERENTIAL
Basophils Absolute: 0.1 10*3/uL (ref 0.0–0.1)
Basophils Relative: 1 % (ref 0–1)
Eosinophils Absolute: 0.2 10*3/uL (ref 0.0–0.7)
Eosinophils Relative: 4 % (ref 0–5)
Lymphocytes Relative: 31 % (ref 12–46)
Lymphs Abs: 1.6 10*3/uL (ref 0.7–4.0)
Monocytes Absolute: 0.8 10*3/uL (ref 0.1–1.0)
Monocytes Relative: 16 % — ABNORMAL HIGH (ref 3–12)
Neutro Abs: 2.4 10*3/uL (ref 1.7–7.7)
Neutrophils Relative %: 48 % (ref 43–77)

## 2011-02-26 LAB — BASIC METABOLIC PANEL
BUN: 24 mg/dL — ABNORMAL HIGH (ref 6–23)
CO2: 28 mEq/L (ref 19–32)
Calcium: 9.3 mg/dL (ref 8.4–10.5)
Chloride: 104 mEq/L (ref 96–112)
Creatinine, Ser: 0.83 mg/dL (ref 0.50–1.10)
GFR calc Af Amer: >60 mL/min (ref >60)
GFR calc non Af Amer: >60 mL/min (ref >60)
Glucose, Bld: 67 mg/dL — ABNORMAL LOW (ref 70–99)
Potassium: 4.7 mEq/L (ref 3.5–5.1)
Sodium: 140 mEq/L (ref 135–145)

## 2011-03-03 ENCOUNTER — Other Ambulatory Visit (INDEPENDENT_AMBULATORY_CARE_PROVIDER_SITE_OTHER): Payer: Self-pay | Admitting: General Surgery

## 2011-03-03 ENCOUNTER — Ambulatory Visit (HOSPITAL_COMMUNITY)
Admission: RE | Admit: 2011-03-03 | Discharge: 2011-03-03 | Disposition: A | Discharge status: Home / Self Care | Payer: 59 | Source: Ambulatory Visit | Attending: General Surgery | Admitting: General Surgery

## 2011-03-03 ENCOUNTER — Inpatient Hospital Stay (HOSPITAL_COMMUNITY)
Admission: RE | Admit: 2011-03-03 | Discharge: 2011-03-06 | DRG: 580 | Disposition: A | Discharge status: Home / Self Care | Payer: 59 | Source: Ambulatory Visit | Attending: General Surgery | Admitting: General Surgery

## 2011-03-03 DIAGNOSIS — Z01818 Encounter for other preprocedural examination: Secondary | ICD-10-CM

## 2011-03-03 DIAGNOSIS — C50911 Malignant neoplasm of unspecified site of right female breast: Secondary | ICD-10-CM

## 2011-03-03 DIAGNOSIS — Z01812 Encounter for preprocedural laboratory examination: Secondary | ICD-10-CM

## 2011-03-03 DIAGNOSIS — R5082 Postprocedural fever: Secondary | ICD-10-CM | POA: Diagnosis present

## 2011-03-03 DIAGNOSIS — C50919 Malignant neoplasm of unspecified site of unspecified female breast: Secondary | ICD-10-CM

## 2011-03-03 DIAGNOSIS — M129 Arthropathy, unspecified: Secondary | ICD-10-CM | POA: Diagnosis present

## 2011-03-03 DIAGNOSIS — J90 Pleural effusion, not elsewhere classified: Secondary | ICD-10-CM | POA: Diagnosis present

## 2011-03-03 DIAGNOSIS — F17200 Nicotine dependence, unspecified, uncomplicated: Secondary | ICD-10-CM | POA: Diagnosis present

## 2011-03-03 MED ORDER — TECHNETIUM TC 99M SULFUR COLLOID FILTERED
1.0000 | Freq: Once | INTRAVENOUS | Status: AC | PRN
  Administered 2011-03-03: 1 via INTRADERMAL

## 2011-03-04 ENCOUNTER — Inpatient Hospital Stay (HOSPITAL_COMMUNITY): Payer: 59

## 2011-03-04 LAB — CBC
HCT: 32.9 % — ABNORMAL LOW (ref 36.0–46.0)
Hemoglobin: 10.9 g/dL — ABNORMAL LOW (ref 12.0–15.0)
MCH: 30.9 pg (ref 26.0–34.0)
MCHC: 33.1 g/dL (ref 30.0–36.0)
MCV: 93.2 fL (ref 78.0–100.0)
Platelets: 179 10*3/uL (ref 150–400)
RBC: 3.53 MIL/uL — ABNORMAL LOW (ref 3.87–5.11)
RDW: 14.1 % (ref 11.5–15.5)
WBC: 7.9 10*3/uL (ref 4.0–10.5)

## 2011-03-06 ENCOUNTER — Inpatient Hospital Stay (HOSPITAL_COMMUNITY): Payer: 59

## 2011-03-10 NOTE — Op Note (Signed)
[name], [name] NO.:  [id number]  MEDICAL RECORD NO.:  [id number]  LOCATION:                                 FACILITY:  PHYSICIAN:  Ollen Gross. Vernell Morgans, M.D. DATE OF BIRTH:  1945-10-04  DATE OF PROCEDURE:  03/03/2011 DATE OF DISCHARGE:                              OPERATIVE REPORT   PREOPERATIVE DIAGNOSIS:  Right stage II breast cancer.  POSTOPERATIVE DIAGNOSIS:  Right stage II breast cancer.  PROCEDURE:  Right mastectomy and sentinel node biopsy x2 with injection of blue dye.  SURGEON:  Ollen Gross. Vernell Morgans, MD  ASSISTANT:  Adolph Pollack, MD  ANESTHESIA:  General endotracheal.  PROCEDURE:  After informed consent was obtained, the patient was brought to the operating room, placed in supine position on the operating table. After induction of general anesthesia, the patient's chest and axillary breast area were all prepped with ChloraPrep, allowed to dry and draped in usual sterile manner.  The patient had received neoadjuvant chemotherapy to shrink the tumor and at this point she was ready for definitive surgery.  A skin sparing incision was made around the nipple- areolar complex with a 10 blade knife on the right side. Prior to doing this, 2 mL of methylene blue and 3 mL of injectable saline were injected in the subareolar position and the breast was massaged for several minutes earlier in the day. The patient had undergone injection of 1 mCi of technetium sulfur colloid also in the subareolar position.  There were 2 hot spots of the right axilla that were marked with a Neoprobe. At this point, the skin sparing incision was made on the right breast to just around the nipple and areola and extending the incision medially and laterally.  The incision was carried down through the rest of the subcutaneous skin and subcutaneous tissue sharply with the electrocautery.  Once into the breast tissue, skin hooks were used to elevate the skin flaps  anteriorly.  Thin skin flaps were created circumferentially around the incision using the Harmonic blade.  This dissection was carried all the way down to the chest wall. Unfortunately I think because of the previous augmentation, her plane between her breast tissue and subcutaneous fatty tissue was very thin and her skin flaps were very thin. She also had some significant thinning of her underlying muscle of her chest wall.  The dissection superiorly and inferiorly and medially were carried down to the chest wall laterally.  The dissection was carried down to the latissimus muscle.  Once this was accomplished in the breast, it was removed from the chest wall with the pectoralis fascia.  This was done sharply with electrocautery.  Inferiorly there was not much muscle coverage to her implant and the breast tissue was fused to the implant fairly well, so some of the capsule around the implant was removed inferiorly with the specimen.  Once the dissection was carried out laterally into the axilla using the Neoprobe to direct the dissection, we used a blunt tonsil dissection to find 2 hot blue lymph nodes.  Ex vivo counts on these were about 2000, 2000, 1000 respectively.  These were sent as sentinel node #1 and 2.  No other hot blue or palpable lymph nodes were identified in the right axilla.  The rest of the breast tissue was removed from the chest wall within those boundaries and once this was accomplished, this specimen was marked with a stitch on the lateral aspect and sent to Pathology for further evaluation.  Hemostasis was achieved using the Bovie electrocautery.  Wound was irrigated with copious amounts of saline and packed with a moist gauze.  At this point, touch preps on the sentinel nodes were negative and at this point we turned the case over to Dr. Odis Luster for the reconstructive portion.  His portion of the case will be dictated separately.  The patient was in stable  condition and all needle, sponge, and instrument counts were correct.     Ollen Gross. Vernell Morgans, M.D.     PST/MEDQ  D:  03/09/2011  T:  03/09/2011  Job:  [id number]  Electronically Signed by Chevis Pretty III M.D. on 03/10/2011 09:19:09 AM

## 2011-03-13 ENCOUNTER — Ambulatory Visit (HOSPITAL_BASED_OUTPATIENT_CLINIC_OR_DEPARTMENT_OTHER)
Admission: RE | Admit: 2011-03-13 | Discharge: 2011-03-13 | Disposition: A | Discharge status: Home / Self Care | Payer: Medicare Other | Source: Ambulatory Visit | Attending: Plastic Surgery | Admitting: Plastic Surgery

## 2011-03-13 DIAGNOSIS — Z01812 Encounter for preprocedural laboratory examination: Secondary | ICD-10-CM | POA: Insufficient documentation

## 2011-03-13 DIAGNOSIS — Z9010 Acquired absence of unspecified breast and nipple: Secondary | ICD-10-CM | POA: Insufficient documentation

## 2011-03-13 DIAGNOSIS — T8549XA Other mechanical complication of breast prosthesis and implant, initial encounter: Secondary | ICD-10-CM | POA: Insufficient documentation

## 2011-03-13 DIAGNOSIS — Y836 Removal of other organ (partial) (total) as the cause of abnormal reaction of the patient, or of later complication, without mention of misadventure at the time of the procedure: Secondary | ICD-10-CM | POA: Insufficient documentation

## 2011-03-13 HISTORY — PX: BREAST SURGERY: SHX581

## 2011-03-16 LAB — POCT HEMOGLOBIN-HEMACUE: Hemoglobin: 13.4 g/dL (ref 12.0–15.0)

## 2011-03-16 NOTE — Op Note (Signed)
  NAMEMAELEE, [name] NO.:  [id number]  MEDICAL RECORD NO.:  [id number]  LOCATION:  XRAY                         FACILITY:  [facility]  PHYSICIAN:  Etter Sjogren, M.D.     DATE OF BIRTH:  1945-12-27  DATE OF PROCEDURE:  03/13/2011 DATE OF DISCHARGE:  02/26/2011                              OPERATIVE REPORT   PREOPERATIVE DIAGNOSES:  Ischemic necrosis of skin, superior mastectomy flap right chest.  POSTOPERATIVE DIAGNOSES:  Ischemic necrosis of skin, superior mastectomy flap right chest with complicated open wound right chest.  PROCEDURE PERFORMED: 1. Debridement of necrotic skin, superior mastectomy flap right chest. 2. Rearrangement of tissues greater than a centimeter squared to close     complex wound right chest.  SURGEON:  Etter Sjogren, MD  ANESTHESIA:  MAC with 0.25% Marcaine plain local.  CLINICAL NOTE:  A 65 year old woman who has had a mastectomy and had reconstruction with tissue expander and HD Flex.  That was performed couple of weeks ago.  She has developed some necrotic skin along the incision line and with the underlying [facility] Flex, it is felt that this needs to be removed.  The nature of procedure and risks discussed with her and she understood, risks possibly that she might lose her tissue expander and wished to proceed.  DESCRIPTION OF PROCEDURE:  The patient was taken to the operating room and placed supine.  Under satisfactory sedation, she was prepped with ChloraPrep and after waiting full 3 minutes for drying, she was draped with sterile drapes.  Marcaine 0.25% plain was used and the debridement was performed.  The skin flap edges were advanced and the skin edges had excellent bleeding along their border consistent with viability. Antibiotic solution was used to thoroughly irrigate.  The underlying HD Flex was in excellent condition and looked very healthy.  A 25-gauge needle was passed through the HD Flex into the tissue expander and  the remaining 25 mL was removed from the tissue expander in order to try to take all the tension away from the skin closure, and skin closure was achieved with 3-0 Prolene interrupted simple and vertical mattress sutures.  There did not appear to be any vascular compromise of the skin with this closure.  She tolerated the procedure well.  Antibiotic ointment, Xeroform gauze, dry sterile dressing was lightly placed and she was transferred to the recovery room stable and tolerated the procedure well.  DISPOSITION:  Recheck in the office next week.  She will be on antibiotics 4 times a day.     Etter Sjogren, M.D.     DB/MEDQ  D:  03/13/2011  T:  03/14/2011  Job:  [id number]  Electronically Signed by Etter Sjogren M.D. on 03/16/2011 08:40:46 AM

## 2011-03-16 NOTE — Op Note (Signed)
[name], [name] NO.:  [id number]  MEDICAL RECORD NO.:  [id number]  LOCATION:                                 FACILITY:  PHYSICIAN:  Etter Sjogren, M.D.     DATE OF BIRTH:  03-Sep-1946  DATE OF PROCEDURE:  03/03/2011 DATE OF DISCHARGE:                              OPERATIVE REPORT   PREOPERATIVE DIAGNOSIS:  Right breast cancer.  POSTOPERATIVE DIAGNOSIS:  Right breast cancer.  PROCEDURES PERFORMED: 1. Right breast reconstruction with tissue expander. 2. Placement of acellular dermal matrix greater than 100 cm2 as a     distinct procedure for right breast reconstruction.  SURGEON:  Etter Sjogren, MD  ANESTHESIA:  General.  ESTIMATED BLOOD LOSS:  20 mL.  Two 19-French drains were left.  CLINICAL NOTE:  A 65 year old woman has breast cancer and has implants in place and desires reconstruction.  Options discussed.  She desired immediate reconstruction with an implant, but she understood that it may not be possible.  She understood that we would then place a tissue expander and she was fine with that and she also understood that very likely we would need to use acellular dermal matrix (HDFlex).  She understood the reasoning for that, that the muscle would be short because of the previously placed submuscular implants.  Other risks and possible complications of the surgery were discussed including but not limited to bleeding, infection, anesthesia complications, healing problems, scarring, fluid accumulation, loss of sensation, asymmetry, disappointment, pneumothorax, pulmonary embolism, failure of device, capsular contracture, displacement of device, wrinkles, ripples, secondary surgeries, loss of mastectomy skin and she stood all of this and wished to proceed.  DESCRIPTION:  The mastectomy was completed.  This had been done through a skin sparing mastectomy but the skin was very thin.  On inspection, there was some question of viability at the very  distal aspect and skin was trimmed little more superiorly, then inferiorly because of the color and it was trimmed back to bright red bleeding.  Thorough irrigation with saline and the serratus muscle was lifted laterally.  There was a fairly large area from the end of the pectoralis muscle down to the chest wall where HDFlex remained to be used.  The hemostasis with electrocautery, irrigation with antibiotic solution.  The expander was soaked in antibiotic solution and after thoroughly cleaning gloves, it was prepared placing 100 mL of sterile saline using a closed filling system and then removing all of the air and then removing 75 mL down to a total of 25 mL of sterile saline in the expander.  The expander was soaked in antibiotic solution for greater than 5 minutes, as was the HDFlex also soaked in antibiotic solution.  The 19-French drains were positioned and brought through separate stab wounds inferolaterally and secured with 3-0 Prolene sutures.  The expander was positioned.  The HDflex was then placed on top of it with the epidermal side towards the expander and the dermal side facing up towards the mastectomy flaps. The HDFlex was inset using a 3-0 PDS running simple suture and antibiotic solution was placed in around the expander prior to placing the last suture in the HDFlex.  Great care was  taken to avoid damage to the underlying expander which was kept always under direct vision at all times.  The skin was then redraped and the skin closure with 3-0 Monocryl interrupted inverted deep dermal sutures and the skin edges appeared to be viable.  Appeared to have a good color with bright red bleeding at the periphery consistent with viability.  Dermabond was also used to complete the closure and antibiotic ointment was placed around the drain sites and dry sterile dressings placed there and she was covered with dry sterile dressing and was transferred to the recovery room stable,  having tolerated the procedure well.     Etter Sjogren, M.D.     DB/MEDQ  D:  03/03/2011  T:  03/04/2011  Job:  [id number]  Electronically Signed by Etter Sjogren M.D. on 03/16/2011 08:41:18 AM

## 2011-03-16 NOTE — Discharge Summary (Signed)
[name], [name] NO.:  [id number]  MEDICAL RECORD NO.:  [id number]  LOCATION:  NUC                          FACILITY:  [facility]  PHYSICIAN:  Etter Sjogren, M.D.     DATE OF BIRTH:  August 01, 1946  DATE OF ADMISSION:  03/03/2011 DATE OF DISCHARGE:  03/06/2011                              DISCHARGE SUMMARY   FINAL DIAGNOSES: 1. Breast cancer. 2. Atelectasis, left lung.  HISTORY OF PRESENT ILLNESS:  A 65 year old woman has breast cancer right side, has previous augmentation.  She desired breast reconstruction. Options discussed.  She will either have an immediate placement implant or a tissue expander.  She is a smoker.  She understood that there was increased risk for surgeries, especially reconstruction that she is smoking up to the time of surgery.  The risks including not limited to bleeding, infection, anesthesia complications, healing problems, scarring, displacement of device, loss of device, capsular contracture, wrinkles, ripples, failure of the device, pneumothorax, coronary embolism, and she understood all of this and wished to proceed.  For further details of the history and physical, please see the chart.  COURSE IN THE HOSPITAL:  On admission, she was taken to surgery at which time the right mastectomy was performed.  Reconstruction was performed using HDFlex and tissue expander.  Postoperatively, she had a fairly high fever on the first day up over 102 degrees.  Chest x-ray revealed some opacity in the left lower lobe with a little pleural effusion. This responded to ambulation, incentive spirometry, and aerosol treatment.  Chest x-ray performed at the time of discharge shows clear improvement.  She continues on antibiotics and she has had some epidermolysis of the skin, especially with superior mastectomy flap.  It is difficult to say how this will evolve and demarcate.  May demarcate to full-thickness loss.  At the present time, there is not any  evidence of full-thickness loss and there is no evidence of infection.  Drains are functioning.  Drainage is thin and appropriate green color, serosanguineous.  It is felt at this point she is ready to be discharged.  DISPOSITION:  She will be discharged on Percocet 5 mg total of 40 1-2 p.o. q.6 h. p.r.n. for pain, Robaxin 1000 mg 1 p.o. q. 12.  Also recommended to take Colace 100 mg twice a day as over-the-counter, Keflex 250 mg 4 times a day for 3 days and then twice a day until the drains have been removed.  No lifting.  No exercising.  No shower yet.  Empty the drains 3 times a day and record the amounts and bring that with her to the office.  In addition, she clearly understands 3 main points about her care. 1. She must not smoke not even one cigarette, she increases her risk     of pneumonia and she increases the risk of skin loss at the     surgical site and we labored that point this morning and she     understands that very clearly. 2. She is to follow up with her primary physician, Dr. Earl Gala or one     of his partners.  They would treat the pneumonia if he develops. It  is unclear as to whether or not this is a pneumonia or if is just     some atelectasis.  If she has any problems scheduling to see him or     one of his partners on that day, she needs to call me and I will     personally seek for her on behalf after seeing them.  Appropriately     fine with switch of her antibiotics if Dr. Earl Gala feels that would     be more appropriate if she does think that she is beginning to     develop a pneumonia.  Finally, I am uncertain about the skin on the     chest.  She may end up with full-thickness skin loss.  If so, we     would need to reoperate.  She understands that.  We remove the     expander and skin graft as needed or go to a latissimus flap with     implant.  It remains to be seeing as far as deciding about     reoperation, it would depend upon the demarcation  of the skin and     if it does go on a full-thickness loss.  She will see me in the     office on Tuesday and the skin should be clearly demarcated at that     time.  If any problems before that, she is to call.     Etter Sjogren, M.D.     DB/MEDQ  D:  03/06/2011  T:  03/07/2011  Job:  [id number]  Electronically Signed by Etter Sjogren M.D. on 03/16/2011 08:40:38 AM

## 2011-03-23 ENCOUNTER — Encounter (INDEPENDENT_AMBULATORY_CARE_PROVIDER_SITE_OTHER): Payer: Self-pay | Admitting: General Surgery

## 2011-03-23 ENCOUNTER — Encounter (INDEPENDENT_AMBULATORY_CARE_PROVIDER_SITE_OTHER): Payer: Medicare Other | Admitting: General Surgery

## 2011-03-23 ENCOUNTER — Ambulatory Visit (INDEPENDENT_AMBULATORY_CARE_PROVIDER_SITE_OTHER): Payer: Medicare Other | Admitting: General Surgery

## 2011-03-23 DIAGNOSIS — C50411 Malignant neoplasm of upper-outer quadrant of right female breast: Secondary | ICD-10-CM | POA: Insufficient documentation

## 2011-03-23 DIAGNOSIS — C50919 Malignant neoplasm of unspecified site of unspecified female breast: Secondary | ICD-10-CM

## 2011-03-26 ENCOUNTER — Encounter (INDEPENDENT_AMBULATORY_CARE_PROVIDER_SITE_OTHER): Payer: Self-pay | Admitting: General Surgery

## 2011-03-26 NOTE — Progress Notes (Signed)
Subjective:     Patient ID: [name], female   DOB: 1945/11/13, 65 y.o.   MRN: [id number]  HPI The patient is a 65 year old white female who is now at least a couple weeks out from a Right mastectomy and sentinel node biopsy for a T1 C. N0 right breast cancer. Her postoperative course was complicated by some necrosis of the superior skin flap. Dr. Odis Luster took her back to the operating room to re\re excise this area and reclose it. She is doing well now. She has no complaints. Her drains are out.  Review of Systems     Objective:   Physical Exam On exam her right mastectomy incision appears to be healing nicely. Her tissue expander is in place. Her skin  appears to be healthy. There is no evidence of infection or seroma.   Assessment:     2 weeks status post right mastectomy and negative sentinel node biopsy.    Plan:     Overall she is doing well. Dr. Odis Luster has done a nice job with her reconstruction. We will plan to see her back in another month to check her progress. She will continue to followup with both Dr. Odis Luster and her oncologist. She is to call me if she has any problems in the meantime.

## 2011-04-01 ENCOUNTER — Other Ambulatory Visit: Payer: Self-pay | Admitting: Oncology

## 2011-04-01 ENCOUNTER — Encounter (HOSPITAL_BASED_OUTPATIENT_CLINIC_OR_DEPARTMENT_OTHER): Payer: Medicare Other | Admitting: Oncology

## 2011-04-01 DIAGNOSIS — C50419 Malignant neoplasm of upper-outer quadrant of unspecified female breast: Secondary | ICD-10-CM

## 2011-04-01 DIAGNOSIS — Z170 Estrogen receptor positive status [ER+]: Secondary | ICD-10-CM

## 2011-04-01 DIAGNOSIS — Z853 Personal history of malignant neoplasm of breast: Secondary | ICD-10-CM

## 2011-04-01 LAB — CBC WITH DIFFERENTIAL/PLATELET
BASO%: 0.4 % (ref 0.0–2.0)
Basophils Absolute: 0 10*3/uL (ref 0.0–0.1)
EOS%: 4.9 % (ref 0.0–7.0)
Eosinophils Absolute: 0.2 10*3/uL (ref 0.0–0.5)
HCT: 40.9 % (ref 34.8–46.6)
HGB: 13.1 g/dL (ref 11.6–15.9)
LYMPH%: 43.9 % (ref 14.0–49.7)
MCH: 29.2 pg (ref 25.1–34.0)
MCHC: 32 g/dL (ref 31.5–36.0)
MCV: 91.3 fL (ref 79.5–101.0)
MONO#: 0.5 10*3/uL (ref 0.1–0.9)
MONO%: 10.2 % (ref 0.0–14.0)
NEUT#: 1.9 10*3/uL (ref 1.5–6.5)
NEUT%: 40.6 % (ref 38.4–76.8)
Platelets: 283 10*3/uL (ref 145–400)
RBC: 4.48 10*6/uL (ref 3.70–5.45)
RDW: 14 % (ref 11.2–14.5)
WBC: 4.7 10*3/uL (ref 3.9–10.3)
lymph#: 2.1 10*3/uL (ref 0.9–3.3)

## 2011-04-01 LAB — COMPREHENSIVE METABOLIC PANEL
ALT: 17 U/L (ref 0–35)
AST: 13 U/L (ref 0–37)
Albumin: 4.2 g/dL (ref 3.5–5.2)
Alkaline Phosphatase: 118 U/L — ABNORMAL HIGH (ref 39–117)
BUN: 26 mg/dL — ABNORMAL HIGH (ref 6–23)
CO2: 27 mEq/L (ref 19–32)
Calcium: 9.9 mg/dL (ref 8.4–10.5)
Chloride: 101 mEq/L (ref 96–112)
Creatinine, Ser: 1.1 mg/dL (ref 0.50–1.10)
Glucose, Bld: 121 mg/dL — ABNORMAL HIGH (ref 70–99)
Potassium: 3.9 mEq/L (ref 3.5–5.3)
Sodium: 139 mEq/L (ref 135–145)
Total Bilirubin: 0.4 mg/dL (ref 0.3–1.2)
Total Protein: 6.5 g/dL (ref 6.0–8.3)

## 2011-04-07 ENCOUNTER — Ambulatory Visit
Admission: RE | Admit: 2011-04-07 | Discharge: 2011-04-07 | Disposition: A | Discharge status: Home / Self Care | Payer: Medicare Other | Source: Ambulatory Visit | Attending: Oncology | Admitting: Oncology

## 2011-04-07 DIAGNOSIS — Z853 Personal history of malignant neoplasm of breast: Secondary | ICD-10-CM

## 2011-04-13 ENCOUNTER — Ambulatory Visit (HOSPITAL_BASED_OUTPATIENT_CLINIC_OR_DEPARTMENT_OTHER)
Admission: RE | Admit: 2011-04-13 | Discharge: 2011-04-13 | Disposition: A | Discharge status: Home / Self Care | Payer: Medicare Other | Source: Ambulatory Visit | Attending: General Surgery | Admitting: General Surgery

## 2011-04-13 DIAGNOSIS — Z452 Encounter for adjustment and management of vascular access device: Secondary | ICD-10-CM

## 2011-04-13 DIAGNOSIS — Z853 Personal history of malignant neoplasm of breast: Secondary | ICD-10-CM | POA: Insufficient documentation

## 2011-04-15 NOTE — Op Note (Signed)
  [name], [name]             ACCOUNT NO.:  [id number]  MEDICAL RECORD NO.:  [id number]  LOCATION:                                 FACILITY:  PHYSICIAN:  Ollen Gross. Vernell Morgans, M.D. DATE OF BIRTH:  11/25/1945  DATE OF PROCEDURE:  04/13/2011 DATE OF DISCHARGE:                              OPERATIVE REPORT   PREOPERATIVE DIAGNOSIS:  Right breast cancer.  POSTOPERATIVE DIAGNOSIS:  Right breast cancer.  PROCEDURE:  Removal of Port-A-Cath.  SURGEON:  Ollen Gross. Vernell Morgans, MD  ANESTHESIA:  Local.  PROCEDURE:  After informed consent was obtained, the patient was brought to the operating room and placed in the supine position on the operating table.  The patient's left chest area was prepped with ChloraPrep, allowed to dry and draped in usual sterile manner.  The area around the port and old incision was infiltrated with 1% lidocaine with epinephrine until the good field block was created.  A small incision was made with a 15-blade knife through her old incision.  This incision was carried down through the subcutaneous tissue until the reservoir of the port was identified.  The capsule around the port was opened sharply with a 15- blade knife.  The two anchoring stitches were identified.  There were grasped with a hemostat, divided with a 15-blade knife and removed.  The port was then able to be pushed out of its cavity and with gentle traction, the port was removed without difficulty.  Pressure was held for several minutes until the area was completely hemostatic.  The deep layer of the wound was then closed with interrupted 3-0 Vicryl stitches and the skin was closed with interrupted 4-0 Monocryl subcuticular stitches.  Dermabond dressing was applied.  The patient tolerated the procedure well.  At the end of the case, all needle, sponge, and instrument counts were correct.  The patient was then awaken and taken to the recovery room in stable condition.     Ollen Gross. Vernell Morgans,  M.D.     PST/MEDQ  D:  04/13/2011  T:  04/14/2011  Job:  [id number]  Electronically Signed by Chevis Pretty III M.D. on 04/15/2011 08:32:05 AM

## 2011-04-15 NOTE — Discharge Summary (Signed)
  NAMEMCKENZIE, BOVE NO.:  [id number]  MEDICAL RECORD NO.:  [id number]  LOCATION:  5127                         FACILITY:  [facility]  PHYSICIAN:  Ollen Gross. Vernell Morgans, M.D. DATE OF BIRTH:  1946-08-03  DATE OF ADMISSION:  03/03/2011 DATE OF DISCHARGE:  03/06/2011                              DISCHARGE SUMMARY   Ms. Chappelle is a 64 year old white female who was brought to the operating room on March 03, 2011, and underwent a right mastectomy with reconstruction by Dr. Odis Luster.  She tolerated the operation well.  On postop day #1, she had a fever and so she was continued on some antibiotics and continued to be observed.  She had a small area of duskiness on the superior skin flap that we monitored.  Her JPs were working well.  On postop day #2, she continued to have a fever.  It was thought she might have some evidence of community-acquired pneumonia, but she slowly improved with respiratory therapy and by March 06, 2011, her chest x-ray was improved.  Her fevers had resolved.  We were still monitoring her superior skin flap and her drains were in.  At this point, she was ready for discharge home.  DIAGNOSIS:  Right breast cancer.  DIET:  As tolerated.  ACTIVITIES:  No overhead activity.  FOLLOWUP:  Follow up with Dr. Odis Luster and myself in the next week and she is discharged home.     Ollen Gross. Vernell Morgans, M.D.     PST/MEDQ  D:  04/15/2011  T:  04/15/2011  Job:  [id number]  Electronically Signed by Chevis Pretty III M.D. on 04/15/2011 03:02:07 PM

## 2011-05-04 ENCOUNTER — Ambulatory Visit: Payer: Medicare Other | Attending: Plastic Surgery | Admitting: Physical Therapy

## 2011-05-04 DIAGNOSIS — M2550 Pain in unspecified joint: Secondary | ICD-10-CM | POA: Insufficient documentation

## 2011-05-04 DIAGNOSIS — IMO0001 Reserved for inherently not codable concepts without codable children: Secondary | ICD-10-CM | POA: Insufficient documentation

## 2011-05-04 DIAGNOSIS — M25619 Stiffness of unspecified shoulder, not elsewhere classified: Secondary | ICD-10-CM | POA: Insufficient documentation

## 2011-05-08 ENCOUNTER — Other Ambulatory Visit: Payer: Self-pay | Admitting: Oncology

## 2011-05-08 ENCOUNTER — Encounter (HOSPITAL_BASED_OUTPATIENT_CLINIC_OR_DEPARTMENT_OTHER): Payer: Medicare Other | Admitting: Oncology

## 2011-05-08 ENCOUNTER — Ambulatory Visit: Payer: Medicare Other | Admitting: Physical Therapy

## 2011-05-08 DIAGNOSIS — C50419 Malignant neoplasm of upper-outer quadrant of unspecified female breast: Secondary | ICD-10-CM

## 2011-05-08 DIAGNOSIS — Z170 Estrogen receptor positive status [ER+]: Secondary | ICD-10-CM

## 2011-05-08 LAB — COMPREHENSIVE METABOLIC PANEL
ALT: 15 U/L (ref 0–35)
AST: 14 U/L (ref 0–37)
Albumin: 5 g/dL (ref 3.5–5.2)
Alkaline Phosphatase: 121 U/L — ABNORMAL HIGH (ref 39–117)
BUN: 30 mg/dL — ABNORMAL HIGH (ref 6–23)
CO2: 30 mEq/L (ref 19–32)
Calcium: 10.1 mg/dL (ref 8.4–10.5)
Chloride: 99 mEq/L (ref 96–112)
Creatinine, Ser: 0.92 mg/dL (ref 0.50–1.10)
Glucose, Bld: 88 mg/dL (ref 70–99)
Potassium: 4.6 mEq/L (ref 3.5–5.3)
Sodium: 138 mEq/L (ref 135–145)
Total Bilirubin: 0.5 mg/dL (ref 0.3–1.2)
Total Protein: 6.8 g/dL (ref 6.0–8.3)

## 2011-05-08 LAB — CBC WITH DIFFERENTIAL/PLATELET
BASO%: 0.4 % (ref 0.0–2.0)
Basophils Absolute: 0 10*3/uL (ref 0.0–0.1)
EOS%: 0.8 % (ref 0.0–7.0)
Eosinophils Absolute: 0.1 10*3/uL (ref 0.0–0.5)
HCT: 40.6 % (ref 34.8–46.6)
HGB: 14 g/dL (ref 11.6–15.9)
LYMPH%: 24.3 % (ref 14.0–49.7)
MCH: 31.7 pg (ref 25.1–34.0)
MCHC: 34.5 g/dL (ref 31.5–36.0)
MCV: 91.8 fL (ref 79.5–101.0)
MONO#: 0.5 10*3/uL (ref 0.1–0.9)
MONO%: 6.1 % (ref 0.0–14.0)
NEUT#: 5.6 10*3/uL (ref 1.5–6.5)
NEUT%: 68.4 % (ref 38.4–76.8)
Platelets: 364 10*3/uL (ref 145–400)
RBC: 4.43 10*6/uL (ref 3.70–5.45)
RDW: 15.8 % — ABNORMAL HIGH (ref 11.2–14.5)
WBC: 8.2 10*3/uL (ref 3.9–10.3)
lymph#: 2 10*3/uL (ref 0.9–3.3)

## 2011-05-13 ENCOUNTER — Ambulatory Visit: Payer: Medicare Other | Attending: Plastic Surgery | Admitting: Physical Therapy

## 2011-05-13 DIAGNOSIS — IMO0001 Reserved for inherently not codable concepts without codable children: Secondary | ICD-10-CM | POA: Insufficient documentation

## 2011-05-13 DIAGNOSIS — M2550 Pain in unspecified joint: Secondary | ICD-10-CM | POA: Insufficient documentation

## 2011-05-13 DIAGNOSIS — M25619 Stiffness of unspecified shoulder, not elsewhere classified: Secondary | ICD-10-CM | POA: Insufficient documentation

## 2011-05-18 ENCOUNTER — Ambulatory Visit: Payer: Medicare Other

## 2011-05-20 ENCOUNTER — Ambulatory Visit: Payer: Medicare Other

## 2011-05-25 ENCOUNTER — Ambulatory Visit: Payer: Medicare Other

## 2011-05-27 ENCOUNTER — Ambulatory Visit: Payer: Medicare Other

## 2011-06-01 ENCOUNTER — Other Ambulatory Visit: Payer: Self-pay | Admitting: Oncology

## 2011-06-01 ENCOUNTER — Encounter (HOSPITAL_BASED_OUTPATIENT_CLINIC_OR_DEPARTMENT_OTHER): Payer: Medicare Other | Admitting: Oncology

## 2011-06-01 DIAGNOSIS — Z170 Estrogen receptor positive status [ER+]: Secondary | ICD-10-CM

## 2011-06-01 DIAGNOSIS — C50419 Malignant neoplasm of upper-outer quadrant of unspecified female breast: Secondary | ICD-10-CM

## 2011-06-01 LAB — CBC WITH DIFFERENTIAL/PLATELET
BASO%: 0.4 % (ref 0.0–2.0)
Basophils Absolute: 0 10*3/uL (ref 0.0–0.1)
EOS%: 3.2 % (ref 0.0–7.0)
Eosinophils Absolute: 0.2 10*3/uL (ref 0.0–0.5)
HCT: 40 % (ref 34.8–46.6)
HGB: 13.8 g/dL (ref 11.6–15.9)
LYMPH%: 35 % (ref 14.0–49.7)
MCH: 31.9 pg (ref 25.1–34.0)
MCHC: 34.6 g/dL (ref 31.5–36.0)
MCV: 92.2 fL (ref 79.5–101.0)
MONO#: 0.5 10*3/uL (ref 0.1–0.9)
MONO%: 9.9 % (ref 0.0–14.0)
NEUT#: 2.4 10*3/uL (ref 1.5–6.5)
NEUT%: 51.5 % (ref 38.4–76.8)
Platelets: 384 10*3/uL (ref 145–400)
RBC: 4.33 10*6/uL (ref 3.70–5.45)
RDW: 15.8 % — ABNORMAL HIGH (ref 11.2–14.5)
WBC: 4.8 10*3/uL (ref 3.9–10.3)
lymph#: 1.7 10*3/uL (ref 0.9–3.3)

## 2011-06-01 LAB — COMPREHENSIVE METABOLIC PANEL
ALT: 12 U/L (ref 0–35)
AST: 16 U/L (ref 0–37)
Albumin: 4.5 g/dL (ref 3.5–5.2)
Alkaline Phosphatase: 112 U/L (ref 39–117)
BUN: 23 mg/dL (ref 6–23)
CO2: 24 mEq/L (ref 19–32)
Calcium: 10 mg/dL (ref 8.4–10.5)
Chloride: 104 mEq/L (ref 96–112)
Creatinine, Ser: 0.89 mg/dL (ref 0.50–1.10)
Glucose, Bld: 96 mg/dL (ref 70–99)
Potassium: 4.4 mEq/L (ref 3.5–5.3)
Sodium: 141 mEq/L (ref 135–145)
Total Bilirubin: 0.4 mg/dL (ref 0.3–1.2)
Total Protein: 6.8 g/dL (ref 6.0–8.3)

## 2011-06-02 ENCOUNTER — Encounter: Payer: Medicare Other | Admitting: Physical Therapy

## 2011-06-04 ENCOUNTER — Encounter: Payer: Medicare Other | Admitting: Physical Therapy

## 2011-06-29 ENCOUNTER — Encounter: Payer: Medicare Other | Admitting: Oncology

## 2011-06-29 ENCOUNTER — Ambulatory Visit (INDEPENDENT_AMBULATORY_CARE_PROVIDER_SITE_OTHER): Payer: Medicare Other | Admitting: General Surgery

## 2011-06-29 ENCOUNTER — Encounter (INDEPENDENT_AMBULATORY_CARE_PROVIDER_SITE_OTHER): Payer: Self-pay | Admitting: General Surgery

## 2011-06-29 VITALS — BP 118/80 | HR 72 | Temp 98.2°F | Resp 12 | Ht 63.0 in | Wt 132.0 lb

## 2011-06-29 DIAGNOSIS — C50919 Malignant neoplasm of unspecified site of unspecified female breast: Secondary | ICD-10-CM

## 2011-06-29 NOTE — Patient Instructions (Signed)
Continue regular self exams Continue tamoxifen 

## 2011-06-29 NOTE — Progress Notes (Signed)
Subjective:     Patient ID: [name], female   DOB: July 11, 1946, 65 y.o.   MRN: [id number]  HPI The patient is a 65 year old white female who is now about 4 months out from a right mastectomy and negative sentinel node biopsy for a T1 C. N0 right breast cancer. She was ER PR positive HER-2 negative. She has some soreness of the right chest wall. I believe she has one more filled to go on her tissue expander.  Review of Systems     Objective:   Physical Exam On exam Lungs: Clear bilaterally with no use of accessory respiratory muscles Heart: Regular rate and rhythm with an impulse in the left chest Abdomen: Soft and nontender with no palpable mass or hepatosplenomegaly Breasts: she has no palpable mass of the right chest wall. Her tissue expander is intact. No palpable mass of the left breast. No axillary supraclavicular or cervical lymphadenopathy    Assessment:     4 months out from a right mastectomy and negative sentinel node biopsy with placement of a tissue expander    Plan:     At this point she will continue her tamoxifen. She continue regular self exams. She will continue close follow Dr. Odis Luster for her tissue expander. We will plan to see her back in about 3 months.

## 2011-08-11 ENCOUNTER — Other Ambulatory Visit: Payer: Self-pay | Admitting: Neurosurgery

## 2011-08-11 ENCOUNTER — Telehealth: Payer: Self-pay | Admitting: *Deleted

## 2011-08-11 DIAGNOSIS — M47812 Spondylosis without myelopathy or radiculopathy, cervical region: Secondary | ICD-10-CM

## 2011-08-11 NOTE — Telephone Encounter (Signed)
Notified pt, per MD okay to d/c Tamoxifen until January and restart Arimidex at that time. Pt verbalized understanding

## 2011-08-11 NOTE — Telephone Encounter (Signed)
PT LMOVM complains of being miserable while on Tamoxifen. Pt would like to d/c Tamoxifen until Jan. Then restart Arimidex again.

## 2011-09-03 ENCOUNTER — Other Ambulatory Visit: Payer: Self-pay | Admitting: *Deleted

## 2011-09-03 DIAGNOSIS — Z170 Estrogen receptor positive status [ER+]: Secondary | ICD-10-CM

## 2011-09-03 DIAGNOSIS — C50419 Malignant neoplasm of upper-outer quadrant of unspecified female breast: Secondary | ICD-10-CM

## 2011-09-03 DIAGNOSIS — C50919 Malignant neoplasm of unspecified site of unspecified female breast: Secondary | ICD-10-CM

## 2011-09-03 DIAGNOSIS — C44599 Other specified malignant neoplasm of skin of other part of trunk: Secondary | ICD-10-CM

## 2011-09-03 MED ORDER — ALPRAZOLAM 0.5 MG PO TABS: ORAL_TABLET | ORAL | Status: DC

## 2011-09-12 ENCOUNTER — Telehealth: Payer: Self-pay | Admitting: Oncology

## 2011-09-12 NOTE — Telephone Encounter (Signed)
gave appt info for 10/19/11 to spouse,per mosaiq    aom

## 2011-09-14 ENCOUNTER — Ambulatory Visit
Admission: RE | Admit: 2011-09-14 | Discharge: 2011-09-14 | Disposition: A | Discharge status: Home / Self Care | Payer: Medicare Other | Source: Ambulatory Visit | Attending: Neurosurgery | Admitting: Neurosurgery

## 2011-09-14 DIAGNOSIS — M47812 Spondylosis without myelopathy or radiculopathy, cervical region: Secondary | ICD-10-CM

## 2011-09-14 DIAGNOSIS — M5020 Other cervical disc displacement, unspecified cervical region: Secondary | ICD-10-CM | POA: Diagnosis not present

## 2011-09-14 DIAGNOSIS — M5030 Other cervical disc degeneration, unspecified cervical region: Secondary | ICD-10-CM | POA: Diagnosis not present

## 2011-09-21 DIAGNOSIS — M542 Cervicalgia: Secondary | ICD-10-CM | POA: Diagnosis not present

## 2011-09-21 DIAGNOSIS — M25549 Pain in joints of unspecified hand: Secondary | ICD-10-CM | POA: Diagnosis not present

## 2011-09-21 DIAGNOSIS — M25569 Pain in unspecified knee: Secondary | ICD-10-CM | POA: Diagnosis not present

## 2011-09-21 DIAGNOSIS — M19049 Primary osteoarthritis, unspecified hand: Secondary | ICD-10-CM | POA: Diagnosis not present

## 2011-09-28 ENCOUNTER — Encounter (INDEPENDENT_AMBULATORY_CARE_PROVIDER_SITE_OTHER): Payer: Medicare Other | Admitting: General Surgery

## 2011-09-28 DIAGNOSIS — M47812 Spondylosis without myelopathy or radiculopathy, cervical region: Secondary | ICD-10-CM | POA: Diagnosis not present

## 2011-10-08 ENCOUNTER — Encounter (INDEPENDENT_AMBULATORY_CARE_PROVIDER_SITE_OTHER): Payer: Medicare Other | Admitting: General Surgery

## 2011-10-19 ENCOUNTER — Ambulatory Visit (HOSPITAL_BASED_OUTPATIENT_CLINIC_OR_DEPARTMENT_OTHER): Payer: Medicare Other | Admitting: Oncology

## 2011-10-19 ENCOUNTER — Encounter: Payer: Self-pay | Admitting: Oncology

## 2011-10-19 ENCOUNTER — Other Ambulatory Visit: Payer: Medicare Other | Admitting: Lab

## 2011-10-19 ENCOUNTER — Telehealth: Payer: Self-pay | Admitting: *Deleted

## 2011-10-19 VITALS — BP 125/84 | HR 85 | Temp 98.5°F | Ht 63.0 in | Wt 136.4 lb

## 2011-10-19 DIAGNOSIS — C50919 Malignant neoplasm of unspecified site of unspecified female breast: Secondary | ICD-10-CM

## 2011-10-19 DIAGNOSIS — C50419 Malignant neoplasm of upper-outer quadrant of unspecified female breast: Secondary | ICD-10-CM | POA: Diagnosis not present

## 2011-10-19 DIAGNOSIS — Z170 Estrogen receptor positive status [ER+]: Secondary | ICD-10-CM | POA: Diagnosis not present

## 2011-10-19 LAB — COMPREHENSIVE METABOLIC PANEL
ALT: 14 U/L (ref 0–35)
AST: 15 U/L (ref 0–37)
Albumin: 4.3 g/dL (ref 3.5–5.2)
Alkaline Phosphatase: 95 U/L (ref 39–117)
BUN: 23 mg/dL (ref 6–23)
CO2: 29 mEq/L (ref 19–32)
Calcium: 9.7 mg/dL (ref 8.4–10.5)
Chloride: 106 mEq/L (ref 96–112)
Creatinine, Ser: 0.82 mg/dL (ref 0.50–1.10)
Glucose, Bld: 96 mg/dL (ref 70–99)
Potassium: 4.3 mEq/L (ref 3.5–5.3)
Sodium: 142 mEq/L (ref 135–145)
Total Bilirubin: 0.3 mg/dL (ref 0.3–1.2)
Total Protein: 6.5 g/dL (ref 6.0–8.3)

## 2011-10-19 LAB — CBC WITH DIFFERENTIAL/PLATELET
BASO%: 0.6 % (ref 0.0–2.0)
Basophils Absolute: 0 10*3/uL (ref 0.0–0.1)
EOS%: 1.9 % (ref 0.0–7.0)
Eosinophils Absolute: 0.1 10*3/uL (ref 0.0–0.5)
HCT: 41.3 % (ref 34.8–46.6)
HGB: 14.1 g/dL (ref 11.6–15.9)
LYMPH%: 44.3 % (ref 14.0–49.7)
MCH: 32.4 pg (ref 25.1–34.0)
MCHC: 34.2 g/dL (ref 31.5–36.0)
MCV: 94.8 fL (ref 79.5–101.0)
MONO#: 0.5 10*3/uL (ref 0.1–0.9)
MONO%: 9.5 % (ref 0.0–14.0)
NEUT#: 2.1 10*3/uL (ref 1.5–6.5)
NEUT%: 43.7 % (ref 38.4–76.8)
Platelets: 318 10*3/uL (ref 145–400)
RBC: 4.35 10*6/uL (ref 3.70–5.45)
RDW: 14.6 % — ABNORMAL HIGH (ref 11.2–14.5)
WBC: 4.8 10*3/uL (ref 3.9–10.3)
lymph#: 2.1 10*3/uL (ref 0.9–3.3)

## 2011-10-19 MED ORDER — ANASTROZOLE 1 MG PO TABS: 1.0000 mg | ORAL_TABLET | Freq: Every day | ORAL | Status: AC

## 2011-10-19 NOTE — Telephone Encounter (Signed)
gave patient appointment for 05-02-2012 starting at 2:00pm

## 2011-10-19 NOTE — Progress Notes (Signed)
OFFICE PROGRESS NOTE  CC  [name], MD, MD [address] [place], Suite [facility], [place]. [place] [address] Dr. Chevis Pretty Dr. Etter Sjogren Dr. Aram Beecham Romine Dr. Dorothy Puffer Dr. 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 This is in 2011.   #2 patient went on to receive neoadjuvant chemotherapy consisting of FEC 100 followed by 7 weeks of single agent Taxol.  #3 patient A mastectomy of the right breast with the final pathology revealing a T1 C. N0 disease.  #4 patient has had right breast reconstruction performed by Dr. Etter Sjogren.  #5 she is on letrozole 2.5 mg daily since August 2012. She subsequently discontinued this and we started her on tamoxifen. She thereafter discontinued that as well and was started on Arimidex. She stopped this for a few days and then went back on starting on January 2013.  CURRENT THERAPY:Arimidex 1 mg daily.  INTERVAL HISTORY: [name] 66 y.o. female returns for Visit today. She has had completion of her right mastectomy reconstruction. She overall feels well. Her main concern is hot flashes which are quite incapacitating. The frequency is not that much but the duration disc substantial with significant vasomotor symptoms including flushing sweating tingling when she begins to have a hot flash. She is otherwise denying any headaches double vision blurring of vision no fevers chills or night sweats. She does have her baseline myalgias and arthralgias due to her underlying collagen vascular disease. She has not had any bleeding problems no easy bruising. Remainder of the 10 point review of systems is negative.  MEDICAL HISTORY: Past Medical History    Diagnosis Date  . Arthritis   . Hearing loss   . Menopause   . Hormone replacement therapy (postmenopausal)     ALLERGIES:  is allergic to dilaudid and sulfa antibiotics.  MEDICATIONS:  Current Outpatient Prescriptions  Medication Sig Dispense Refill  . ALPRAZolam (XANAX) 0.5 MG tablet Take 1 po every 12 hours or at bedtime prn  40 tablet  1  . anastrozole (ARIMIDEX) 1 MG tablet Take 1 mg by mouth daily.      [name] B-Complex TABS Take by mouth 1 day or 1 dose.        . gabapentin (NEURONTIN) 100 MG capsule Take 100 mg by mouth 2 (two) times daily.        [name] glucosamine-chondroitin 500-400 MG tablet Take 1 tablet by mouth 2 (two) times daily.        . Multiple Vitamins-Minerals (CENTRUM SILVER ULTRA WOMENS PO) Take by mouth once a week.        . Nutritional Supplements (VITAMIN D MAINTENANCE PO) Take 1.25 mg by mouth every 30 (thirty) days.        [name] venlafaxine (EFFEXOR-XR) 75 MG 24 hr capsule Take 75 mg by mouth daily.        [name] anastrozole (ARIMIDEX) 1 MG tablet Take 1 tablet (1 mg total) by mouth daily.  90 tablet  12  . tamoxifen (NOLVADEX) 20 MG tablet Take 20 mg by mouth daily.          SURGICAL HISTORY:  Past Surgical History  Procedure Date  . Left index finger fusion   . Breast surgery     bilateral breast augmentation  .  Breast surgery 03/13/11    right mastectomy-sln,ERPR+,Her2-, Reconstruction    REVIEW OF SYSTEMS:  Pertinent items are noted in HPI.   PHYSICAL EXAMINATION: General appearance: alert, cooperative and appears stated age Lymph nodes: Cervical, supraclavicular, and axillary nodes normal. Resp: clear to auscultation bilaterally and normal percussion bilaterally Cardio: regular rate and rhythm, S1, S2 normal, no murmur, click, rub or gallop GI: soft, non-tender; bowel sounds normal; no masses,  no organomegaly Extremities: extremities normal, atraumatic, no cyanosis or edema Neurologic: Alert and oriented X 3, normal strength and tone. Normal symmetric  reflexes. Normal coordination and gait Breast examination left breast no masses nipple discharge or inversion she has had a lift performed. Right reconstructed breast skin is well healed so is the surgical scar ECOG PERFORMANCE STATUS: 1 - Symptomatic but completely ambulatory  Blood pressure 125/84, pulse 85, temperature 98.5 F (36.9 C), temperature source Oral, height 5\' 3"  (1.6 m), weight 136 lb 6.4 oz (61.871 kg).  LABORATORY DATA: Lab Results  Component Value Date   WBC 4.8 10/19/2011   HGB 14.1 10/19/2011   HCT 41.3 10/19/2011   MCV 94.8 10/19/2011   PLT 318 10/19/2011      Chemistry      Component Value Date/Time   NA 142 10/19/2011 1106   K 4.3 10/19/2011 1106   CL 106 10/19/2011 1106   CO2 29 10/19/2011 1106   BUN 23 10/19/2011 1106   CREATININE 0.82 10/19/2011 1106      Component Value Date/Time   CALCIUM 9.7 10/19/2011 1106   ALKPHOS 95 10/19/2011 1106   AST 15 10/19/2011 1106   ALT 14 10/19/2011 1106   BILITOT 0.3 10/19/2011 1106       RADIOGRAPHIC STUDIES:  No results found.  ASSESSMENT: 12-year-old female with  #1 originally stage II low-grade invasive ductal carcinoma ER positive PR positive HER-2/neu negative. Patient is status post neoadjuvant chemotherapy followed by right mastectomy. Her 2 patient was started on tamoxifen initially but she couldn't tolerate it so than she went on to be on letrozole which she could not tolerate either she is now on Arimidex 1 mg daily.  #2 patient has had right reconstructed breast and construction site looks terrific.   PLAN:  1. Continue 1 mg daily 2. See aptient back in 6 months time or sooner if need arises 3. She'll continue to have yearly mammograms on the left breast   All questions were answered. The patient knows to call the clinic with any problems, questions or concerns. We can certainly see the patient much sooner if necessary.  I spent 25 minutes counseling the patient face to face. The total time spent in the  appointment was 30 minutes.    Drue Second, MD Medical/Oncology [facility] [phone] (beeper) [phone] (Office)  10/19/2011, 5:37 PM

## 2011-11-23 ENCOUNTER — Other Ambulatory Visit: Payer: Self-pay | Admitting: *Deleted

## 2011-11-23 DIAGNOSIS — C50919 Malignant neoplasm of unspecified site of unspecified female breast: Secondary | ICD-10-CM

## 2011-11-23 DIAGNOSIS — C50419 Malignant neoplasm of upper-outer quadrant of unspecified female breast: Secondary | ICD-10-CM

## 2011-11-23 DIAGNOSIS — C44599 Other specified malignant neoplasm of skin of other part of trunk: Secondary | ICD-10-CM

## 2011-11-23 DIAGNOSIS — Z170 Estrogen receptor positive status [ER+]: Secondary | ICD-10-CM

## 2011-11-23 MED ORDER — ALPRAZOLAM 0.5 MG PO TABS: ORAL_TABLET | ORAL | Status: DC

## 2011-12-31 ENCOUNTER — Encounter (INDEPENDENT_AMBULATORY_CARE_PROVIDER_SITE_OTHER): Payer: Self-pay | Admitting: General Surgery

## 2012-01-07 ENCOUNTER — Ambulatory Visit (INDEPENDENT_AMBULATORY_CARE_PROVIDER_SITE_OTHER): Payer: Medicare Other | Admitting: General Surgery

## 2012-01-07 ENCOUNTER — Encounter (INDEPENDENT_AMBULATORY_CARE_PROVIDER_SITE_OTHER): Payer: Self-pay | Admitting: General Surgery

## 2012-01-07 VITALS — BP 116/88 | HR 95 | Temp 97.8°F | Ht 64.5 in | Wt 136.0 lb

## 2012-01-07 DIAGNOSIS — C50919 Malignant neoplasm of unspecified site of unspecified female breast: Secondary | ICD-10-CM

## 2012-01-07 NOTE — Patient Instructions (Signed)
Continue regular self exams Continue arimidex 

## 2012-01-11 ENCOUNTER — Other Ambulatory Visit: Payer: Self-pay | Admitting: Obstetrics and Gynecology

## 2012-01-11 DIAGNOSIS — Z853 Personal history of malignant neoplasm of breast: Secondary | ICD-10-CM

## 2012-01-14 ENCOUNTER — Ambulatory Visit
Admission: RE | Admit: 2012-01-14 | Discharge: 2012-01-14 | Disposition: A | Discharge status: Home / Self Care | Payer: Medicare Other | Source: Ambulatory Visit | Attending: Obstetrics and Gynecology | Admitting: Obstetrics and Gynecology

## 2012-01-14 ENCOUNTER — Other Ambulatory Visit: Payer: Self-pay | Admitting: Obstetrics and Gynecology

## 2012-01-14 ENCOUNTER — Encounter (INDEPENDENT_AMBULATORY_CARE_PROVIDER_SITE_OTHER): Payer: Self-pay | Admitting: General Surgery

## 2012-01-14 DIAGNOSIS — Z853 Personal history of malignant neoplasm of breast: Secondary | ICD-10-CM

## 2012-01-14 DIAGNOSIS — Z1231 Encounter for screening mammogram for malignant neoplasm of breast: Secondary | ICD-10-CM | POA: Diagnosis not present

## 2012-01-14 NOTE — Progress Notes (Signed)
Subjective:     Patient ID: [name], female   DOB: 08-Jul-1946, 66 y.o.   MRN: [id number]  HPI The patient is a 65 year old white female who is one year out from a right mastectomy and negative sentinel node biopsy for a T1 C. N0 right breast cancer. She was ER PR positive and HER-2/neu negative. Since her last visit she had a lift done on her left breast in December and an exchange of her tissue expander for an implant. She is currently taking Arimidex and seems to be tolerating that well. She has no complaints today.  Review of Systems  Constitutional: Negative.   HENT: Negative.   Eyes: Negative.   Respiratory: Negative.   Cardiovascular: Negative.   Gastrointestinal: Negative.   Genitourinary: Negative.   Musculoskeletal: Negative.   Skin: Negative.   Neurological: Negative.   Hematological: Negative.   Psychiatric/Behavioral: Negative.        Objective:   Physical Exam  Constitutional: She is oriented to person, place, and time. She appears well-developed and well-nourished.  HENT:  Head: Normocephalic and atraumatic.  Eyes: Conjunctivae and EOM are normal. Pupils are equal, round, and reactive to light.  Neck: Normal range of motion. Neck supple.  Cardiovascular: Normal rate, regular rhythm and normal heart sounds.   Pulmonary/Chest: Effort normal and breath sounds normal.       There is no palpable mass and the reconstructed right breast. No palpable mass in the left breast. No palpable axillary supraclavicular or cervical lymphadenopathy.  Abdominal: Soft. Bowel sounds are normal.  Musculoskeletal: Normal range of motion.  Neurological: She is alert and oriented to person, place, and time.  Skin: Skin is warm and dry.  Psychiatric: She has a normal mood and affect. Her behavior is normal.       Assessment:     One year status post right mastectomy and negative sentinel node biopsy with reconstruction    Plan:     At this point she seems to be doing well.  She will continue to do regular self exams. She will continue to take Arimidex. We will plan to see her back in about 6 months.

## 2012-01-18 ENCOUNTER — Telehealth (INDEPENDENT_AMBULATORY_CARE_PROVIDER_SITE_OTHER): Payer: Self-pay | Admitting: General Surgery

## 2012-01-18 ENCOUNTER — Other Ambulatory Visit (INDEPENDENT_AMBULATORY_CARE_PROVIDER_SITE_OTHER): Payer: Self-pay | Admitting: General Surgery

## 2012-01-18 DIAGNOSIS — N6320 Unspecified lump in the left breast, unspecified quadrant: Secondary | ICD-10-CM

## 2012-01-18 NOTE — Progress Notes (Signed)
Put in the order for the ultrasound and poss bx and informed pt that we wanted her to do the ultrasound

## 2012-01-18 NOTE — Telephone Encounter (Signed)
Called pt to let her know that we were going to send her for an ultrasound and possible breast bx on the left side.  Pt is aware.

## 2012-01-19 ENCOUNTER — Other Ambulatory Visit (INDEPENDENT_AMBULATORY_CARE_PROVIDER_SITE_OTHER): Payer: Self-pay | Admitting: General Surgery

## 2012-01-19 ENCOUNTER — Other Ambulatory Visit: Payer: Self-pay | Admitting: Obstetrics and Gynecology

## 2012-01-19 DIAGNOSIS — C50919 Malignant neoplasm of unspecified site of unspecified female breast: Secondary | ICD-10-CM

## 2012-01-19 DIAGNOSIS — R928 Other abnormal and inconclusive findings on diagnostic imaging of breast: Secondary | ICD-10-CM

## 2012-01-22 ENCOUNTER — Ambulatory Visit
Admission: RE | Admit: 2012-01-22 | Discharge: 2012-01-22 | Disposition: A | Discharge status: Home / Self Care | Payer: Medicare Other | Source: Ambulatory Visit | Attending: General Surgery | Admitting: General Surgery

## 2012-01-22 DIAGNOSIS — C50919 Malignant neoplasm of unspecified site of unspecified female breast: Secondary | ICD-10-CM

## 2012-01-22 DIAGNOSIS — N6489 Other specified disorders of breast: Secondary | ICD-10-CM | POA: Diagnosis not present

## 2012-01-22 DIAGNOSIS — R928 Other abnormal and inconclusive findings on diagnostic imaging of breast: Secondary | ICD-10-CM

## 2012-02-09 ENCOUNTER — Other Ambulatory Visit: Payer: Self-pay | Admitting: *Deleted

## 2012-02-09 DIAGNOSIS — C44599 Other specified malignant neoplasm of skin of other part of trunk: Secondary | ICD-10-CM

## 2012-02-09 DIAGNOSIS — C50919 Malignant neoplasm of unspecified site of unspecified female breast: Secondary | ICD-10-CM

## 2012-02-09 DIAGNOSIS — Z170 Estrogen receptor positive status [ER+]: Secondary | ICD-10-CM

## 2012-02-09 DIAGNOSIS — C50419 Malignant neoplasm of upper-outer quadrant of unspecified female breast: Secondary | ICD-10-CM

## 2012-02-09 MED ORDER — ALPRAZOLAM 0.5 MG PO TABS: ORAL_TABLET | ORAL | Status: DC

## 2012-02-17 DIAGNOSIS — Z124 Encounter for screening for malignant neoplasm of cervix: Secondary | ICD-10-CM | POA: Diagnosis not present

## 2012-02-17 DIAGNOSIS — Z01419 Encounter for gynecological examination (general) (routine) without abnormal findings: Secondary | ICD-10-CM | POA: Diagnosis not present

## 2012-02-19 DIAGNOSIS — M25549 Pain in joints of unspecified hand: Secondary | ICD-10-CM | POA: Diagnosis not present

## 2012-02-19 DIAGNOSIS — M25559 Pain in unspecified hip: Secondary | ICD-10-CM | POA: Diagnosis not present

## 2012-02-19 DIAGNOSIS — M76899 Other specified enthesopathies of unspecified lower limb, excluding foot: Secondary | ICD-10-CM | POA: Diagnosis not present

## 2012-02-19 DIAGNOSIS — M25569 Pain in unspecified knee: Secondary | ICD-10-CM | POA: Diagnosis not present

## 2012-03-24 DIAGNOSIS — M76899 Other specified enthesopathies of unspecified lower limb, excluding foot: Secondary | ICD-10-CM | POA: Diagnosis not present

## 2012-03-24 DIAGNOSIS — M25569 Pain in unspecified knee: Secondary | ICD-10-CM | POA: Diagnosis not present

## 2012-03-24 DIAGNOSIS — M25549 Pain in joints of unspecified hand: Secondary | ICD-10-CM | POA: Diagnosis not present

## 2012-04-11 ENCOUNTER — Other Ambulatory Visit: Payer: Self-pay | Admitting: *Deleted

## 2012-04-11 DIAGNOSIS — C50919 Malignant neoplasm of unspecified site of unspecified female breast: Secondary | ICD-10-CM

## 2012-04-11 DIAGNOSIS — C50419 Malignant neoplasm of upper-outer quadrant of unspecified female breast: Secondary | ICD-10-CM

## 2012-04-11 MED ORDER — IBANDRONATE SODIUM 150 MG PO TABS: 150.0000 mg | ORAL_TABLET | ORAL | Status: DC

## 2012-04-12 DIAGNOSIS — H60399 Other infective otitis externa, unspecified ear: Secondary | ICD-10-CM | POA: Diagnosis not present

## 2012-04-15 DIAGNOSIS — B351 Tinea unguium: Secondary | ICD-10-CM | POA: Diagnosis not present

## 2012-05-02 ENCOUNTER — Ambulatory Visit (HOSPITAL_BASED_OUTPATIENT_CLINIC_OR_DEPARTMENT_OTHER): Payer: Medicare Other | Admitting: Oncology

## 2012-05-02 ENCOUNTER — Telehealth: Payer: Self-pay | Admitting: Oncology

## 2012-05-02 ENCOUNTER — Other Ambulatory Visit (HOSPITAL_BASED_OUTPATIENT_CLINIC_OR_DEPARTMENT_OTHER): Payer: Medicare Other | Admitting: Lab

## 2012-05-02 ENCOUNTER — Other Ambulatory Visit: Payer: Self-pay | Admitting: Medical Oncology

## 2012-05-02 ENCOUNTER — Encounter: Payer: Self-pay | Admitting: Oncology

## 2012-05-02 VITALS — BP 129/84 | HR 82 | Temp 97.4°F | Resp 20 | Ht 64.5 in | Wt 136.3 lb

## 2012-05-02 DIAGNOSIS — E559 Vitamin D deficiency, unspecified: Secondary | ICD-10-CM

## 2012-05-02 DIAGNOSIS — C50919 Malignant neoplasm of unspecified site of unspecified female breast: Secondary | ICD-10-CM

## 2012-05-02 DIAGNOSIS — C50419 Malignant neoplasm of upper-outer quadrant of unspecified female breast: Secondary | ICD-10-CM | POA: Diagnosis not present

## 2012-05-02 DIAGNOSIS — C44599 Other specified malignant neoplasm of skin of other part of trunk: Secondary | ICD-10-CM

## 2012-05-02 DIAGNOSIS — Z170 Estrogen receptor positive status [ER+]: Secondary | ICD-10-CM

## 2012-05-02 DIAGNOSIS — G4700 Insomnia, unspecified: Secondary | ICD-10-CM | POA: Diagnosis not present

## 2012-05-02 LAB — COMPREHENSIVE METABOLIC PANEL (CC13)
ALT: 14 U/L (ref 0–55)
AST: 16 U/L (ref 5–34)
Albumin: 3.9 g/dL (ref 3.5–5.0)
Alkaline Phosphatase: 93 U/L (ref 40–150)
BUN: 21 mg/dL (ref 7.0–26.0)
CO2: 28 mEq/L (ref 22–29)
Calcium: 10 mg/dL (ref 8.4–10.4)
Chloride: 104 mEq/L (ref 98–107)
Creatinine: 0.9 mg/dL (ref 0.6–1.1)
Glucose: 95 mg/dl (ref 70–99)
Potassium: 5 mEq/L (ref 3.5–5.1)
Sodium: 140 mEq/L (ref 136–145)
Total Bilirubin: 0.3 mg/dL (ref 0.20–1.20)
Total Protein: 6.4 g/dL (ref 6.4–8.3)

## 2012-05-02 LAB — CBC WITH DIFFERENTIAL/PLATELET
BASO%: 0.3 % (ref 0.0–2.0)
Basophils Absolute: 0 10*3/uL (ref 0.0–0.1)
EOS%: 0.8 % (ref 0.0–7.0)
Eosinophils Absolute: 0.1 10*3/uL (ref 0.0–0.5)
HCT: 40.9 % (ref 34.8–46.6)
HGB: 13.9 g/dL (ref 11.6–15.9)
LYMPH%: 34.2 % (ref 14.0–49.7)
MCH: 32.8 pg (ref 25.1–34.0)
MCHC: 33.8 g/dL (ref 31.5–36.0)
MCV: 96.8 fL (ref 79.5–101.0)
MONO#: 0.6 10*3/uL (ref 0.1–0.9)
MONO%: 8.7 % (ref 0.0–14.0)
NEUT#: 3.6 10*3/uL (ref 1.5–6.5)
NEUT%: 56 % (ref 38.4–76.8)
Platelets: 300 10*3/uL (ref 145–400)
RBC: 4.23 10*6/uL (ref 3.70–5.45)
RDW: 14 % (ref 11.2–14.5)
WBC: 6.5 10*3/uL (ref 3.9–10.3)
lymph#: 2.2 10*3/uL (ref 0.9–3.3)

## 2012-05-02 MED ORDER — ALPRAZOLAM 0.5 MG PO TABS: ORAL_TABLET | ORAL | Status: DC

## 2012-05-02 MED ORDER — ANASTROZOLE 1 MG PO TABS: 1.0000 mg | ORAL_TABLET | Freq: Every day | ORAL | Status: AC

## 2012-05-02 NOTE — Progress Notes (Signed)
OFFICE PROGRESS NOTE  CC  [name], MD [address] [place], Suite [facility], [place]. [place] [address] Dr. Chevis Pretty Dr. Etter Sjogren Dr. Aram Beecham Romine Dr. Dorothy Puffer Dr. 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 This is in 2011.   #2 patient went on to receive neoadjuvant chemotherapy consisting of FEC 100 followed by 7 weeks of single agent Taxol.  #3 patient A mastectomy of the right breast with the final pathology revealing a T1 C. N0 disease.  #4 patient has had right breast reconstruction performed by Dr. Etter Sjogren.  #5 she is on letrozole 2.5 mg daily since August 2012. She subsequently discontinued this and we started her on tamoxifen. She thereafter discontinued that as well and was started on Arimidex. She stopped this for a few days and then went back on starting on January 2013.  CURRENT THERAPY:Arimidex 1 mg daily.  INTERVAL HISTORY: [name] 66 y.o. female returns for Visit today.She is doing great. She does have hot flashes about 3 a day. No there fevers or chills, no aches or pains, no nausea or vomiting. She is having difficulty sleeping and uses xnax to help her sleep. She is asking for a prescription for this and it was given. Remainder of the 10 point review of systems is negative.  MEDICAL HISTORY: Past Medical History  Diagnosis Date  . Arthritis   . Hearing loss   . Menopause   . Hormone replacement therapy (postmenopausal)     ALLERGIES:  is allergic to dilaudid and sulfa antibiotics.  MEDICATIONS:  Current Outpatient Prescriptions  Medication Sig Dispense Refill  . ALPRAZolam (XANAX) 0.5 MG tablet Take 1 po every 12 hours or at bedtime prn   40 tablet  1  . anastrozole (ARIMIDEX) 1 MG tablet Take 1 mg by mouth daily.      [name] B-Complex TABS Take by mouth 1 day or 1 dose.        . Calcium Carbonate (CALTRATE 600 PO) Take by mouth.      . fish oil-omega-3 fatty acids 1000 MG capsule Take 2 g by mouth daily.      [name] gabapentin (NEURONTIN) 100 MG capsule Take 100 mg by mouth 2 (two) times daily.        [name] ibandronate (BONIVA) 150 MG tablet Take 1 tablet (150 mg total) by mouth every 30 (thirty) days.  3 tablet  4  . Nutritional Supplements (VITAMIN D MAINTENANCE PO) Take 1.25 mg by mouth once a week.       . venlafaxine (EFFEXOR-XR) 75 MG 24 hr capsule Take 75 mg by mouth daily.          SURGICAL HISTORY:  Past Surgical History  Procedure Date  . Left index finger fusion   . Breast surgery     bilateral breast augmentation  . Breast surgery 03/13/11    right mastectomy-sln,ERPR+,Her2-, Reconstruction    REVIEW OF SYSTEMS:  Pertinent items are noted in HPI.   PHYSICAL EXAMINATION: General appearance: alert, cooperative and appears stated age Lymph nodes: Cervical, supraclavicular, and axillary nodes normal. Resp: clear to auscultation bilaterally and normal percussion bilaterally Cardio: regular rate and rhythm, S1, S2 normal, no murmur, click, rub or gallop GI: soft, non-tender; bowel sounds normal; no  masses,  no organomegaly Extremities: extremities normal, atraumatic, no cyanosis or edema Neurologic: Alert and oriented X 3, normal strength and tone. Normal symmetric reflexes. Normal coordination and gait Breast examination left breast no masses nipple discharge or inversion she has had a lift performed. Right reconstructed breast skin is well healed so is the surgical scar ECOG PERFORMANCE STATUS: 1 - Symptomatic but completely ambulatory  Blood pressure 129/84, pulse 82, temperature 97.4 F (36.3 C), temperature source Oral, resp. rate 20, height 5' 4.5" (1.638 m), weight 136 lb 4.8 oz (61.825 kg).  LABORATORY DATA: Lab  Results  Component Value Date   WBC 6.5 05/02/2012   HGB 13.9 05/02/2012   HCT 40.9 05/02/2012   MCV 96.8 05/02/2012   PLT 300 05/02/2012      Chemistry      Component Value Date/Time   NA 142 10/19/2011 1106   K 4.3 10/19/2011 1106   CL 106 10/19/2011 1106   CO2 29 10/19/2011 1106   BUN 23 10/19/2011 1106   CREATININE 0.82 10/19/2011 1106      Component Value Date/Time   CALCIUM 9.7 10/19/2011 1106   ALKPHOS 95 10/19/2011 1106   AST 15 10/19/2011 1106   ALT 14 10/19/2011 1106   BILITOT 0.3 10/19/2011 1106       RADIOGRAPHIC STUDIES:  No results found.  ASSESSMENT: 84-year-old female with  #1 originally stage II low-grade invasive ductal carcinoma ER positive PR positive HER-2/neu negative. Patient is status post neoadjuvant chemotherapy followed by right mastectomy. Her 2 patient was started on tamoxifen initially but she couldn't tolerate it so than she went on to be on letrozole which she could not tolerate either she is now on Arimidex 1 mg daily.  #2 patient has had right reconstructed breast and construction site looks terrific.   PLAN:  1. Continue arimidex 1 mg daily 2. See patientt back in 6 months time or sooner if need arises 3. She'll continue to have yearly mammograms on the left breast   All questions were answered. The patient knows to call the clinic with any problems, questions or concerns. We can certainly see the patient much sooner if necessary.  I spent 25 minutes counseling the patient face to face. The total time spent in the appointment was 30 minutes.    Drue Second, MD Medical/Oncology [facility] [phone] (beeper) [phone] (Office)  05/02/2012, 2:57 PM

## 2012-05-02 NOTE — Patient Instructions (Addendum)
1. You are doing well Continue arimidex  2. Try Peridin C for hot flashes.  3. i will see you back in 6 motnhs  Lab Results  Component Value Date   WBC 6.5 05/02/2012   HGB 13.9 05/02/2012   HCT 40.9 05/02/2012   MCV 96.8 05/02/2012   PLT 300 05/02/2012

## 2012-05-02 NOTE — Telephone Encounter (Signed)
gve the pt her feb 2014 appt calendar °

## 2012-06-10 DIAGNOSIS — Z09 Encounter for follow-up examination after completed treatment for conditions other than malignant neoplasm: Secondary | ICD-10-CM | POA: Diagnosis not present

## 2012-06-10 DIAGNOSIS — Z538 Procedure and treatment not carried out for other reasons: Secondary | ICD-10-CM | POA: Diagnosis not present

## 2012-06-10 DIAGNOSIS — R198 Other specified symptoms and signs involving the digestive system and abdomen: Secondary | ICD-10-CM | POA: Diagnosis not present

## 2012-06-10 DIAGNOSIS — Z86010 Personal history of colonic polyps: Secondary | ICD-10-CM | POA: Diagnosis not present

## 2012-06-10 DIAGNOSIS — K5669 Other intestinal obstruction: Secondary | ICD-10-CM | POA: Diagnosis not present

## 2012-06-14 ENCOUNTER — Other Ambulatory Visit: Payer: Self-pay | Admitting: Gastroenterology

## 2012-06-14 DIAGNOSIS — D369 Benign neoplasm, unspecified site: Secondary | ICD-10-CM

## 2012-06-21 ENCOUNTER — Other Ambulatory Visit: Payer: Self-pay | Admitting: Obstetrics and Gynecology

## 2012-06-21 DIAGNOSIS — R922 Inconclusive mammogram: Secondary | ICD-10-CM

## 2012-07-05 ENCOUNTER — Ambulatory Visit (INDEPENDENT_AMBULATORY_CARE_PROVIDER_SITE_OTHER): Payer: Medicare Other | Admitting: General Surgery

## 2012-07-18 ENCOUNTER — Encounter (INDEPENDENT_AMBULATORY_CARE_PROVIDER_SITE_OTHER): Payer: Self-pay | Admitting: General Surgery

## 2012-07-18 ENCOUNTER — Ambulatory Visit (INDEPENDENT_AMBULATORY_CARE_PROVIDER_SITE_OTHER): Payer: Medicare Other | Admitting: General Surgery

## 2012-07-18 VITALS — BP 122/78 | HR 90 | Temp 98.4°F | Resp 18 | Ht 63.5 in | Wt 135.2 lb

## 2012-07-18 DIAGNOSIS — C50919 Malignant neoplasm of unspecified site of unspecified female breast: Secondary | ICD-10-CM | POA: Diagnosis not present

## 2012-07-18 DIAGNOSIS — Z23 Encounter for immunization: Secondary | ICD-10-CM | POA: Diagnosis not present

## 2012-07-18 NOTE — Patient Instructions (Addendum)
Continue regular self exams Continue arimidex 

## 2012-07-22 ENCOUNTER — Ambulatory Visit
Admission: RE | Admit: 2012-07-22 | Discharge: 2012-07-22 | Disposition: A | Discharge status: Home / Self Care | Payer: Medicare Other | Source: Ambulatory Visit | Attending: Gastroenterology | Admitting: Gastroenterology

## 2012-07-22 DIAGNOSIS — D369 Benign neoplasm, unspecified site: Secondary | ICD-10-CM

## 2012-07-22 DIAGNOSIS — K5900 Constipation, unspecified: Secondary | ICD-10-CM | POA: Diagnosis not present

## 2012-07-26 ENCOUNTER — Ambulatory Visit
Admission: RE | Admit: 2012-07-26 | Discharge: 2012-07-26 | Disposition: A | Discharge status: Home / Self Care | Payer: Medicare Other | Source: Ambulatory Visit | Attending: Obstetrics and Gynecology | Admitting: Obstetrics and Gynecology

## 2012-07-26 DIAGNOSIS — Z853 Personal history of malignant neoplasm of breast: Secondary | ICD-10-CM | POA: Diagnosis not present

## 2012-07-26 DIAGNOSIS — R922 Inconclusive mammogram: Secondary | ICD-10-CM

## 2012-08-05 DIAGNOSIS — C50919 Malignant neoplasm of unspecified site of unspecified female breast: Secondary | ICD-10-CM | POA: Diagnosis not present

## 2012-08-05 DIAGNOSIS — H3530 Unspecified macular degeneration: Secondary | ICD-10-CM | POA: Diagnosis not present

## 2012-08-05 DIAGNOSIS — H35369 Drusen (degenerative) of macula, unspecified eye: Secondary | ICD-10-CM | POA: Diagnosis not present

## 2012-08-07 DIAGNOSIS — C50919 Malignant neoplasm of unspecified site of unspecified female breast: Secondary | ICD-10-CM | POA: Insufficient documentation

## 2012-08-07 DIAGNOSIS — H3530 Unspecified macular degeneration: Secondary | ICD-10-CM | POA: Insufficient documentation

## 2012-09-15 NOTE — Progress Notes (Signed)
Subjective:     Patient ID: [name], female   DOB: [birth year], 67 y.o.   MRN: [id number]  HPI The patient is a 67 year old female who is 1-1/2 years status post right mastectomy for a T1 C. N0 right breast cancer with reconstruction. She also had a left thumb to her left breast. There has been some question of an abnormality on her last mammogram in the left breast. She already has a 6 month followup for repeat imaging. She continues to take Arimidex and is tolerating that well. She denies any breast pain or discharge from her nipple. She does occasionally notice some joint aches.  Review of Systems  Constitutional: Negative.   HENT: Negative.   Eyes: Negative.   Respiratory: Negative.   Cardiovascular: Negative.   Gastrointestinal: Negative.   Genitourinary: Negative.   Musculoskeletal: Negative.   Skin: Negative.   Neurological: Negative.   Hematological: Negative.   Psychiatric/Behavioral: Negative.        Objective:   Physical Exam  Constitutional: She is oriented to person, place, and time. She appears well-developed and well-nourished.  HENT:  Head: Normocephalic and atraumatic.  Eyes: Conjunctivae normal and EOM are normal. Pupils are equal, round, and reactive to light.  Neck: Normal range of motion. Neck supple.  Pulmonary/Chest: Effort normal and breath sounds normal.       There is no palpable mass of the right reconstructed breast. There is no palpable mass of the left breast. There is no palpable axillary supraclavicular or cervical lymphadenopathy  Abdominal: Soft. Bowel sounds are normal. She exhibits no mass. There is no tenderness.  Musculoskeletal: Normal range of motion.  Lymphadenopathy:    She has no cervical adenopathy.  Neurological: She is alert and oriented to person, place, and time.  Skin: Skin is warm and dry.  Psychiatric: She has a normal mood and affect. Her behavior is normal.       Assessment:     One half years status post right  mastectomy with reconstruction for breast cancer    Plan:     At this point she will continue to take Arimidex. She will continue to do regular self exams. She has her followup mammogram scheduled already. We will plan to see her back in about 6 months.

## 2012-10-21 ENCOUNTER — Telehealth: Payer: Self-pay | Admitting: Oncology

## 2012-10-21 NOTE — Telephone Encounter (Signed)
Moved 2/21 appt to 2/25 due to [facility] CME. lmonvm for pt re change and new d/t. Mailed schedule.

## 2012-10-28 ENCOUNTER — Other Ambulatory Visit: Payer: Medicare Other | Admitting: Lab

## 2012-10-28 ENCOUNTER — Ambulatory Visit: Payer: Medicare Other | Admitting: Oncology

## 2012-11-01 ENCOUNTER — Other Ambulatory Visit (HOSPITAL_BASED_OUTPATIENT_CLINIC_OR_DEPARTMENT_OTHER): Payer: Medicare Other | Admitting: Lab

## 2012-11-01 ENCOUNTER — Ambulatory Visit (HOSPITAL_BASED_OUTPATIENT_CLINIC_OR_DEPARTMENT_OTHER): Payer: Medicare Other | Admitting: Oncology

## 2012-11-01 ENCOUNTER — Encounter: Payer: Self-pay | Admitting: Oncology

## 2012-11-01 ENCOUNTER — Telehealth: Payer: Self-pay | Admitting: Oncology

## 2012-11-01 VITALS — BP 119/75 | HR 82 | Temp 98.2°F | Resp 20 | Ht 63.5 in | Wt 132.3 lb

## 2012-11-01 DIAGNOSIS — G4700 Insomnia, unspecified: Secondary | ICD-10-CM | POA: Diagnosis not present

## 2012-11-01 DIAGNOSIS — C50419 Malignant neoplasm of upper-outer quadrant of unspecified female breast: Secondary | ICD-10-CM

## 2012-11-01 DIAGNOSIS — E559 Vitamin D deficiency, unspecified: Secondary | ICD-10-CM | POA: Diagnosis not present

## 2012-11-01 DIAGNOSIS — C50919 Malignant neoplasm of unspecified site of unspecified female breast: Secondary | ICD-10-CM

## 2012-11-01 DIAGNOSIS — C50911 Malignant neoplasm of unspecified site of right female breast: Secondary | ICD-10-CM

## 2012-11-01 DIAGNOSIS — Z170 Estrogen receptor positive status [ER+]: Secondary | ICD-10-CM

## 2012-11-01 LAB — CBC WITH DIFFERENTIAL/PLATELET
BASO%: 0.8 % (ref 0.0–2.0)
Basophils Absolute: 0 10*3/uL (ref 0.0–0.1)
EOS%: 2.4 % (ref 0.0–7.0)
Eosinophils Absolute: 0.1 10*3/uL (ref 0.0–0.5)
HCT: 42.7 % (ref 34.8–46.6)
HGB: 14.5 g/dL (ref 11.6–15.9)
LYMPH%: 34.8 % (ref 14.0–49.7)
MCH: 32.2 pg (ref 25.1–34.0)
MCHC: 34 g/dL (ref 31.5–36.0)
MCV: 94.7 fL (ref 79.5–101.0)
MONO#: 0.7 10*3/uL (ref 0.1–0.9)
MONO%: 12.4 % (ref 0.0–14.0)
NEUT#: 2.9 10*3/uL (ref 1.5–6.5)
NEUT%: 49.6 % (ref 38.4–76.8)
Platelets: 333 10*3/uL (ref 145–400)
RBC: 4.51 10*6/uL (ref 3.70–5.45)
RDW: 13.8 % (ref 11.2–14.5)
WBC: 5.9 10*3/uL (ref 3.9–10.3)
lymph#: 2 10*3/uL (ref 0.9–3.3)

## 2012-11-01 LAB — COMPREHENSIVE METABOLIC PANEL (CC13)
ALT: 12 U/L (ref 0–55)
AST: 14 U/L (ref 5–34)
Albumin: 3.9 g/dL (ref 3.5–5.0)
Alkaline Phosphatase: 100 U/L (ref 40–150)
BUN: 20.5 mg/dL (ref 7.0–26.0)
CO2: 30 mEq/L — ABNORMAL HIGH (ref 22–29)
Calcium: 10.1 mg/dL (ref 8.4–10.4)
Chloride: 104 mEq/L (ref 98–107)
Creatinine: 1.1 mg/dL (ref 0.6–1.1)
Glucose: 93 mg/dl (ref 70–99)
Potassium: 4.9 mEq/L (ref 3.5–5.1)
Sodium: 144 mEq/L (ref 136–145)
Total Bilirubin: 0.42 mg/dL (ref 0.20–1.20)
Total Protein: 7 g/dL (ref 6.4–8.3)

## 2012-11-01 MED ORDER — ANASTROZOLE 1 MG PO TABS: 1.0000 mg | ORAL_TABLET | Freq: Every day | ORAL | Status: DC

## 2012-11-01 NOTE — Telephone Encounter (Signed)
, °

## 2012-11-01 NOTE — Patient Instructions (Addendum)
Continue arimidex daily  Exercise and eat healthy because you are a survivor!!!  I will see you see you back in 6 months

## 2012-11-02 LAB — VITAMIN D 25 HYDROXY (VIT D DEFICIENCY, FRACTURES): Vit D, 25-Hydroxy: 56 ng/mL (ref 30–89)

## 2012-11-13 NOTE — Progress Notes (Signed)
OFFICE PROGRESS NOTE  CC  [name], MD [address] [place], Suite [facility], [place]. [name] [address] Dr. Chevis Pretty Dr. Etter Sjogren Dr. Aram Beecham Romine Dr. Dorothy Puffer Dr. 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 This is in 2011.   #2 patient went on to receive neoadjuvant chemotherapy consisting of FEC 100 followed by 7 weeks of single agent Taxol.  #3 patient A mastectomy of the right breast with the final pathology revealing a T1 C. N0 disease.  #4 patient has had right breast reconstruction performed by Dr. Etter Sjogren.  #5 she was initially on letrozole 2.5 mg daily since August 2012. She subsequently discontinued this and we started her on tamoxifen. She thereafter discontinued that as well and was started on Arimidex. She stopped this for a few days and then went back on starting on January 2013.  CURRENT THERAPY:Arimidex 1 mg daily.  INTERVAL HISTORY: [name] 67 y.o. female returns for Visit today.She is doing great. She does have hot flashes about 3 a day. No there fevers or chills, no aches or pains, no nausea or vomiting. She is having difficulty sleeping and uses xnax to help her sleep. She is asking for a prescription for this and it was given. Remainder of the 10 point review of systems is negative.  MEDICAL HISTORY: Past Medical History  Diagnosis Date  . Arthritis   . Hearing loss   . Menopause   . Hormone replacement therapy (postmenopausal)     ALLERGIES:  is allergic to dilaudid and sulfa antibiotics.  MEDICATIONS:  Current Outpatient Prescriptions  Medication Sig Dispense Refill  . Acetaminophen-Caffeine (EXCEDRIN TENSION HEADACHE PO) Take by mouth  as needed.      [name] anastrozole (ARIMIDEX) 1 MG tablet Take 1 tablet (1 mg total) by mouth daily.  90 tablet  12  . Bioflavonoid Products (PERIDIN-C PO) Take 2 tablets by mouth 2 (two) times daily.      . Calcium Carbonate (CALTRATE 600 PO) Take by mouth.      . doxylamine, Sleep, (UNISOM) 25 MG tablet Take 25 mg by mouth at bedtime as needed.      . fish oil-omega-3 fatty acids 1000 MG capsule Take 2 g by mouth daily.      . Flaxseed, Linseed, (FLAX SEEDS PO) Take 1,000 mg by mouth 2 (two) times daily.      [name] gabapentin (NEURONTIN) 100 MG capsule Take 100 mg by mouth 2 (two) times daily.        . Glucosamine-Chondroitin (OSTEO BI-FLEX REGULAR STRENGTH PO) Take by mouth.      . ibandronate (BONIVA) 150 MG tablet Take 1 tablet (150 mg total) by mouth every 30 (thirty) days.  3 tablet  4  . IBUPROFEN PO Take by mouth as needed.      . Multiple Vitamins-Minerals (ICAPS) CAPS Take 2 capsules by mouth daily.      . Nutritional Supplements (VITAMIN D MAINTENANCE PO) Take 1.25 mg by mouth once a week.       . Probiotic Product (PROBIOTIC DAILY PO) Take by mouth.      . venlafaxine (EFFEXOR-XR) 75 MG 24 hr capsule Take 75 mg by mouth daily.        [name] B-Complex TABS Take  by mouth 1 day or 1 dose.         No current facility-administered medications for this visit.    SURGICAL HISTORY:  Past Surgical History  Procedure Laterality Date  . Left index finger fusion    . Breast surgery      bilateral breast augmentation  . Breast surgery  03/13/11    right mastectomy-sln,ERPR+,Her2-, Reconstruction    REVIEW OF SYSTEMS:  Pertinent items are noted in HPI.   PHYSICAL EXAMINATION: General appearance: alert, cooperative and appears stated age Lymph nodes: Cervical, supraclavicular, and axillary nodes normal. Resp: clear to auscultation bilaterally and normal percussion bilaterally Cardio: regular rate and rhythm, S1, S2 normal, no murmur, click, rub or gallop GI: soft, non-tender; bowel sounds normal;  no masses,  no organomegaly Extremities: extremities normal, atraumatic, no cyanosis or edema Neurologic: Alert and oriented X 3, normal strength and tone. Normal symmetric reflexes. Normal coordination and gait Breast examination left breast no masses nipple discharge or inversion she has had a lift performed. Right reconstructed breast skin is well healed so is the surgical scar ECOG PERFORMANCE STATUS: 1 - Symptomatic but completely ambulatory  Blood pressure 119/75, pulse 82, temperature 98.2 F (36.8 C), temperature source Oral, resp. rate 20, height 5' 3.5" (1.613 m), weight 132 lb 4.8 oz (60.011 kg).  LABORATORY DATA: Lab Results  Component Value Date   WBC 5.9 11/01/2012   HGB 14.5 11/01/2012   HCT 42.7 11/01/2012   MCV 94.7 11/01/2012   PLT 333 11/01/2012      Chemistry      Component Value Date/Time   NA 144 11/01/2012 1055   NA 142 10/19/2011 1106   K 4.9 11/01/2012 1055   K 4.3 10/19/2011 1106   CL 104 11/01/2012 1055   CL 106 10/19/2011 1106   CO2 30* 11/01/2012 1055   CO2 29 10/19/2011 1106   BUN 20.5 11/01/2012 1055   BUN 23 10/19/2011 1106   CREATININE 1.1 11/01/2012 1055   CREATININE 0.82 10/19/2011 1106      Component Value Date/Time   CALCIUM 10.1 11/01/2012 1055   CALCIUM 9.7 10/19/2011 1106   ALKPHOS 100 11/01/2012 1055   ALKPHOS 95 10/19/2011 1106   AST 14 11/01/2012 1055   AST 15 10/19/2011 1106   ALT 12 11/01/2012 1055   ALT 14 10/19/2011 1106   BILITOT 0.42 11/01/2012 1055   BILITOT 0.3 10/19/2011 1106       RADIOGRAPHIC STUDIES:  No results found.  ASSESSMENT: 59-year-old female with  #1 originally stage II low-grade invasive ductal carcinoma ER positive PR positive HER-2/neu negative. Patient is status post neoadjuvant chemotherapy followed by right mastectomy. Her 2 patient was started on tamoxifen initially but she couldn't tolerate it so than she went on to be on letrozole which she could not tolerate either she is now on Arimidex 1 mg daily.  #2 patient  has had right reconstructed breast and reconstruction site looks terrific.   PLAN:  1. Continue arimidex 1 mg daily  2. See patientt back in 6 months time or sooner if need arises  3. She'll continue to have yearly mammograms on the left breast   All questions were answered. The patient knows to call the clinic with any problems, questions or concerns. We can certainly see the patient much sooner if necessary.  I spent 25 minutes counseling the patient face to face. The total time spent in the appointment was 30 minutes.    Drue Second, MD Medical/Oncology [facility] [phone] (beeper) [phone] (Office)  11/13/2012, 7:34 PM

## 2013-01-17 ENCOUNTER — Encounter: Payer: Self-pay | Admitting: Obstetrics and Gynecology

## 2013-01-26 ENCOUNTER — Ambulatory Visit (INDEPENDENT_AMBULATORY_CARE_PROVIDER_SITE_OTHER): Payer: Medicare Other | Admitting: General Surgery

## 2013-01-26 ENCOUNTER — Encounter (INDEPENDENT_AMBULATORY_CARE_PROVIDER_SITE_OTHER): Payer: Self-pay | Admitting: General Surgery

## 2013-01-26 VITALS — BP 128/78 | HR 92 | Resp 14 | Ht 63.5 in | Wt 126.2 lb

## 2013-01-26 DIAGNOSIS — C50919 Malignant neoplasm of unspecified site of unspecified female breast: Secondary | ICD-10-CM

## 2013-01-26 DIAGNOSIS — C50911 Malignant neoplasm of unspecified site of right female breast: Secondary | ICD-10-CM

## 2013-01-26 NOTE — Progress Notes (Signed)
Subjective:     Patient ID: [name], female   DOB: [birth year], 67 y.o.   MRN: [id number]  HPI The patient is a 67 year old white female who is 2 years status post right mastectomy and sentinel node biopsy for a T1 C. N0 right breast cancer. She was reconstructed by Dr. Odis Luster. She still takes anastrozole. She is having some worsening of her arthritis in her hands. She also feels some tightness at the right-sided reconstruction  Review of Systems  Constitutional: Negative.   HENT: Negative.   Eyes: Negative.   Respiratory: Positive for chest tightness.   Cardiovascular: Negative.   Gastrointestinal: Negative.   Genitourinary: Negative.   Musculoskeletal: Negative.   Skin: Negative.   Allergic/Immunologic: Negative.   Neurological: Negative.   Hematological: Negative.   Psychiatric/Behavioral: Negative.        Objective:   Physical Exam  Constitutional: She is oriented to person, place, and time. She appears well-developed and well-nourished.  HENT:  Head: Normocephalic and atraumatic.  Eyes: Conjunctivae and EOM are normal. Pupils are equal, round, and reactive to light.  Neck: Normal range of motion. Neck supple.  Cardiovascular: Normal rate, regular rhythm and normal heart sounds.   Pulmonary/Chest: Effort normal and breath sounds normal.  There is no palpable mass in either reconstructed breast. There is no palpable axillary or supraclavicular cervical lymphadenopathy. The right-sided implant appears to have dropped some compared to the left side  Abdominal: Soft. Bowel sounds are normal. She exhibits no mass. There is no tenderness.  Musculoskeletal: Normal range of motion.  Lymphadenopathy:    She has no cervical adenopathy.  Neurological: She is alert and oriented to person, place, and time.  Skin: Skin is warm and dry.  Psychiatric: She has a normal mood and affect. Her behavior is normal.       Assessment:     The patient is 2 years status post right  mastectomy for breast cancer.     Plan:     At this point she will continue to take anastrozole. She will continue to do regular self exams. We will plan to see her back in about 6 months. We will refer her back to Dr. Odis Luster to reevaluate her implants

## 2013-01-26 NOTE — Patient Instructions (Signed)
Continue regular self exams cintunue arimidex

## 2013-01-31 ENCOUNTER — Telehealth (INDEPENDENT_AMBULATORY_CARE_PROVIDER_SITE_OTHER): Payer: Self-pay

## 2013-01-31 NOTE — Telephone Encounter (Signed)
I called patient and let her know I scheduled her with Dr Odis Luster on June 2nd at 2:45.

## 2013-02-06 DIAGNOSIS — Z853 Personal history of malignant neoplasm of breast: Secondary | ICD-10-CM | POA: Diagnosis not present

## 2013-02-08 ENCOUNTER — Telehealth: Payer: Self-pay | Admitting: Medical Oncology

## 2013-02-08 MED ORDER — TAMOXIFEN CITRATE 20 MG PO TABS: 20.0000 mg | ORAL_TABLET | Freq: Every day | ORAL | Status: DC

## 2013-02-08 NOTE — Telephone Encounter (Signed)
She can take tamoxifen 20 mg daily, send a prescription to pharmacy

## 2013-02-08 NOTE — Telephone Encounter (Signed)
Per MD, informed patient to start Tamoxifen 20 mg daily, by mouth, prescription escribed to patient's pharmacy. Patient verbalized understanding. Encouraged patient to call office should she have any questions or concerns.

## 2013-02-08 NOTE — Telephone Encounter (Signed)
Patient called office to inform that she stopped taking Arimdex this past Friday and wishes to know when she will be starting on Tomoxifen.  mssg sent to MD for review.  02/25 L.O.V. With MD  Next sched appt 08/28 labs/MD

## 2013-02-28 ENCOUNTER — Ambulatory Visit: Payer: Self-pay | Admitting: Obstetrics and Gynecology

## 2013-03-03 ENCOUNTER — Other Ambulatory Visit: Payer: Self-pay | Admitting: Obstetrics & Gynecology

## 2013-03-03 NOTE — Telephone Encounter (Signed)
eScribe request for refill on VENLAFAXINE Last filled - 02/17/12 X 1 YEAR Last AEX - 02/17/12 Next AEX - 03/21/13 Please advise if OK to fill until AEX.  Chart on your desk.

## 2013-03-05 ENCOUNTER — Other Ambulatory Visit: Payer: Self-pay | Admitting: Obstetrics and Gynecology

## 2013-03-06 NOTE — Telephone Encounter (Signed)
Aex scheduled for 03/21/13 #4/0 rfs sent through to last pt until AEX

## 2013-03-21 ENCOUNTER — Ambulatory Visit: Payer: Self-pay | Admitting: Obstetrics and Gynecology

## 2013-03-27 ENCOUNTER — Ambulatory Visit: Payer: Self-pay | Admitting: Obstetrics and Gynecology

## 2013-03-28 DIAGNOSIS — H3530 Unspecified macular degeneration: Secondary | ICD-10-CM | POA: Diagnosis not present

## 2013-03-28 DIAGNOSIS — H35369 Drusen (degenerative) of macula, unspecified eye: Secondary | ICD-10-CM | POA: Diagnosis not present

## 2013-03-28 DIAGNOSIS — C50919 Malignant neoplasm of unspecified site of unspecified female breast: Secondary | ICD-10-CM | POA: Diagnosis not present

## 2013-04-04 ENCOUNTER — Encounter: Payer: Self-pay | Admitting: Obstetrics and Gynecology

## 2013-04-04 ENCOUNTER — Telehealth: Payer: Self-pay | Admitting: Obstetrics and Gynecology

## 2013-04-04 ENCOUNTER — Ambulatory Visit (INDEPENDENT_AMBULATORY_CARE_PROVIDER_SITE_OTHER): Payer: Medicare Other | Admitting: Obstetrics and Gynecology

## 2013-04-04 VITALS — BP 100/64 | HR 76 | Resp 16 | Ht 63.75 in | Wt 126.0 lb

## 2013-04-04 DIAGNOSIS — Z124 Encounter for screening for malignant neoplasm of cervix: Secondary | ICD-10-CM | POA: Diagnosis not present

## 2013-04-04 DIAGNOSIS — Z23 Encounter for immunization: Secondary | ICD-10-CM | POA: Diagnosis not present

## 2013-04-04 DIAGNOSIS — Z0000 Encounter for general adult medical examination without abnormal findings: Secondary | ICD-10-CM

## 2013-04-04 DIAGNOSIS — E781 Pure hyperglyceridemia: Secondary | ICD-10-CM | POA: Diagnosis not present

## 2013-04-04 DIAGNOSIS — Z01419 Encounter for gynecological examination (general) (routine) without abnormal findings: Secondary | ICD-10-CM

## 2013-04-04 LAB — HEMOGLOBIN, FINGERSTICK: Hemoglobin, fingerstick: 14.1 g/dL (ref 12.0–16.0)

## 2013-04-04 MED ORDER — VENLAFAXINE HCL ER 75 MG PO CP24: ORAL_CAPSULE | ORAL | Status: DC

## 2013-04-04 MED ORDER — VITAMIN D (ERGOCALCIFEROL) 1.25 MG (50000 UNIT) PO CAPS: ORAL_CAPSULE | ORAL | Status: DC

## 2013-04-04 NOTE — Telephone Encounter (Signed)
Please put her paper chart on my desk 

## 2013-04-04 NOTE — Telephone Encounter (Signed)
Patient had her AEX with you today and forgot to let you know of the rash she has had from her rectum to the front of her right cheek area. She states she has used some cream OTC but has not helped. There is itching but no burning with urination. Patient thought you had called something one time before for her for this problem of Rx. Please advise.

## 2013-04-04 NOTE — Telephone Encounter (Signed)
Forgot to tell Dr. Tresa Res about the rash on near her rectum .

## 2013-04-04 NOTE — Progress Notes (Signed)
67 y.o.   Married    Caucasian   female   [id number]   here for annual exam.  Arimidex caused more joint pain, so she agreed to try tamoxifen but pt is concerned it can cause crystals in her retina and she already has macular degeneration.  She is having horrible hot flashes.    No LMP recorded. Patient is postmenopausal.          Sexually active: yes  The current method of family planning is none.    Exercising: "some" Last mammogram:  Nov 2013 Last pap smear:May 2011 History of abnormal pap: no Smoking:occasional Alcohol:social Last colonoscopy:2011 Last Bone Density:  Dec 2011 -1.9/-1.0 Rec: repeat and pt will schedule Last tetanus shot: 2003 Last cholesterol check:   Hgb:   14.1             Urine:tr leuk, tr prot, pH=5.0   Family History  Problem Relation Age of Onset  . Breast cancer Mother 33  . Lupus Sister     Patient Active Problem List   Diagnosis Date Noted  . Breast cancer 03/23/2011    Past Medical History  Diagnosis Date  . Arthritis   . Hearing loss 2005    Right ear  . Menopause   . Hormone replacement therapy (postmenopausal)   . Colon polyps 2001  . S/P breast augmentation 1987  . Herniated disc 2003    L5-SI  . Complex endometrial hyperplasia with atypia 2000    Past Surgical History  Procedure Laterality Date  . Left index finger fusion      10/07; 3/08  . Breast surgery      bilateral breast augmentation  . Breast surgery  03/13/11    right mastectomy-sln,ERPR+,Her2-, Reconstruction  . Salpingectomy Right     Ectopic  . Tubal ligation    . Appendectomy  1983    LSO- Serous cystoadenoma    Allergies: Dilaudid and Sulfa antibiotics  Current Outpatient Prescriptions  Medication Sig Dispense Refill  . Acetaminophen-Caffeine (EXCEDRIN TENSION HEADACHE PO) Take by mouth as needed.      [name] Bioflavonoid Products (PERIDIN-C PO) Take 2 tablets by mouth 2 (two) times daily.      . Calcium Carbonate (CALTRATE 600 PO) Take by mouth.      .  doxylamine, Sleep, (UNISOM) 25 MG tablet Take 25 mg by mouth at bedtime as needed.      . fish oil-omega-3 fatty acids 1000 MG capsule Take 2 g by mouth daily.      . Flaxseed, Linseed, (FLAX SEEDS PO) Take 1,000 mg by mouth 2 (two) times daily.      . fluticasone (FLONASE) 50 MCG/ACT nasal spray Place 2 sprays into the nose daily.      [name] gabapentin (NEURONTIN) 100 MG capsule Take 100 mg by mouth 2 (two) times daily.        . Glucosamine-Chondroitin (OSTEO BI-FLEX REGULAR STRENGTH PO) Take by mouth.      . ibandronate (BONIVA) 150 MG tablet Take 1 tablet (150 mg total) by mouth every 30 (thirty) days.  3 tablet  4  . IBUPROFEN PO Take by mouth as needed.      . Multiple Vitamins-Minerals (ICAPS) CAPS Take 2 capsules by mouth daily.      . Probiotic Product (PROBIOTIC DAILY PO) Take by mouth.      . tamoxifen (NOLVADEX) 20 MG tablet Take 1 tablet (20 mg total) by mouth daily.  90 tablet  12  . venlafaxine  XR (EFFEXOR-XR) 75 MG 24 hr capsule TAKE 1 CAPSULE BY MOUTH DAILY  30 capsule  12  . Vitamin D, Ergocalciferol, (DRISDOL) 50000 UNITS CAPS TAKE 1 CAPSULE BY MOUTH EVERY OTHER WEEK  4 capsule  0  . anastrozole (ARIMIDEX) 1 MG tablet Take 1 tablet (1 mg total) by mouth daily.  90 tablet  12   No current facility-administered medications for this visit.    ROS: Pertinent items are noted in HPI.  Social Hx: Married, husband Rosanne Ashing, pt is retired, no children   Exam:    BP 100/64  Pulse 76  Resp 16  Ht 5' 3.75" (1.619 m)  Wt 126 lb (57.153 kg)  BMI 21.8 kg/m2  Ht stable, wt down 9 pounds from last year Wt Readings from Last 3 Encounters:  04/04/13 126 lb (57.153 kg)  01/26/13 126 lb 3.2 oz (57.244 kg)  11/01/12 132 lb 4.8 oz (60.011 kg)     Ht Readings from Last 3 Encounters:  04/04/13 5' 3.75" (1.619 m)  01/26/13 5' 3.5" (1.613 m)  11/01/12 5' 3.5" (1.613 m)    General appearance: alert, cooperative and appears stated age Head: Normocephalic, without obvious abnormality,  atraumatic Neck: no adenopathy, supple, symmetrical, trachea midline and thyroid not enlarged, symmetric, no tenderness/mass/nodules Lungs: clear to auscultation bilaterally Breasts: Inspection negative, No nipple retraction or dimpling, No nipple discharge or bleeding, No axillary or supraclavicular adenopathy, Normal to palpation without dominant masses Heart: regular rate and rhythm Abdomen: soft, non-tender; bowel sounds normal; no masses,  no organomegaly Extremities: extremities normal, atraumatic, no cyanosis or edema Skin: Skin color, texture, turgor normal. No rashes or lesions Lymph nodes: Cervical, supraclavicular, and axillary nodes normal. No abnormal inguinal nodes palpated Neurologic: Grossly normal   Pelvic: External genitalia:  no lesions              Urethra:  normal appearing urethra with no masses, tenderness or lesions              Bartholins and Skenes: normal                 Vagina: normal appearing vagina with normal color and discharge, no lesions              Cervix: normal appearance              Pap taken: no        Bimanual Exam:  Uterus:  uterus is normal size, shape, consistency and nontender                                      Adnexa: normal adnexa in size, nontender and no masses                                      Rectovaginal: Confirms                                      Anus:  normal sphincter tone, no lesions  A: normal menopausal exam, no HRT     Right breast cancer 2011, mastectomy, neoadjuvant chemo, taxol, intol of letrozole, arimidex      On boniva q month by Dr. Welton Flakes in oncology     60% hearing loss right  ear     S/p LSO, appy for serous cystadenoma 1983     P:     mammogram counseled on breast self exam, mammography screening, osteoporosis, adequate intake of calcium and vitamin D, diet and exercise return annually or prn     An After Visit Summary was printed and given to the patient.

## 2013-04-04 NOTE — Patient Instructions (Signed)

## 2013-04-05 LAB — LIPID PANEL
Cholesterol: 174 mg/dL (ref 0–200)
HDL: 64 mg/dL (ref >39)
LDL Cholesterol: 94 mg/dL (ref 0–99)
Total CHOL/HDL Ratio: 2.7 Ratio
Triglycerides: 81 mg/dL (ref <150)
VLDL: 16 mg/dL (ref 0–40)

## 2013-04-05 NOTE — Telephone Encounter (Signed)
The only note I have in the chart about this is from 2010 when she said she was having some itching and I rec she use OTC hydrocortisone ointment.  I don't see that I ever rx'd anything else.  If that doesn't work, I'm afraid she'll need to come back in.   Sorry!

## 2013-04-05 NOTE — Telephone Encounter (Signed)
Patient notified of Dr. Tresa Res instructions of use of OTC Hydrocortizone ointment. Patient will try this and understands to call back if no better.

## 2013-04-05 NOTE — Telephone Encounter (Signed)
Left message on CB#VM of need to call office for information of RX form Dr. Tresa Res.

## 2013-04-12 ENCOUNTER — Other Ambulatory Visit: Payer: Self-pay

## 2013-04-18 ENCOUNTER — Other Ambulatory Visit: Payer: Self-pay | Admitting: *Deleted

## 2013-04-18 DIAGNOSIS — C50919 Malignant neoplasm of unspecified site of unspecified female breast: Secondary | ICD-10-CM

## 2013-04-18 DIAGNOSIS — C50411 Malignant neoplasm of upper-outer quadrant of right female breast: Secondary | ICD-10-CM

## 2013-04-18 MED ORDER — IBANDRONATE SODIUM 150 MG PO TABS: 150.0000 mg | ORAL_TABLET | ORAL | Status: DC

## 2013-05-02 ENCOUNTER — Encounter: Payer: Self-pay | Admitting: Gynecology

## 2013-05-02 ENCOUNTER — Ambulatory Visit (INDEPENDENT_AMBULATORY_CARE_PROVIDER_SITE_OTHER): Payer: Medicare Other | Admitting: Gynecology

## 2013-05-02 VITALS — BP 122/76 | HR 78 | Resp 16 | Ht 63.75 in | Wt 127.0 lb

## 2013-05-02 DIAGNOSIS — R21 Rash and other nonspecific skin eruption: Secondary | ICD-10-CM

## 2013-05-02 DIAGNOSIS — N898 Other specified noninflammatory disorders of vagina: Secondary | ICD-10-CM

## 2013-05-02 LAB — POCT WET PREP (WET MOUNT): Clue Cells Wet Prep Whiff POC: NEGATIVE

## 2013-05-02 MED ORDER — NYSTATIN-TRIAMCINOLONE 100000-0.1 UNIT/GM-% EX OINT: TOPICAL_OINTMENT | Freq: Two times a day (BID) | CUTANEOUS | Status: DC

## 2013-05-02 MED ORDER — FLUCONAZOLE 150 MG PO TABS: 150.0000 mg | ORAL_TABLET | Freq: Once | ORAL | Status: DC

## 2013-05-02 NOTE — Progress Notes (Signed)
Subjective:     Patient ID: [name], female   DOB: 1946/01/29, 67 y.o.   MRN: [id number]  Rash This is a new problem. The current episode started more than 1 month ago. The problem has been waxing and waning since onset. The affected locations include the right buttock. The rash is characterized by itchiness. Past treatments include topical steroids, anti-itch cream and antihistamine. The treatment provided mild relief. There is no history of allergies or eczema.     Review of Systems  Skin: Positive for rash.  All other systems reviewed and are negative.       Objective:   Physical Exam  Constitutional: She is oriented to person, place, and time. She appears well-developed and well-nourished.  Genitourinary:     Neurological: She is alert and oriented to person, place, and time.  Skin: Rash (see picture) noted.  Pelvic exam: VULVA: normal appearing vulva with no masses, tenderness or lesions, VAGINA: atrophic, vaginal discharge - white, frothy, odorless, thin and vulvar erythema noted, WET MOUNT done - results: KOH done, vaginal pH is 4.5, parabasal few yeast, CERVIX: atrophic changes      Assessment:     Yeast vaginitis Dermal yeast Skin rash     Plan:     Diflucan Topical mycolog ointment-copy of image given to pt regarding placement See instructions F/u 2w

## 2013-05-02 NOTE — Patient Instructions (Signed)
Apply steroid to areas 1-4 and a bit beyond the the rectum Sitz bath as needed Use cotton underwear Blow Dry skin on cool setting after shower or bath

## 2013-05-04 ENCOUNTER — Telehealth: Payer: Self-pay | Admitting: Oncology

## 2013-05-04 ENCOUNTER — Other Ambulatory Visit (HOSPITAL_BASED_OUTPATIENT_CLINIC_OR_DEPARTMENT_OTHER): Payer: Medicare Other | Admitting: Lab

## 2013-05-04 ENCOUNTER — Encounter: Payer: Self-pay | Admitting: Oncology

## 2013-05-04 ENCOUNTER — Ambulatory Visit (HOSPITAL_BASED_OUTPATIENT_CLINIC_OR_DEPARTMENT_OTHER): Payer: Medicare Other | Admitting: Oncology

## 2013-05-04 VITALS — BP 112/77 | HR 88 | Temp 98.6°F | Resp 20 | Ht 63.75 in | Wt 126.7 lb

## 2013-05-04 DIAGNOSIS — C50919 Malignant neoplasm of unspecified site of unspecified female breast: Secondary | ICD-10-CM

## 2013-05-04 DIAGNOSIS — C50911 Malignant neoplasm of unspecified site of right female breast: Secondary | ICD-10-CM

## 2013-05-04 LAB — CBC WITH DIFFERENTIAL/PLATELET
BASO%: 0.4 % (ref 0.0–2.0)
Basophils Absolute: 0 10*3/uL (ref 0.0–0.1)
EOS%: 1.5 % (ref 0.0–7.0)
Eosinophils Absolute: 0.1 10*3/uL (ref 0.0–0.5)
HCT: 41.7 % (ref 34.8–46.6)
HGB: 13.9 g/dL (ref 11.6–15.9)
LYMPH%: 35.5 % (ref 14.0–49.7)
MCH: 31.7 pg (ref 25.1–34.0)
MCHC: 33.3 g/dL (ref 31.5–36.0)
MCV: 95 fL (ref 79.5–101.0)
MONO#: 0.7 10*3/uL (ref 0.1–0.9)
MONO%: 10 % (ref 0.0–14.0)
NEUT#: 3.5 10*3/uL (ref 1.5–6.5)
NEUT%: 52.6 % (ref 38.4–76.8)
Platelets: 309 10*3/uL (ref 145–400)
RBC: 4.39 10*6/uL (ref 3.70–5.45)
RDW: 14.5 % (ref 11.2–14.5)
WBC: 6.7 10*3/uL (ref 3.9–10.3)
lymph#: 2.4 10*3/uL (ref 0.9–3.3)

## 2013-05-04 LAB — COMPREHENSIVE METABOLIC PANEL (CC13)
ALT: 9 U/L (ref 0–55)
AST: 14 U/L (ref 5–34)
Albumin: 3.7 g/dL (ref 3.5–5.0)
Alkaline Phosphatase: 78 U/L (ref 40–150)
BUN: 24.8 mg/dL (ref 7.0–26.0)
CO2: 25 mEq/L (ref 22–29)
Calcium: 9.7 mg/dL (ref 8.4–10.4)
Chloride: 103 mEq/L (ref 98–109)
Creatinine: 1 mg/dL (ref 0.6–1.1)
Glucose: 105 mg/dl (ref 70–140)
Potassium: 4.3 mEq/L (ref 3.5–5.1)
Sodium: 139 mEq/L (ref 136–145)
Total Bilirubin: 0.45 mg/dL (ref 0.20–1.20)
Total Protein: 6.7 g/dL (ref 6.4–8.3)

## 2013-05-04 MED ORDER — ANASTROZOLE 1 MG PO TABS: 1.0000 mg | ORAL_TABLET | Freq: Every day | ORAL | Status: DC

## 2013-05-04 NOTE — Progress Notes (Signed)
OFFICE PROGRESS NOTE  CC  [name], MD [address] [place], [address] [facility], [place]. [place] [address] Dr. Chevis Pretty Dr. Etter Sjogren Dr. Aram Beecham Romine Dr. Dorothy Puffer Dr. 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 This is in 2011.   #2 patient went on to receive neoadjuvant chemotherapy consisting of FEC 100 followed by 7 weeks of single agent Taxol.  #3 patient A mastectomy of the right breast with the final pathology revealing a T1 C. N0 disease.  #4 patient has had right breast reconstruction performed by Dr. Etter Sjogren.  #5 she was initially on letrozole 2.5 mg daily since August 2012. She subsequently discontinued this and we started her on tamoxifen. She thereafter discontinued that as well and was started on Arimidex. She stopped this for a few days and then went back on starting on January 2013.  CURRENT THERAPY: Arimidex 1 mg daily.  INTERVAL HISTORY: [name] 67 y.o. female returns for Visit today.She is doing great. She does have hot flashes about 3 a day. No there fevers or chills, no aches or pains, no nausea or vomiting. She is having difficulty sleeping and uses xnax to help her sleep. She is asking for a prescription for this and it was given. Remainder of the 10 point review of systems is negative.  MEDICAL HISTORY: Past Medical History  Diagnosis Date  . Arthritis   . Hearing loss 2005    Right ear  . Menopause   . Hormone replacement therapy (postmenopausal)   . Colon polyps 2001  . S/P breast augmentation 1987  . Herniated disc 2003    L5-SI  . Complex endometrial hyperplasia without atypia 2000    ALLERGIES:  is allergic to dilaudid and sulfa  antibiotics.  MEDICATIONS:  Current Outpatient Prescriptions  Medication Sig Dispense Refill  . Acetaminophen-Caffeine (EXCEDRIN TENSION HEADACHE PO) Take by mouth as needed.      [name] Bioflavonoid Products (PERIDIN-C PO) Take 2 tablets by mouth 2 (two) times daily.      . Calcium Carbonate (CALTRATE 600 PO) Take by mouth.      . doxylamine, Sleep, (UNISOM) 25 MG tablet Take 25 mg by mouth at bedtime as needed.      . fish oil-omega-3 fatty acids 1000 MG capsule Take 2 g by mouth daily.      . Flaxseed, Linseed, (FLAX SEEDS PO) Take 1,000 mg by mouth 2 (two) times daily.      . fluconazole (DIFLUCAN) 150 MG tablet Take 1 tablet (150 mg total) by mouth once.  1 tablet  0  . fluticasone (FLONASE) 50 MCG/ACT nasal spray Place 2 sprays into the nose daily.      [name] gabapentin (NEURONTIN) 100 MG capsule Take 100 mg by mouth 2 (two) times daily.        . Glucosamine-Chondroitin (OSTEO BI-FLEX REGULAR STRENGTH PO) Take by mouth.      . ibandronate (BONIVA) 150 MG tablet Take 1 tablet (150 mg total) by mouth every 30 (thirty) days.  3 tablet  0  . IBUPROFEN PO Take by mouth as needed.      . nystatin-triamcinolone ointment (MYCOLOG) Apply topically 2 (two) times daily.  30 g  0  . Probiotic Product (PROBIOTIC DAILY PO) Take  by mouth.      . venlafaxine XR (EFFEXOR-XR) 75 MG 24 hr capsule TAKE 1 CAPSULE BY MOUTH DAILY  90 capsule  3  . Vitamin D, Ergocalciferol, (DRISDOL) 50000 UNITS CAPS TAKE 1 CAPSULE BY MOUTH EVERY OTHER WEEK  26 capsule  0  . anastrozole (ARIMIDEX) 1 MG tablet Take 1 mg by mouth daily.      . tamoxifen (NOLVADEX) 20 MG tablet Take 1 tablet (20 mg total) by mouth daily.  90 tablet  12   No current facility-administered medications for this visit.    SURGICAL HISTORY:  Past Surgical History  Procedure Laterality Date  . Left index finger fusion      10/07; 3/08  . Breast surgery      bilateral breast augmentation  . Breast surgery  03/13/11    right mastectomy-sln,ERPR+,Her2-,  Reconstruction  . Salpingectomy Right     Ectopic  . Tubal ligation    . 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   PHYSICAL EXAMINATION: General appearance: alert, cooperative and appears stated age Lymph nodes: Cervical, supraclavicular, and axillary nodes normal. Resp: clear to auscultation bilaterally and normal percussion bilaterally Cardio: regular rate and rhythm, S1, S2 normal, no murmur, click, rub or gallop GI: soft, non-tender; bowel sounds normal; no masses,  no organomegaly Extremities: extremities normal, atraumatic, no cyanosis or edema Neurologic: Alert and oriented X 3, normal strength and tone. Normal symmetric reflexes. Normal coordination and gait Breast examination left breast no masses nipple discharge or inversion she has had a lift performed. Right reconstructed breast skin is well healed so is the surgical scar ECOG PERFORMANCE STATUS: 1 - Symptomatic but completely ambulatory  Blood pressure 112/77, pulse 88, temperature 98.6 F (37 C), temperature source Oral, resp. rate 20, height 5' 3.75" (1.619 m), weight 126 lb 11.2 oz (57.471 kg).  LABORATORY DATA: Lab Results  Component Value Date   WBC 6.7 05/04/2013   HGB 13.9 05/04/2013   HCT 41.7 05/04/2013   MCV 95.0 05/04/2013   PLT 309 05/04/2013      Chemistry      Component Value Date/Time   NA 139 05/04/2013 1057   NA 142 10/19/2011 1106   K 4.3 05/04/2013 1057   K 4.3 10/19/2011 1106   CL 104 11/01/2012 1055   CL 106 10/19/2011 1106   CO2 25 05/04/2013 1057   CO2 29 10/19/2011 1106   BUN 24.8 05/04/2013 1057   BUN 23 10/19/2011 1106   CREATININE 1.0 05/04/2013 1057   CREATININE 0.82 10/19/2011 1106      Component Value Date/Time   CALCIUM 9.7 05/04/2013 1057   CALCIUM 9.7 10/19/2011 1106   ALKPHOS 78 05/04/2013 1057   ALKPHOS 95 10/19/2011 1106   AST 14 05/04/2013 1057   AST 15 10/19/2011 1106   ALT 9 05/04/2013 1057   ALT 14 10/19/2011 1106    BILITOT 0.45 05/04/2013 1057   BILITOT 0.3 10/19/2011 1106       RADIOGRAPHIC STUDIES:  No results found.  ASSESSMENT: 66 year old female with  #1 originally stage II low-grade invasive ductal carcinoma ER positive PR positive HER-2/neu negative. Patient is status post neoadjuvant chemotherapy followed by right mastectomy. Her 2 patient was started on tamoxifen initially but she couldn't tolerate it so than she went on to be on letrozole which she could not tolerate either she is now on Arimidex 1 mg daily.  #2  patient has had right reconstructed breast and reconstruction site looks terrific.   PLAN:  1. Continue arimidex 1 mg daily  2. See patientt back in 6 months time or sooner if need arises  3. She'll continue to have yearly mammograms on the left breast   All questions were answered. The patient knows to call the clinic with any problems, questions or concerns. We can certainly see the patient much sooner if necessary.  I spent 25 minutes counseling the patient face to face. The total time spent in the appointment was 30 minutes.    Drue Second, MD Medical/Oncology [facility] [phone] (beeper) [phone] (Office)  05/04/2013, 12:57 PM

## 2013-05-17 ENCOUNTER — Ambulatory Visit (INDEPENDENT_AMBULATORY_CARE_PROVIDER_SITE_OTHER): Payer: Medicare Other | Admitting: Gynecology

## 2013-05-17 ENCOUNTER — Encounter: Payer: Self-pay | Admitting: Gynecology

## 2013-05-17 VITALS — BP 120/82 | HR 78 | Resp 14 | Ht 63.75 in | Wt 128.0 lb

## 2013-05-17 DIAGNOSIS — R21 Rash and other nonspecific skin eruption: Secondary | ICD-10-CM

## 2013-05-17 MED ORDER — HYDROCORTISONE ACETATE 25 MG RE SUPP: 25.0000 mg | Freq: Two times a day (BID) | RECTAL | Status: DC

## 2013-05-17 NOTE — Progress Notes (Signed)
Subjective:     Patient ID: [name], female   DOB: 1946/04/29, 67 y.o.   MRN: [id number]  HPI Comments: Pt reports feeling better, she no longer is irritated.  She does report some discomfort with bowel moevements    Review of Systems  All other systems reviewed and are negative.       Objective:   Physical Exam  Constitutional: She is oriented to person, place, and time. She appears well-developed and well-nourished.  Genitourinary:     Neurological: She is alert and oriented to person, place, and time.  Skin: No rash (see picture) noted.       Assessment:     Yeast dermatitis-resolved hemorrhoid     Plan:     anusol HC F/u prn

## 2013-05-26 ENCOUNTER — Other Ambulatory Visit: Payer: Self-pay | Admitting: Emergency Medicine

## 2013-05-26 DIAGNOSIS — C50919 Malignant neoplasm of unspecified site of unspecified female breast: Secondary | ICD-10-CM

## 2013-05-29 ENCOUNTER — Telehealth: Payer: Self-pay | Admitting: Medical Oncology

## 2013-05-29 NOTE — Telephone Encounter (Signed)
Patient's spouse called stating they have not heard anything about an appt with Dr Tessa Lerner @ [facility], asking about the status of that. Reviewed with MD, and informed spouse referral made last week and to expect a call from Dr [facility] office regarding appt but to give it a few days. Spouse expressed verbal understanding, no further questions at this time.

## 2013-05-31 ENCOUNTER — Other Ambulatory Visit: Payer: Self-pay | Admitting: Emergency Medicine

## 2013-06-15 DIAGNOSIS — H524 Presbyopia: Secondary | ICD-10-CM | POA: Diagnosis not present

## 2013-06-15 DIAGNOSIS — H5200 Hypermetropia, unspecified eye: Secondary | ICD-10-CM | POA: Diagnosis not present

## 2013-06-15 DIAGNOSIS — H35369 Drusen (degenerative) of macula, unspecified eye: Secondary | ICD-10-CM | POA: Diagnosis not present

## 2013-06-15 DIAGNOSIS — H52229 Regular astigmatism, unspecified eye: Secondary | ICD-10-CM | POA: Diagnosis not present

## 2013-06-19 DIAGNOSIS — M19049 Primary osteoarthritis, unspecified hand: Secondary | ICD-10-CM | POA: Diagnosis not present

## 2013-06-19 DIAGNOSIS — M810 Age-related osteoporosis without current pathological fracture: Secondary | ICD-10-CM | POA: Diagnosis not present

## 2013-06-19 DIAGNOSIS — M25569 Pain in unspecified knee: Secondary | ICD-10-CM | POA: Diagnosis not present

## 2013-06-19 DIAGNOSIS — M542 Cervicalgia: Secondary | ICD-10-CM | POA: Diagnosis not present

## 2013-06-20 ENCOUNTER — Other Ambulatory Visit: Payer: Self-pay | Admitting: Orthopedic Surgery

## 2013-06-20 ENCOUNTER — Other Ambulatory Visit: Payer: Self-pay

## 2013-06-20 ENCOUNTER — Telehealth: Payer: Self-pay | Admitting: Gynecology

## 2013-06-20 ENCOUNTER — Encounter: Payer: Self-pay | Admitting: Gynecology

## 2013-06-20 DIAGNOSIS — Z780 Asymptomatic menopausal state: Secondary | ICD-10-CM

## 2013-06-20 NOTE — Telephone Encounter (Signed)
Pt needs an order sent to the breast center for bone density scan.

## 2013-06-20 NOTE — Telephone Encounter (Signed)
Last BMD 08-2010. -1.9/-1.0.  Pt saw CR for Aex 04-2013 and plan was to repeat this year. Will enter order for [facility].  Call to pt to advise order was sent and she can call tomorrow and make appt at her convenience.

## 2013-06-29 ENCOUNTER — Other Ambulatory Visit: Payer: Self-pay

## 2013-06-29 ENCOUNTER — Other Ambulatory Visit: Payer: Self-pay | Admitting: Gynecology

## 2013-06-29 DIAGNOSIS — Z1231 Encounter for screening mammogram for malignant neoplasm of breast: Secondary | ICD-10-CM

## 2013-07-04 DIAGNOSIS — Z23 Encounter for immunization: Secondary | ICD-10-CM | POA: Diagnosis not present

## 2013-07-13 ENCOUNTER — Other Ambulatory Visit: Payer: Self-pay

## 2013-07-31 ENCOUNTER — Other Ambulatory Visit: Payer: Medicare Other

## 2013-07-31 ENCOUNTER — Ambulatory Visit: Payer: Medicare Other

## 2013-08-02 ENCOUNTER — Ambulatory Visit (INDEPENDENT_AMBULATORY_CARE_PROVIDER_SITE_OTHER): Payer: Medicare Other | Admitting: General Surgery

## 2013-08-15 DIAGNOSIS — L719 Rosacea, unspecified: Secondary | ICD-10-CM | POA: Diagnosis not present

## 2013-08-18 DIAGNOSIS — T8544XA Capsular contracture of breast implant, initial encounter: Secondary | ICD-10-CM | POA: Diagnosis not present

## 2013-08-18 DIAGNOSIS — C50919 Malignant neoplasm of unspecified site of unspecified female breast: Secondary | ICD-10-CM | POA: Diagnosis not present

## 2013-09-05 ENCOUNTER — Ambulatory Visit
Admission: RE | Admit: 2013-09-05 | Discharge: 2013-09-05 | Disposition: A | Discharge status: Home / Self Care | Payer: Medicare Other | Source: Ambulatory Visit | Attending: Gynecology | Admitting: Gynecology

## 2013-09-05 ENCOUNTER — Ambulatory Visit
Admission: RE | Admit: 2013-09-05 | Discharge: 2013-09-05 | Disposition: A | Discharge status: Home / Self Care | Payer: Medicare Other | Source: Ambulatory Visit

## 2013-09-05 DIAGNOSIS — Z1231 Encounter for screening mammogram for malignant neoplasm of breast: Secondary | ICD-10-CM

## 2013-09-05 DIAGNOSIS — Z780 Asymptomatic menopausal state: Secondary | ICD-10-CM

## 2013-09-05 DIAGNOSIS — M899 Disorder of bone, unspecified: Secondary | ICD-10-CM | POA: Diagnosis not present

## 2013-10-03 DIAGNOSIS — C50919 Malignant neoplasm of unspecified site of unspecified female breast: Secondary | ICD-10-CM | POA: Diagnosis not present

## 2013-10-03 DIAGNOSIS — H353190 Nonexudative age-related macular degeneration, unspecified eye, stage unspecified: Secondary | ICD-10-CM | POA: Diagnosis not present

## 2013-10-03 DIAGNOSIS — H35369 Drusen (degenerative) of macula, unspecified eye: Secondary | ICD-10-CM | POA: Diagnosis not present

## 2013-10-16 ENCOUNTER — Telehealth: Payer: Self-pay | Admitting: Oncology

## 2013-10-16 NOTE — Telephone Encounter (Signed)
Gave pt appt for lab and Md  for March 2015  °

## 2013-10-17 ENCOUNTER — Other Ambulatory Visit: Payer: Self-pay | Admitting: *Deleted

## 2013-10-17 DIAGNOSIS — C50919 Malignant neoplasm of unspecified site of unspecified female breast: Secondary | ICD-10-CM

## 2013-10-17 MED ORDER — IBANDRONATE SODIUM 150 MG PO TABS: 150.0000 mg | ORAL_TABLET | ORAL | Status: DC

## 2013-11-09 ENCOUNTER — Other Ambulatory Visit: Payer: Medicare Other

## 2013-11-20 DIAGNOSIS — L259 Unspecified contact dermatitis, unspecified cause: Secondary | ICD-10-CM | POA: Diagnosis not present

## 2013-11-20 DIAGNOSIS — IMO0002 Reserved for concepts with insufficient information to code with codable children: Secondary | ICD-10-CM | POA: Diagnosis not present

## 2013-11-27 ENCOUNTER — Ambulatory Visit (HOSPITAL_BASED_OUTPATIENT_CLINIC_OR_DEPARTMENT_OTHER): Payer: Medicare Other | Admitting: Hematology and Oncology

## 2013-11-27 ENCOUNTER — Encounter: Payer: Self-pay | Admitting: Hematology and Oncology

## 2013-11-27 ENCOUNTER — Other Ambulatory Visit (HOSPITAL_BASED_OUTPATIENT_CLINIC_OR_DEPARTMENT_OTHER): Payer: Medicare Other

## 2013-11-27 VITALS — BP 115/78 | HR 89 | Temp 98.9°F | Resp 18 | Ht 63.75 in | Wt 136.0 lb

## 2013-11-27 DIAGNOSIS — C50419 Malignant neoplasm of upper-outer quadrant of unspecified female breast: Secondary | ICD-10-CM

## 2013-11-27 DIAGNOSIS — M8580 Other specified disorders of bone density and structure, unspecified site: Secondary | ICD-10-CM

## 2013-11-27 DIAGNOSIS — C50919 Malignant neoplasm of unspecified site of unspecified female breast: Secondary | ICD-10-CM | POA: Diagnosis not present

## 2013-11-27 DIAGNOSIS — C50911 Malignant neoplasm of unspecified site of right female breast: Secondary | ICD-10-CM

## 2013-11-27 DIAGNOSIS — Z170 Estrogen receptor positive status [ER+]: Secondary | ICD-10-CM

## 2013-11-27 LAB — CBC WITH DIFFERENTIAL/PLATELET
BASO%: 1.2 % (ref 0.0–2.0)
Basophils Absolute: 0.1 10*3/uL (ref 0.0–0.1)
EOS%: 2.7 % (ref 0.0–7.0)
Eosinophils Absolute: 0.2 10*3/uL (ref 0.0–0.5)
HCT: 43.6 % (ref 34.8–46.6)
HGB: 14.5 g/dL (ref 11.6–15.9)
LYMPH%: 33.4 % (ref 14.0–49.7)
MCH: 32 pg (ref 25.1–34.0)
MCHC: 33.3 g/dL (ref 31.5–36.0)
MCV: 95.9 fL (ref 79.5–101.0)
MONO#: 0.7 10*3/uL (ref 0.1–0.9)
MONO%: 10.4 % (ref 0.0–14.0)
NEUT#: 3.3 10*3/uL (ref 1.5–6.5)
NEUT%: 52.3 % (ref 38.4–76.8)
Platelets: 351 10*3/uL (ref 145–400)
RBC: 4.54 10*6/uL (ref 3.70–5.45)
RDW: 14 % (ref 11.2–14.5)
WBC: 6.3 10*3/uL (ref 3.9–10.3)
lymph#: 2.1 10*3/uL (ref 0.9–3.3)

## 2013-11-27 LAB — COMPREHENSIVE METABOLIC PANEL (CC13)
ALT: 14 U/L (ref 0–55)
AST: 14 U/L (ref 5–34)
Albumin: 4 g/dL (ref 3.5–5.0)
Alkaline Phosphatase: 96 U/L (ref 40–150)
Anion Gap: 9 mEq/L (ref 3–11)
BUN: 25.8 mg/dL (ref 7.0–26.0)
CO2: 27 mEq/L (ref 22–29)
Calcium: 9.4 mg/dL (ref 8.4–10.4)
Chloride: 105 mEq/L (ref 98–109)
Creatinine: 0.9 mg/dL (ref 0.6–1.1)
Glucose: 100 mg/dl (ref 70–140)
Potassium: 4.7 mEq/L (ref 3.5–5.1)
Sodium: 142 mEq/L (ref 136–145)
Total Bilirubin: 0.21 mg/dL (ref 0.20–1.20)
Total Protein: 6.9 g/dL (ref 6.4–8.3)

## 2013-11-27 NOTE — Progress Notes (Signed)
[name]  OFFICE PROGRESS NOTE  CC  Horton Finer, MD [address], [address] [place] [address] Dr. Autumn Messing Dr. Crissie Reese Dr. Caren Griffins Romine Dr. Kyung Rudd Dr. 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 This is in 2011.   #2 patient went on to receive neoadjuvant chemotherapy consisting of FEC 100 followed by 7 weeks of single agent Taxol.  #3 patient  had mastectomy of the right breast with the final pathology revealing a T1 C. N0 disease.  #4 patient has had right breast reconstruction performed by Dr. Crissie Reese.  #5 she was initially on letrozole 2.5 mg daily since August 2012. She subsequently discontinued this and we started her on tamoxifen. She thereafter discontinued that as well and was started on Arimidex. She stopped this for a few days and then went back on starting on January 2013.  CURRENT THERAPY: Arimidex 1 mg daily.  INTERVAL HISTORY: [name] 68 y.o. female returns for visit today.She is doing well.She does have hot flashes about 3 a day and continues on all her medicines as reviewed. No fevers or chills, no aches or pains, no nausea or vomiting. She reports her right breast implant is uncomfortable she cannot stretch her right arm well and she is always aware of it.She was seen for that reason at [facility] was told it is encapsulated, and by Dr.Bowers.She has been at the [facility] clinic. She is having difficulty sleeping and uses xanax to help her sleep.    Remainder of the 10 point review of systems is negative.  MEDICAL HISTORY: Past Medical History  Diagnosis Date  . Arthritis   . Hearing loss 2005    Right ear  . Menopause   . Hormone replacement therapy (postmenopausal)   . Colon  polyps 2001  . S/P breast augmentation 1987  . Herniated disc 2003    L5-SI  . Complex endometrial hyperplasia without atypia 2000  . Breast cancer     ALLERGIES:  is allergic to dilaudid and sulfa antibiotics.  MEDICATIONS:  Current Outpatient Prescriptions  Medication Sig Dispense Refill  . fluticasone (FLONASE) 50 MCG/ACT nasal spray Place 2 sprays into the nose daily.      [name] venlafaxine XR (EFFEXOR-XR) 75 MG 24 hr capsule TAKE 1 CAPSULE BY MOUTH DAILY  90 capsule  3  . acetaminophen (TYLENOL) 500 MG tablet Take by mouth.      . Acetaminophen-Caffeine (EXCEDRIN TENSION HEADACHE PO) Take by mouth as needed.      [name] anastrozole (ARIMIDEX) 1 MG tablet Take 1 tablet (1 mg total) by mouth daily.  90 tablet  6  . Bioflavonoid Products (PERIDIN-C PO) Take 2 tablets by mouth 2 (two) times daily.      . Calcium Carbonate (CALTRATE 600 PO) Take by mouth.      . diphenhydrAMINE (BENADRYL) 50 MG capsule Take by mouth.      . doxylamine, Sleep, (UNISOM) 25 MG tablet Take 25 mg by mouth at bedtime as needed.      . fish oil-omega-3 fatty acids 1000 MG capsule Take 2 g by mouth daily.      . Flaxseed, Linseed, (FLAX SEEDS PO) Take 1,000 mg by mouth 2 (two) times daily.      . Flaxseed, Linseed, (FLAXSEED  OIL) 1000 MG CAPS Use 1 tablet 2 (two) times daily.      . fluconazole (DIFLUCAN) 150 MG tablet Take 1 tablet (150 mg total) by mouth once.  1 tablet  0  . gabapentin (NEURONTIN) 100 MG capsule Take 100 mg by mouth 2 (two) times daily.        . Glucosamine-Chondroitin (OSTEO BI-FLEX REGULAR STRENGTH PO) Take by mouth 2 (two) times daily.       . hydrocortisone (ANUSOL-HC) 25 MG suppository Place 1 suppository (25 mg total) rectally 2 (two) times daily.  12 suppository  0  . ibandronate (BONIVA) 150 MG tablet Take 1 tablet (150 mg total) by mouth every 30 (thirty) days.  3 tablet  3  . ibandronate (BONIVA) 150 MG tablet Take by mouth.      [name] ibuprofen (ADVIL,MOTRIN) 800 MG tablet Take by mouth.       . IBUPROFEN PO Take by mouth as needed.      . nystatin-triamcinolone ointment (MYCOLOG) Apply topically 2 (two) times daily.  30 g  0  . Omega-3 Fatty Acids (FISH OIL) 1000 MG CAPS Take by mouth.      . Probiotic Product (PROBIOTIC DAILY PO) Take by mouth.      . ranitidine (ZANTAC) 150 MG tablet Take by mouth.      . saccharomyces boulardii (FLORASTOR) 250 MG capsule Take by mouth.      . Vitamin D, Ergocalciferol, (DRISDOL) 50000 UNITS CAPS TAKE 1 CAPSULE BY MOUTH EVERY OTHER WEEK  26 capsule  0   No current facility-administered medications for this visit.    SURGICAL HISTORY:  Past Surgical History  Procedure Laterality Date  . Left index finger fusion      10/07; 3/08  . Breast surgery      bilateral breast augmentation  . Breast surgery  03/13/11    right mastectomy-sln,ERPR+,Her2-, Reconstruction  . Salpingectomy Right     Ectopic  . Tubal ligation    . 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   PHYSICAL EXAMINATION: General appearance: alert, cooperative and appears stated age Lymph nodes: Cervical, supraclavicular, and axillary nodes normal. Resp: clear to auscultation bilaterally and normal percussion bilaterally Cardio: regular rate and rhythm, S1, S2 normal, no murmur, click, rub or gallop GI: soft, non-tender; bowel sounds normal; no masses,  no organomegaly Extremities: extremities normal, atraumatic, no cyanosis or edema Neurologic: Alert and oriented X 3, normal strength and tone. Normal symmetric reflexes. Normal coordination and gait Breast examination left breast no masses nipple discharge or inversion she has had a lift performed. Right reconstructed breast skin is well healed so is the surgical scar.[name] ECOG PERFORMANCE STATUS: 1 - Symptomatic but completely ambulatory  Blood pressure 115/78, pulse 89, temperature 98.9 F (37.2 C), temperature source Oral, resp. rate 18, height 5' 3.75" (1.619  m), weight 136 lb (61.689 kg).  LABORATORY DATA: Lab Results  Component Value Date   WBC 6.3 11/27/2013   HGB 14.5 11/27/2013   HCT 43.6 11/27/2013   MCV 95.9 11/27/2013   PLT 351 11/27/2013      Chemistry      Component Value Date/Time   NA 142 11/27/2013 1346   NA 142 10/19/2011 1106   K 4.7 11/27/2013 1346   K 4.3 10/19/2011 1106   CL 104 11/01/2012 1055   CL 106 10/19/2011 1106   CO2 27 11/27/2013 1346   CO2  29 10/19/2011 1106   BUN 25.8 11/27/2013 1346   BUN 23 10/19/2011 1106   CREATININE 0.9 11/27/2013 1346   CREATININE 0.82 10/19/2011 1106      Component Value Date/Time   CALCIUM 9.4 11/27/2013 1346   CALCIUM 9.7 10/19/2011 1106   ALKPHOS 96 11/27/2013 1346   ALKPHOS 95 10/19/2011 1106   AST 14 11/27/2013 1346   AST 15 10/19/2011 1106   ALT 14 11/27/2013 1346   ALT 14 10/19/2011 1106   BILITOT 0.21 11/27/2013 1346   BILITOT 0.3 10/19/2011 1106       RADIOGRAPHIC STUDIES:  No results found.  ASSESSMENT: 68 year old female with  #1 originally stage II low-grade invasive ductal carcinoma ER positive PR positive HER-2/neu negative. Patient is status post neoadjuvant chemotherapy followed by right mastectomy. Her 2 patient was started on tamoxifen initially but she couldn't tolerate it so than she went on to be on letrozole which she could not tolerate either she is now on Arimidex 1 mg daily.continue on meds for hot flashes same.  #2 patient has had right reconstructed breast and reconstruction site looks  unchanged.Patient will contact again Dr.bowers for options,given information for [facility] clinic. Mammogram benign 08/2013.  #3 Bone Density 08/2013 low bone mass patient to continue on Boniva.(on for over 2 years)To take Calcium 600 mg 2 daily and Vit D 50000 IU one every other week.Keep dental follow up  Follow up in 6 months with CBC,CMP,Vit D level.   PLAN:  1. Continue arimidex 1 mg daily  2. See patientt back in 6 months time or sooner if need arises  3. She'll continue to  have yearly mammograms on the left breast   All questions were answered. The patient knows to call the clinic with any problems, questions or concerns. We can certainly see the patient much sooner if necessary.  I spent 25 minutes counseling the patient face to face. The total time spent in the appointment was 30 minutes.  Amada Kingfisher, M.D. Oncology/Hematology [place] [phone] (Office)    11/27/2013, 3:35 PM

## 2013-11-28 ENCOUNTER — Telehealth: Payer: Self-pay | Admitting: *Deleted

## 2013-11-28 NOTE — Telephone Encounter (Signed)
Per 3/23 POF I have scheduled appts for six months. I have called the patient and gave her appts. I also mailed her a calender.  JMW

## 2014-01-03 ENCOUNTER — Other Ambulatory Visit: Payer: Self-pay | Admitting: Gynecology

## 2014-01-03 DIAGNOSIS — N898 Other specified noninflammatory disorders of vagina: Secondary | ICD-10-CM

## 2014-01-03 DIAGNOSIS — R21 Rash and other nonspecific skin eruption: Secondary | ICD-10-CM

## 2014-01-03 NOTE — Telephone Encounter (Addendum)
Incoming fax requesting refill Nystatin - Triamcinolone  Last AEX 04/04/2013 Last refill 05/02/2013 #30 g/ 0 refills Next appt 04/06/2014  Please approve or deny Rx.

## 2014-01-03 NOTE — Telephone Encounter (Signed)
Pt says pharmacy contacted office regarding her refill on the nystatin triamcinolone.

## 2014-01-04 ENCOUNTER — Telehealth: Payer: Self-pay | Admitting: Gynecology

## 2014-01-04 NOTE — Telephone Encounter (Signed)
Pt diagnosed with yeast dermatitis 64m ago, has she been using the ointment regularly since? She should have ov to confirm diagnosis

## 2014-01-04 NOTE — Telephone Encounter (Signed)
-   LM for patient to call back re: Rx refill.

## 2014-01-04 NOTE — Telephone Encounter (Signed)
Patient returning Reina's phone call re:    Elroy Channel, CMA at 01/04/2014 10:08 AM    Status: Signed        - LM for patient to call back re: Rx refill.     Original phone note appears to be closed in error.

## 2014-01-05 NOTE — Telephone Encounter (Signed)
Patient states she has the same fungal infection from last time she states " it's a little spotty rash." Patient states she doesn't have time to come in. Advise patient I will route this message to Dr. Charlies Constable and see if she would refill Rx but more likely patient will need to come in for an OV. - Patient agreed.

## 2014-01-05 NOTE — Telephone Encounter (Signed)
Patient states she has the same fungal infection from last time she states " it's a little spotty rash." Patient states she doesn't have time to come in. Advise patient I will route this message to Dr. Lathrop and see if she would refill Rx but more likely patient will need to come in for an OV. - Patient agreed.   

## 2014-01-05 NOTE — Telephone Encounter (Signed)
Original refill encounter from 4/29 sent to Dr. Charlies Constable.

## 2014-01-10 NOTE — Telephone Encounter (Signed)
i will refill at smaller size, if she does not improve will need to be seen

## 2014-01-22 DIAGNOSIS — K1370 Unspecified lesions of oral mucosa: Secondary | ICD-10-CM | POA: Diagnosis not present

## 2014-01-24 ENCOUNTER — Other Ambulatory Visit: Payer: Self-pay | Admitting: Otolaryngology

## 2014-01-24 DIAGNOSIS — K121 Other forms of stomatitis: Secondary | ICD-10-CM | POA: Diagnosis not present

## 2014-01-24 DIAGNOSIS — B370 Candidal stomatitis: Secondary | ICD-10-CM | POA: Diagnosis not present

## 2014-01-24 DIAGNOSIS — K1230 Oral mucositis (ulcerative), unspecified: Secondary | ICD-10-CM | POA: Diagnosis not present

## 2014-01-24 DIAGNOSIS — K1370 Unspecified lesions of oral mucosa: Secondary | ICD-10-CM | POA: Diagnosis not present

## 2014-01-31 DIAGNOSIS — K1370 Unspecified lesions of oral mucosa: Secondary | ICD-10-CM | POA: Diagnosis not present

## 2014-01-31 DIAGNOSIS — B370 Candidal stomatitis: Secondary | ICD-10-CM | POA: Diagnosis not present

## 2014-02-06 DIAGNOSIS — R21 Rash and other nonspecific skin eruption: Secondary | ICD-10-CM | POA: Diagnosis not present

## 2014-02-06 DIAGNOSIS — M810 Age-related osteoporosis without current pathological fracture: Secondary | ICD-10-CM | POA: Diagnosis not present

## 2014-02-06 DIAGNOSIS — M25549 Pain in joints of unspecified hand: Secondary | ICD-10-CM | POA: Diagnosis not present

## 2014-02-06 DIAGNOSIS — M19049 Primary osteoarthritis, unspecified hand: Secondary | ICD-10-CM | POA: Diagnosis not present

## 2014-02-09 DIAGNOSIS — L2089 Other atopic dermatitis: Secondary | ICD-10-CM | POA: Diagnosis not present

## 2014-02-09 DIAGNOSIS — L259 Unspecified contact dermatitis, unspecified cause: Secondary | ICD-10-CM | POA: Diagnosis not present

## 2014-02-09 DIAGNOSIS — K130 Diseases of lips: Secondary | ICD-10-CM | POA: Diagnosis not present

## 2014-02-16 ENCOUNTER — Other Ambulatory Visit: Payer: Self-pay | Admitting: *Deleted

## 2014-02-16 NOTE — Telephone Encounter (Signed)
Fax From: [facility] for Vitamin D 50,000 Last Refilled: 04/04/13 #26/0refillls Aex Scheduled: 04/06/14  Last Vit D Level-56  Per Dr. Charlies Constable patient can go on otc Vitamin D at [phone]'s  Left Message To Call Back

## 2014-02-19 NOTE — Telephone Encounter (Signed)
Patient notified aware that she can take Vitamin D otc at 800-1000iu's

## 2014-03-02 DIAGNOSIS — K1370 Unspecified lesions of oral mucosa: Secondary | ICD-10-CM | POA: Diagnosis not present

## 2014-03-02 DIAGNOSIS — B370 Candidal stomatitis: Secondary | ICD-10-CM | POA: Diagnosis not present

## 2014-03-26 ENCOUNTER — Other Ambulatory Visit: Payer: Self-pay

## 2014-03-26 MED ORDER — VENLAFAXINE HCL ER 75 MG PO CP24
ORAL_CAPSULE | ORAL | Status: DC
Start: 2014-03-26 — End: 2014-04-06

## 2014-03-26 NOTE — Telephone Encounter (Signed)
Last AEX;04/04/13 Last refill:04/04/13 #90, 3 rf Current AEX:04/06/14 Called pt and she stated she only has a week left.  I will sent 1 mth supply to cover until her AEX Encounter closed

## 2014-04-05 ENCOUNTER — Ambulatory Visit: Payer: No Typology Code available for payment source | Admitting: Gynecology

## 2014-04-06 ENCOUNTER — Encounter: Payer: Self-pay | Admitting: Gynecology

## 2014-04-06 ENCOUNTER — Ambulatory Visit (INDEPENDENT_AMBULATORY_CARE_PROVIDER_SITE_OTHER): Payer: Medicare Other | Admitting: Gynecology

## 2014-04-06 VITALS — BP 124/76 | HR 80 | Resp 18 | Ht 63.5 in | Wt 136.0 lb

## 2014-04-06 DIAGNOSIS — Z0000 Encounter for general adult medical examination without abnormal findings: Secondary | ICD-10-CM

## 2014-04-06 DIAGNOSIS — C50911 Malignant neoplasm of unspecified site of right female breast: Secondary | ICD-10-CM

## 2014-04-06 DIAGNOSIS — Z01419 Encounter for gynecological examination (general) (routine) without abnormal findings: Secondary | ICD-10-CM | POA: Diagnosis not present

## 2014-04-06 DIAGNOSIS — C50919 Malignant neoplasm of unspecified site of unspecified female breast: Secondary | ICD-10-CM | POA: Diagnosis not present

## 2014-04-06 LAB — POCT URINALYSIS DIPSTICK
Urobilinogen, UA: NEGATIVE
pH, UA: 5

## 2014-04-06 LAB — HEMOGLOBIN, FINGERSTICK: Hemoglobin, fingerstick: 14.3 g/dL (ref 12.0–16.0)

## 2014-04-06 MED ORDER — VENLAFAXINE HCL ER 75 MG PO CP24: ORAL_CAPSULE | ORAL | Status: DC

## 2014-04-06 NOTE — Addendum Note (Signed)
Addended by: Alfonzo Feller on: 04/06/2014 04:40 PM   Modules accepted: Orders

## 2014-04-06 NOTE — Progress Notes (Signed)
68 y.o. Married Caucasian female   G1P0010 here for annual exam. Pt reports menses are absent due to Menopause.  She does report hot flashes, does not have night sweats, does not have vaginal dryness.  She is not using lubricants.  She does not report post-menopasual bleeding.  Pt feels terrible on arimidex, and would like to stop, is having hot flashes.  Pt has not been sexually active since chemo.  No LMP recorded. Patient is postmenopausal.          Sexually active: Yes.    The current method of family planning is tubal ligation and post menopausal status.    Exercising: Yes.    walking,yoga,yardwork  Couple days, 1x, qd Last pap: 01/27/2010  Abnormal PAP: no  Mammogram: 09/08/13 Bi-Rads 1 BSE: yes Colonoscopy: 2012 per pt; 2006 normal f/u q 5 years DEXA: 09/18/13   Alcohol: 1 cocktail q evening Tobacco: 1/2 pack qd  Hgb: 14.3 ; Urine: :Leuks 2  Health Maintenance  Topic Date Due  . Zostavax  03/10/2006  . Pneumococcal Polysaccharide Vaccine Age 13 And Over  03/11/2011  . Influenza Vaccine  04/07/2014  . Colonoscopy  09/07/2014  . Mammogram  09/06/2015  . Tetanus/tdap  04/07/2023    Family History  Problem Relation Age of Onset  . Breast cancer Mother 31  . Lupus Sister     Patient Active Problem List   Diagnosis Date Noted  . Breast cancer 03/23/2011    Past Medical History  Diagnosis Date  . Arthritis   . Hearing loss 2005    Right ear  . Menopause   . Hormone replacement therapy (postmenopausal)   . Colon polyps 2001  . S/P breast augmentation 1987  . Herniated disc 2003    L5-SI  . Complex endometrial hyperplasia without atypia 2000  . Breast cancer     Past Surgical History  Procedure Laterality Date  . Left index finger fusion      10/07; 3/08  . Breast surgery      bilateral breast augmentation  . Breast surgery  03/13/11    right mastectomy-sln,ERPR+,Her2-, Reconstruction  . Salpingectomy Right     Ectopic  . Tubal ligation    . Appendectomy  1983     LSO- Serous cystoadenoma    Allergies: Dilaudid and Sulfa antibiotics  Current Outpatient Prescriptions  Medication Sig Dispense Refill  . acetaminophen (TYLENOL) 500 MG tablet Take by mouth.      . Acetaminophen-Caffeine (EXCEDRIN TENSION HEADACHE PO) Take by mouth as needed.      [name] anastrozole (ARIMIDEX) 1 MG tablet Take 1 tablet (1 mg total) by mouth daily.  90 tablet  6  . Bioflavonoid Products (PERIDIN-C PO) Take 2 tablets by mouth 2 (two) times daily.      . Calcium Carbonate (CALTRATE 600 PO) Take by mouth.      . diphenhydrAMINE (BENADRYL) 50 MG capsule Take by mouth.      . doxylamine, Sleep, (UNISOM) 25 MG tablet Take 25 mg by mouth at bedtime as needed.      . fish oil-omega-3 fatty acids 1000 MG capsule Take 2 g by mouth daily.      . Flaxseed, Linseed, (FLAX SEEDS PO) Take 1,000 mg by mouth 2 (two) times daily.      . Flaxseed, Linseed, (FLAXSEED OIL) 1000 MG CAPS Use 1 tablet 2 (two) times daily.      . fluconazole (DIFLUCAN) 150 MG tablet Take 1 tablet (150 mg total) by mouth  once.  1 tablet  0  . fluticasone (FLONASE) 50 MCG/ACT nasal spray Place 2 sprays into the nose daily.      [name] gabapentin (NEURONTIN) 100 MG capsule Take 100 mg by mouth 2 (two) times daily.        . Glucosamine-Chondroitin (OSTEO BI-FLEX REGULAR STRENGTH PO) Take by mouth 2 (two) times daily.       . hydrocortisone (ANUSOL-HC) 25 MG suppository Place 1 suppository (25 mg total) rectally 2 (two) times daily.  12 suppository  0  . ibandronate (BONIVA) 150 MG tablet Take 1 tablet (150 mg total) by mouth every 30 (thirty) days.  3 tablet  3  . ibandronate (BONIVA) 150 MG tablet Take by mouth.      [name] ibuprofen (ADVIL,MOTRIN) 800 MG tablet Take by mouth.      . IBUPROFEN PO Take by mouth as needed.      . nystatin-triamcinolone ointment (MYCOLOG) Apply topically 2 (two) times daily.  30 g  0  . Omega-3 Fatty Acids (FISH OIL) 1000 MG CAPS Take by mouth.      . Probiotic Product (PROBIOTIC DAILY PO) Take  by mouth.      . ranitidine (ZANTAC) 150 MG tablet Take by mouth.      . saccharomyces boulardii (FLORASTOR) 250 MG capsule Take by mouth.      . venlafaxine XR (EFFEXOR-XR) 75 MG 24 hr capsule TAKE 1 CAPSULE BY MOUTH DAILY  30 capsule  0  . Vitamin D, Ergocalciferol, (DRISDOL) 50000 UNITS CAPS TAKE 1 CAPSULE BY MOUTH EVERY OTHER WEEK  26 capsule  0   No current facility-administered medications for this visit.    ROS: Pertinent items are noted in HPI.  Exam:    There were no vitals taken for this visit. Weight change: '@WEIGHTCHANGE' @ Last 3 height recordings:  Ht Readings from Last 3 Encounters:  11/27/13 5' 3.75" (1.619 m)  05/17/13 5' 3.75" (1.619 m)  05/04/13 5' 3.75" (1.619 m)   General appearance: alert, cooperative and appears stated age Head: Normocephalic, without obvious abnormality, atraumatic Neck: no adenopathy, no carotid bruit, no JVD, supple, symmetrical, trachea midline and thyroid not enlarged, symmetric, no tenderness/mass/nodules Lungs: clear to auscultation bilaterally Breasts: normal appearance, no masses or tenderness, right mastectomy with reconstruction Heart: regular rate and rhythm, S1, S2 normal, no murmur, click, rub or gallop Abdomen: soft, non-tender; bowel sounds normal; no masses,  no organomegaly Extremities: extremities normal, atraumatic, no cyanosis or edema Skin: Skin color, texture, turgor normal. No rashes or lesions Lymph nodes: Cervical, supraclavicular, and axillary nodes normal. no inguinal nodes palpated Neurologic: Grossly normal   Pelvic: External genitalia:  no lesions              Urethra: normal appearing urethra with no masses, tenderness or lesions              Bartholins and Skenes: Bartholin's, Urethra, Skene's normal                 Vagina: atrophic              Cervix: normal appearance              Pap taken: No.        Bimanual Exam:  Uterus:  uterus is normal size, shape, consistency and nontender  Adnexa:    normal adnexa in size, nontender and no masses                                      Rectovaginal: Confirms                                      Anus:  normal sphincter tone, no lesions    1. Routine gynecological examination  counseled on breast self exam, mammography screening,  Osteopenia-pt took herself off boniva, is still on arimidex, aware it can cause osteoporosis-recommend she discuss with oncologist, would like to stop her arimidex adequate intake of calcium and vitamin D, diet and exercise return annually or prn Discussed PAP guideline changes, importance of weight bearing exercises, calcium, vit D and balanced diet.    2. Laboratory examination ordered as part of a routine general medical examination  - POCT Urinalysis Dipstick - Hemoglobin, fingerstick  An After Visit Summary was printed and given to the patient.

## 2014-04-13 DIAGNOSIS — H35369 Drusen (degenerative) of macula, unspecified eye: Secondary | ICD-10-CM | POA: Diagnosis not present

## 2014-04-13 DIAGNOSIS — H3530 Unspecified macular degeneration: Secondary | ICD-10-CM | POA: Diagnosis not present

## 2014-04-20 ENCOUNTER — Telehealth: Payer: Self-pay | Admitting: Oncology

## 2014-04-20 NOTE — Telephone Encounter (Signed)
PER MESSAGE FROM JEFF H FORMER PT OF [facility] REQ APPT W/GM. PER JEFF CONTACT PT ASAP. S/W PT NEW APPT FOR LB/GM 11/25. PT HAS NEW D/T./

## 2014-04-24 ENCOUNTER — Telehealth: Payer: Self-pay | Admitting: *Deleted

## 2014-04-24 NOTE — Telephone Encounter (Signed)
This RN returned call per message left by pt to call back number of [phone].  Obtained identified VM- requested a call back to this RN

## 2014-05-18 ENCOUNTER — Telehealth: Payer: Self-pay | Admitting: *Deleted

## 2014-05-18 NOTE — Telephone Encounter (Signed)
This RN returned call to pt per her message today stating " I am returning a call to [place]"  Note this RN called on 04/24/2014 per her call and left message at that time to return call. No recent calls made by this RN to patient.  This RN returned call and obtained identified VM. Message left requesting pt to return call to Dr [facility] nurse per her concerns.

## 2014-06-01 ENCOUNTER — Other Ambulatory Visit: Payer: Medicare Other

## 2014-06-01 ENCOUNTER — Ambulatory Visit: Payer: Medicare Other | Admitting: Oncology

## 2014-07-09 ENCOUNTER — Encounter: Payer: Self-pay | Admitting: Gynecology

## 2014-08-01 ENCOUNTER — Other Ambulatory Visit (HOSPITAL_BASED_OUTPATIENT_CLINIC_OR_DEPARTMENT_OTHER): Payer: Medicare Other

## 2014-08-01 ENCOUNTER — Telehealth: Payer: Self-pay | Admitting: Hematology and Oncology

## 2014-08-01 ENCOUNTER — Other Ambulatory Visit: Payer: Self-pay | Admitting: *Deleted

## 2014-08-01 ENCOUNTER — Ambulatory Visit (HOSPITAL_BASED_OUTPATIENT_CLINIC_OR_DEPARTMENT_OTHER): Payer: Medicare Other | Admitting: Oncology

## 2014-08-01 VITALS — BP 145/85 | HR 77 | Temp 98.4°F | Resp 18 | Ht 63.5 in | Wt 135.4 lb

## 2014-08-01 DIAGNOSIS — C50911 Malignant neoplasm of unspecified site of right female breast: Secondary | ICD-10-CM

## 2014-08-01 DIAGNOSIS — C50811 Malignant neoplasm of overlapping sites of right female breast: Secondary | ICD-10-CM | POA: Diagnosis not present

## 2014-08-01 DIAGNOSIS — M8580 Other specified disorders of bone density and structure, unspecified site: Secondary | ICD-10-CM | POA: Diagnosis not present

## 2014-08-01 DIAGNOSIS — C50919 Malignant neoplasm of unspecified site of unspecified female breast: Secondary | ICD-10-CM

## 2014-08-01 DIAGNOSIS — Z170 Estrogen receptor positive status [ER+]: Secondary | ICD-10-CM

## 2014-08-01 LAB — COMPREHENSIVE METABOLIC PANEL (CC13)
ALT: 14 U/L (ref 0–55)
AST: 16 U/L (ref 5–34)
Albumin: 4.3 g/dL (ref 3.5–5.0)
Alkaline Phosphatase: 129 U/L (ref 40–150)
Anion Gap: 10 mEq/L (ref 3–11)
BUN: 16.9 mg/dL (ref 7.0–26.0)
CO2: 27 mEq/L (ref 22–29)
Calcium: 10.2 mg/dL (ref 8.4–10.4)
Chloride: 104 mEq/L (ref 98–109)
Creatinine: 0.9 mg/dL (ref 0.6–1.1)
Glucose: 104 mg/dl (ref 70–140)
Potassium: 4.1 mEq/L (ref 3.5–5.1)
Sodium: 141 mEq/L (ref 136–145)
Total Bilirubin: 0.61 mg/dL (ref 0.20–1.20)
Total Protein: 7.1 g/dL (ref 6.4–8.3)

## 2014-08-01 LAB — CBC WITH DIFFERENTIAL/PLATELET
BASO%: 1 % (ref 0.0–2.0)
Basophils Absolute: 0.1 10*3/uL (ref 0.0–0.1)
EOS%: 2.3 % (ref 0.0–7.0)
Eosinophils Absolute: 0.2 10*3/uL (ref 0.0–0.5)
HCT: 46.2 % (ref 34.8–46.6)
HGB: 15 g/dL (ref 11.6–15.9)
LYMPH%: 29.6 % (ref 14.0–49.7)
MCH: 31.1 pg (ref 25.1–34.0)
MCHC: 32.4 g/dL (ref 31.5–36.0)
MCV: 95.7 fL (ref 79.5–101.0)
MONO#: 0.6 10*3/uL (ref 0.1–0.9)
MONO%: 8.4 % (ref 0.0–14.0)
NEUT#: 4.1 10*3/uL (ref 1.5–6.5)
NEUT%: 58.7 % (ref 38.4–76.8)
Platelets: 378 10*3/uL (ref 145–400)
RBC: 4.82 10*6/uL (ref 3.70–5.45)
RDW: 14 % (ref 11.2–14.5)
WBC: 7 10*3/uL (ref 3.9–10.3)
lymph#: 2.1 10*3/uL (ref 0.9–3.3)
nRBC: 0 % (ref 0–0)

## 2014-08-01 MED ORDER — TAMOXIFEN CITRATE 20 MG PO TABS: 20.0000 mg | ORAL_TABLET | Freq: Every day | ORAL | Status: AC

## 2014-08-01 NOTE — Telephone Encounter (Signed)
per pof to sch pt appt-gave pt copy of sch °

## 2014-08-01 NOTE — Progress Notes (Signed)
[place]  [phone] [phone]     ID: [name] DOB: Jan 25, 1946  MR#: [id number]  MWN#:[id number]  Patient Care Team: Horton Finer, MD as PCP - General (Internal Medicine) Chauncey Cruel, MD as Consulting Physician (Oncology) GYN: Elveria Rising M.D. SU: Star Age MD OTHER MD: Crissie Reese M.D.  CHIEF COMPLAINT: Estrogen receptor positive breast cancer  CURRENT TREATMENT: Anastrozole   BREAST CANCER HISTORY: From Dr. 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 This is in 2011.   #2 patient went on to receive neoadjuvant chemotherapy consisting of FEC 100 followed by 7 weeks of single agent Taxol.  #3 patient A mastectomy of the right breast with the final pathology revealing a T1 C. N0 disease.  #4 patient has had right breast reconstruction performed by Dr. Crissie Reese.  #5 she was initially on letrozole 2.5 mg daily since August 2012. She subsequently discontinued this and we started her on tamoxifen. She thereafter discontinued that as well and was started on Arimidex. She stopped this for a few days and then went back on starting on January 2013.  Her subsequent history is as detailed below  INTERVAL HISTORY: [name] returns today for follow-up of her breast cancer accompanied by her husband Clair Gulling. She is establishing herself on my service today. She had been on anastrozole but stopped it after July of this year. She is feeling better overall, with more energy, and funeral hot flashes. She has been having some vaginal rash issues and those are also improved. She has many questions on whether she should continue on antiestrogens "since my cancer was not invasive".   REVIEW OF SYSTEMS: Aside from the above issues, she has some problems hearing, and needs to have her glasses readjusted. She  complains of back and joint pain Patel's her arthritis is not any more intense or frequent than before. A detailed review of systems today was otherwise noncontributory  PAST MEDICAL HISTORY: Past Medical History  Diagnosis Date  . Arthritis   . Hearing loss 2005    Right ear  . Menopause   . Hormone replacement therapy (postmenopausal)   . Colon polyps 2001  . S/P breast augmentation 1987  . Herniated disc 2003    L5-SI  . Complex endometrial hyperplasia without atypia 2000  . Breast cancer     PAST SURGICAL HISTORY: Past Surgical History  Procedure Laterality Date  . Left index finger fusion      10/07; 3/08  . Breast surgery      bilateral breast augmentation  . Breast surgery  03/13/11    right mastectomy-sln,ERPR+,Her2-, Reconstruction  . Salpingectomy Right     Ectopic  . Tubal ligation    . Appendectomy  1983    LSO- Serous cystoadenoma    FAMILY HISTORY Family History  Problem Relation Age of Onset  . Breast cancer Mother 33  . Lupus Sister    the patient's father died from pancreatic cancer at the age of 2. The patient's mother is currently 70 years old, lives independently in [place]. She was diagnosed with breast cancer in her 53s. The patient had one brother, a twin sister and another sister. There is no other history of breast or ovarian cancer in the family.  GYNECOLOGIC HISTORY:  No LMP recorded. Patient is postmenopausal. Menarche age 81, the patient is GX P0. She took hormone  replacement for approximately 20 years. She went through the change of life approximately age 85.  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 Her husband Clair Gulling sells real estate. They have no children and no pets and are not church attender's.     ADVANCED DIRECTIVES:  living will in place   HEALTH MAINTENANCE: History  Substance Use Topics  . Smoking status: Current Every Day Smoker -- 0.50 packs/day    Types: Cigarettes  . Smokeless  tobacco: Never Used  . Alcohol Use: 3.5 oz/week    7 drink(s) per week     Comment: 1 cocktail q morning     Colonoscopy:  PAP:  Bone density: 09/05/2013 showed a T score of -2.0  Lipid panel:  Allergies  Allergen Reactions  . Dilaudid [Hydromorphone Hcl] Nausea And Vomiting and Other (See Comments)    Gastrointestinal distress  . Sulfa Antibiotics Nausea And Vomiting and Other (See Comments)    Gastrointestinal distress    Current Outpatient Prescriptions  Medication Sig Dispense Refill  . acetaminophen (TYLENOL) 500 MG tablet Take by mouth.    . Acetaminophen-Caffeine (EXCEDRIN TENSION HEADACHE PO) Take by mouth as needed.    [name] anastrozole (ARIMIDEX) 1 MG tablet Take 1 tablet (1 mg total) by mouth daily. 90 tablet 6  . Bioflavonoid Products (PERIDIN-C PO) Take 2 tablets by mouth 2 (two) times daily.    . Calcium Carbonate (CALTRATE 600 PO) Take by mouth.    . Cholecalciferol (VITAMIN D-3) 1000 UNITS CAPS Take by mouth.    . diphenhydrAMINE (BENADRYL) 50 MG capsule Take by mouth.    . doxylamine, Sleep, (UNISOM) 25 MG tablet Take 25 mg by mouth at bedtime as needed.    . fish oil-omega-3 fatty acids 1000 MG capsule Take 2 g by mouth daily.    . Flaxseed, Linseed, (FLAX SEEDS PO) Take 1,000 mg by mouth 2 (two) times daily.    . Flaxseed, Linseed, (FLAXSEED OIL) 1000 MG CAPS Use 1 tablet 2 (two) times daily.    . fluticasone (FLONASE) 50 MCG/ACT nasal spray Place 2 sprays into the nose daily.    [name] gabapentin (NEURONTIN) 100 MG capsule Take 100 mg by mouth 2 (two) times daily.      . Glucosamine-Chondroitin (OSTEO BI-FLEX REGULAR STRENGTH PO) Take by mouth 2 (two) times daily.     [name] ibuprofen (ADVIL,MOTRIN) 800 MG tablet Take by mouth.    . IBUPROFEN PO Take by mouth as needed.    . nystatin-triamcinolone ointment (MYCOLOG) Apply topically 2 (two) times daily. 30 g 0  . Omega-3 Fatty Acids (FISH OIL) 1000 MG CAPS Take by mouth.    . Probiotic Product (PROBIOTIC DAILY PO) Take by  mouth.    . ranitidine (ZANTAC) 150 MG tablet Take by mouth as needed.     . saccharomyces boulardii (FLORASTOR) 250 MG capsule Take by mouth.    . tamoxifen (NOLVADEX) 20 MG tablet Take 1 tablet (20 mg total) by mouth daily. 90 tablet 12  . venlafaxine XR (EFFEXOR-XR) 75 MG 24 hr capsule TAKE 1 CAPSULE BY MOUTH DAILY 90 capsule 3  . Vitamin D, Ergocalciferol, (DRISDOL) 50000 UNITS CAPS TAKE 1 CAPSULE BY MOUTH EVERY OTHER WEEK 26 capsule 0   No current facility-administered medications for this visit.    OBJECTIVE: Middle-aged white woman in no acute distress Filed Vitals:   08/01/14 1435  BP: 145/85  Pulse: 77  Temp: 98.4 F (36.9 C)  Resp: 18  Body mass index is 23.61 kg/(m^2).    ECOG FS:1 - Symptomatic but completely ambulatory  Ocular: Sclerae unicteric, pupils equal, round and reactive to light Ear-nose-throat: Oropharynx clear and moist Lymphatic: No cervical or supraclavicular adenopathy Lungs no rales or rhonchi, good excursion bilaterally Heart regular rate and rhythm, no murmur appreciated Abd soft, nontender, positive bowel sounds MSK no focal spinal tenderness, no upper extremity lymphedema Neuro: non-focal, well-oriented, appropriate affect Breasts: The right breast is status post mastectomy and reconstruction. There is no evidence of local recurrence. The right axilla is benign per the left breast is unremarkable.   LAB RESULTS:  CMP     Component Value Date/Time   NA 141 08/01/2014 1414   NA 142 10/19/2011 1106   K 4.1 08/01/2014 1414   K 4.3 10/19/2011 1106   CL 104 11/01/2012 1055   CL 106 10/19/2011 1106   CO2 27 08/01/2014 1414   CO2 29 10/19/2011 1106   GLUCOSE 104 08/01/2014 1414   GLUCOSE 93 11/01/2012 1055   GLUCOSE 96 10/19/2011 1106   BUN 16.9 08/01/2014 1414   BUN 23 10/19/2011 1106   CREATININE 0.9 08/01/2014 1414   CREATININE 0.82 10/19/2011 1106   CALCIUM 10.2 08/01/2014 1414   CALCIUM 9.7 10/19/2011 1106   PROT 7.1 08/01/2014 1414    PROT 6.5 10/19/2011 1106   ALBUMIN 4.3 08/01/2014 1414   ALBUMIN 4.3 10/19/2011 1106   AST 16 08/01/2014 1414   AST 15 10/19/2011 1106   ALT 14 08/01/2014 1414   ALT 14 10/19/2011 1106   ALKPHOS 129 08/01/2014 1414   ALKPHOS 95 10/19/2011 1106   BILITOT 0.61 08/01/2014 1414   BILITOT 0.3 10/19/2011 1106   GFRNONAA >60 02/26/2011 1034   GFRAA >60 02/26/2011 1034    INo results found for: SPEP, UPEP  Lab Results  Component Value Date   WBC 7.0 08/01/2014   NEUTROABS 4.1 08/01/2014   HGB 15.0 08/01/2014   HCT 46.2 08/01/2014   MCV 95.7 08/01/2014   PLT 378 08/01/2014      Chemistry      Component Value Date/Time   NA 141 08/01/2014 1414   NA 142 10/19/2011 1106   K 4.1 08/01/2014 1414   K 4.3 10/19/2011 1106   CL 104 11/01/2012 1055   CL 106 10/19/2011 1106   CO2 27 08/01/2014 1414   CO2 29 10/19/2011 1106   BUN 16.9 08/01/2014 1414   BUN 23 10/19/2011 1106   CREATININE 0.9 08/01/2014 1414   CREATININE 0.82 10/19/2011 1106      Component Value Date/Time   CALCIUM 10.2 08/01/2014 1414   CALCIUM 9.7 10/19/2011 1106   ALKPHOS 129 08/01/2014 1414   ALKPHOS 95 10/19/2011 1106   AST 16 08/01/2014 1414   AST 15 10/19/2011 1106   ALT 14 08/01/2014 1414   ALT 14 10/19/2011 1106   BILITOT 0.61 08/01/2014 1414   BILITOT 0.3 10/19/2011 1106       Lab Results  Component Value Date   LABCA2 30 09/09/2010    No components found for: [id number]  No results for input(s): INR in the last 168 hours.  Urinalysis    Component Value Date/Time   BILIRUBINUR - 04/06/2014 1346   PROTEINUR - 04/06/2014 1346   UROBILINOGEN negative 04/06/2014 1346   NITRITE - 04/06/2014 1346   LEUKOCYTESUR moderate (2+) 04/06/2014 1346    STUDIES: CLINICAL DATA: Screening.  EXAM: LEFT DIGITAL SCREENING MAMMOGRAM WITH CAD  COMPARISON: Previous exam(s)  ACR Breast Density  Category c: The breasts tissue is heterogeneously dense, which may obscure small  masses.  FINDINGS: No suspicious masses, architectural distortion, or calcifications are present. There are no findings suspicious for malignancy. The patient has had a right mastectomy. The patient has left retropectoral saline implant.  Images were processed with CAD.  IMPRESSION: No mammographic evidence of malignancy. A result letter of this screening mammogram will be mailed directly to the patient.  RECOMMENDATION: Screening mammogram in one year. (Code:[id number]-B-01Y)  BI-RADS CATEGORY 1: Negative   Electronically Signed  By: Shon Hale M.D.  On: 09/08/2013 13:52  ASSESSMENT: 68 y.o. [place] woman   (1) Status post right breast biopsy 08/18/2010(SAA [phone]) for a clinical T1c N0, stage IA invasive ductal carcinoma, grade 1, estrogen receptor 97% positive, progesterone receptor 98% positive, with an MIB-1 of 15% and no HER-2 amplification (ratio 1.60).  (2) treated neoadjuvantly with cyclophosphamide, fluorouracil, and epirubicin in dose dense fashion 4 cycles, completed 10/31/2010, followed (according to the 10/19/2011 oncology note) by 7 of 12 planned doses of weekly paclitaxel  (3) status post right mastectomy and sentinel lymph node sampling 03/03/2011, showing (SZA  [id number]) a pT1c pN0, stage IA invasive ductal carcinoma, grade 1, with ample margins  (4) Letrozole started August 2012, switched to tamoxifen then definitively on anastrozole as of January 2013, discontinued July 2015.  (5) osteopenia, formerly on ibandronate   PLAN: We spent the better part of today's hour-long appointment discussing the biology of breast cancer in general, and the specifics of the patient's tumor in particular. I am not sure why [name]'s thought her tumor was not invasive, but I gave her a copy of her pathology report and we discussed the fact that invasive does not mean the tumor is "all over her body", but only guided found a way to get out of the duct where it started. We  discussed the difference between local and systemic treatment, and she understands having a mastectomy does not mean the cancer cannot come back, since the cancer may have spread to other parts of the body for the surgery was performed.  She had chemotherapy as her main systemic treatment, but she understands that while chemotherapy lowers the risk of recurrence by one third, adequate antiestrogen treatment will lower at by one. As an example, if her risk of recurrence with local treatment only was 30%, then chemotherapy would lower that risks to 20% and adequate antiestrogen is would lower at further to 10%.  We then reviewed, and menopausal symptoms and the fact that it can be difficult to tell the difference between these, which are simply normal part of the aging process, and side effects from anti-estrogens. I gave her this information in writing. In particular, we discussed tamoxifen, since I think this can be helpful to her in a variety of ways. Of course it will lower her risk of recurrence. In addition, while on tamoxifen she would be able to utilize local estrogen preparations for her vaginal dryness problems. Finally, she has osteopenia, close to osteoporosis, and has taken herself off Boniva. Tamoxifen can help with bone density issues  After this discussion she was willing to give tamoxifen a try. Ideally she would take it for another 2 years. We are not going to get going until January 1 however so she can get through the holidays without too many complications. She will see me again in March or April and if she is tolerating tamoxifen well at that time I will start seeing her on a once  a year basis until she completes 2 years of treatment.  The patient has a good understanding of the overall plan. She agrees with it. She knows the goal of treatment in her case is cure. She will call with any problems that may develop before her next visit here.  Chauncey Cruel, MD   08/04/2014 11:40  AM

## 2014-08-07 ENCOUNTER — Other Ambulatory Visit: Payer: Self-pay | Admitting: Gynecology

## 2014-08-07 NOTE — Telephone Encounter (Signed)
Spoke with pt to reschedule annual exam (cancelled from dr lathrop) She requests refill on her effexor  Please call pt.  bf

## 2014-08-07 NOTE — Telephone Encounter (Signed)
Last AEX and refill 04/06/14 #90/3refills sent to [facility].  Pt rescheduled with Mrs [name] 04/09/15  Pt has enough refills to last for 1 year. - Pt notified.

## 2014-08-16 DIAGNOSIS — G629 Polyneuropathy, unspecified: Secondary | ICD-10-CM | POA: Diagnosis not present

## 2014-08-16 DIAGNOSIS — M79641 Pain in right hand: Secondary | ICD-10-CM | POA: Diagnosis not present

## 2014-08-16 DIAGNOSIS — M810 Age-related osteoporosis without current pathological fracture: Secondary | ICD-10-CM | POA: Diagnosis not present

## 2014-08-16 DIAGNOSIS — M19041 Primary osteoarthritis, right hand: Secondary | ICD-10-CM | POA: Diagnosis not present

## 2014-08-16 DIAGNOSIS — Z23 Encounter for immunization: Secondary | ICD-10-CM | POA: Diagnosis not present

## 2014-09-14 ENCOUNTER — Other Ambulatory Visit: Payer: Self-pay

## 2014-09-14 DIAGNOSIS — Z1231 Encounter for screening mammogram for malignant neoplasm of breast: Secondary | ICD-10-CM

## 2014-09-21 DIAGNOSIS — H3531 Nonexudative age-related macular degeneration: Secondary | ICD-10-CM | POA: Diagnosis not present

## 2014-09-26 ENCOUNTER — Encounter (INDEPENDENT_AMBULATORY_CARE_PROVIDER_SITE_OTHER): Payer: Self-pay

## 2014-09-26 ENCOUNTER — Other Ambulatory Visit: Payer: Self-pay

## 2014-09-26 ENCOUNTER — Ambulatory Visit
Admission: RE | Admit: 2014-09-26 | Discharge: 2014-09-26 | Disposition: A | Discharge status: Home / Self Care | Payer: Medicare Other | Source: Ambulatory Visit

## 2014-09-26 DIAGNOSIS — Z1231 Encounter for screening mammogram for malignant neoplasm of breast: Secondary | ICD-10-CM

## 2014-10-10 ENCOUNTER — Telehealth: Payer: Self-pay | Admitting: Oncology

## 2014-10-10 ENCOUNTER — Telehealth: Payer: Self-pay | Admitting: Hematology and Oncology

## 2014-10-10 NOTE — Telephone Encounter (Signed)
due to bmdc 3/23 appt moved to AM. left message for pt and also confirmed 3/16 appt. schedule mailed.

## 2014-10-10 NOTE — Telephone Encounter (Signed)
pt called back to re vm about appts for 3/16 and 3/23 and to confirm provider. pt is a [facility] pt not [facility]. confirmed 3/16 lab and pt to see GM 3/23 @ 1pm. pt has both dates/times and previous calendar pulled from mail. no information mailed to pt.

## 2014-10-29 ENCOUNTER — Other Ambulatory Visit: Payer: Self-pay | Admitting: *Deleted

## 2014-10-29 DIAGNOSIS — C50911 Malignant neoplasm of unspecified site of right female breast: Secondary | ICD-10-CM

## 2014-10-29 MED ORDER — ANASTROZOLE 1 MG PO TABS: 1.0000 mg | ORAL_TABLET | Freq: Every day | ORAL | Status: DC

## 2014-11-05 ENCOUNTER — Other Ambulatory Visit: Payer: Self-pay | Admitting: *Deleted

## 2014-11-05 NOTE — Telephone Encounter (Signed)
This RN spoke with pt per call stating she was dispensed anastrazole at pharmacy instead of tamoxifen- " I am taking tamoxifen and do not think I need this prescription"  Anastrazole on pt's med list and verified at last visit.  Per dictation for that date MD changed therapy to tamoxifen.  This RN informed pt of request per electronic system from her pharmacy-  This RN spoke with pt's pharmacy who states both medications are on pt's formulary. Pharmacy will accept medication and refund pt- arimidex/anastrazole removed from her formulary.  Discussed above with pt.

## 2014-11-20 ENCOUNTER — Other Ambulatory Visit: Payer: Self-pay | Admitting: *Deleted

## 2014-11-20 DIAGNOSIS — C50911 Malignant neoplasm of unspecified site of right female breast: Secondary | ICD-10-CM

## 2014-11-21 ENCOUNTER — Other Ambulatory Visit (HOSPITAL_BASED_OUTPATIENT_CLINIC_OR_DEPARTMENT_OTHER): Payer: Medicare Other

## 2014-11-21 DIAGNOSIS — C50811 Malignant neoplasm of overlapping sites of right female breast: Secondary | ICD-10-CM | POA: Diagnosis not present

## 2014-11-21 DIAGNOSIS — C50911 Malignant neoplasm of unspecified site of right female breast: Secondary | ICD-10-CM

## 2014-11-21 LAB — COMPREHENSIVE METABOLIC PANEL (CC13)
ALT: 11 U/L (ref 0–55)
AST: 16 U/L (ref 5–34)
Albumin: 3.7 g/dL (ref 3.5–5.0)
Alkaline Phosphatase: 94 U/L (ref 40–150)
Anion Gap: 8 mEq/L (ref 3–11)
BUN: 14.9 mg/dL (ref 7.0–26.0)
CO2: 26 mEq/L (ref 22–29)
Calcium: 8.4 mg/dL (ref 8.4–10.4)
Chloride: 108 mEq/L (ref 98–109)
Creatinine: 0.9 mg/dL (ref 0.6–1.1)
EGFR: 63 mL/min/{1.73_m2} — ABNORMAL LOW (ref >90)
Glucose: 92 mg/dl (ref 70–140)
Potassium: 4.2 mEq/L (ref 3.5–5.1)
Sodium: 143 mEq/L (ref 136–145)
Total Bilirubin: 0.29 mg/dL (ref 0.20–1.20)
Total Protein: 6.2 g/dL — ABNORMAL LOW (ref 6.4–8.3)

## 2014-11-21 LAB — CBC WITH DIFFERENTIAL/PLATELET
BASO%: 1 % (ref 0.0–2.0)
Basophils Absolute: 0.1 10*3/uL (ref 0.0–0.1)
EOS%: 2.1 % (ref 0.0–7.0)
Eosinophils Absolute: 0.1 10*3/uL (ref 0.0–0.5)
HCT: 42 % (ref 34.8–46.6)
HGB: 13.4 g/dL (ref 11.6–15.9)
LYMPH%: 31.1 % (ref 14.0–49.7)
MCH: 30.9 pg (ref 25.1–34.0)
MCHC: 32 g/dL (ref 31.5–36.0)
MCV: 96.5 fL (ref 79.5–101.0)
MONO#: 0.6 10*3/uL (ref 0.1–0.9)
MONO%: 10.5 % (ref 0.0–14.0)
NEUT#: 3.3 10*3/uL (ref 1.5–6.5)
NEUT%: 55.3 % (ref 38.4–76.8)
Platelets: 296 10*3/uL (ref 145–400)
RBC: 4.35 10*6/uL (ref 3.70–5.45)
RDW: 14.3 % (ref 11.2–14.5)
WBC: 5.9 10*3/uL (ref 3.9–10.3)
lymph#: 1.8 10*3/uL (ref 0.9–3.3)

## 2014-11-28 ENCOUNTER — Telehealth: Payer: Self-pay | Admitting: Oncology

## 2014-11-28 ENCOUNTER — Ambulatory Visit (HOSPITAL_BASED_OUTPATIENT_CLINIC_OR_DEPARTMENT_OTHER): Payer: Medicare Other | Admitting: Oncology

## 2014-11-28 VITALS — BP 140/79 | HR 82 | Temp 98.0°F | Resp 18 | Ht 63.5 in | Wt 138.5 lb

## 2014-11-28 DIAGNOSIS — M8580 Other specified disorders of bone density and structure, unspecified site: Secondary | ICD-10-CM | POA: Diagnosis not present

## 2014-11-28 DIAGNOSIS — Z170 Estrogen receptor positive status [ER+]: Secondary | ICD-10-CM | POA: Diagnosis not present

## 2014-11-28 DIAGNOSIS — C50811 Malignant neoplasm of overlapping sites of right female breast: Secondary | ICD-10-CM | POA: Diagnosis not present

## 2014-11-28 DIAGNOSIS — C50919 Malignant neoplasm of unspecified site of unspecified female breast: Secondary | ICD-10-CM

## 2014-11-28 NOTE — Progress Notes (Signed)
[place]  Telephone:([phone]) [phone] [phone]) [phone]     ID: DAJE STARK DOB: 1945/10/30  MR#: [id number]  KDX#:[id number]  Patient Care Team: Carlena Sax, MD as PCP - General (Internal Medicine) Chauncey Cruel, MD as Consulting Physician (Oncology) GYN: Elveria Rising M.D. SU: Star Age MD OTHER MD: Crissie Reese M.D.  CHIEF COMPLAINT: Estrogen receptor positive breast cancer  CURRENT TREATMENT: Tamoxifen BREAST CANCER HISTORY: From Dr. 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 This is in 2011.   #2 patient went on to receive neoadjuvant chemotherapy consisting of FEC 100 followed by 7 weeks of single agent Taxol.  #3 patient A mastectomy of the right breast with the final pathology revealing a T1 C. N0 disease.  #4 patient has had right breast reconstruction performed by Dr. Crissie Reese.  #5 she was initially on letrozole 2.5 mg daily since August 2012. She subsequently discontinued this and we started her on tamoxifen. She thereafter discontinued that as well and was started on Arimidex. She stopped this for a few days and then went back on starting on January 2013.  Her subsequent history is as detailed below  INTERVAL HISTORY: [name] returns today for follow-up of her breast cancer. After the holidays she started tamoxifen, on 09/07/2014. She has had no problems with that. She has perhaps an occasional hot flash. There has been no vaginal discharge. She has had no other problems due to tamoxifen that she is aware of.  REVIEW OF SYSTEMS:  [name] has some aches and pains here in there which are not more prominent or persistent than before. A detailed review of systems today was otherwise noncontributory.   PAST MEDICAL HISTORY: Past Medical History  Diagnosis Date  . Arthritis   . Hearing loss 2005    Right ear  .  Menopause   . Hormone replacement therapy (postmenopausal)   . Colon polyps 2001  . S/P breast augmentation 1987  . Herniated disc 2003    L5-SI  . Complex endometrial hyperplasia without atypia 2000  . Breast cancer     PAST SURGICAL HISTORY: Past Surgical History  Procedure Laterality Date  . Left index finger fusion      10/07; 3/08  . Breast surgery      bilateral breast augmentation  . Breast surgery  03/13/11    right mastectomy-sln,ERPR+,Her2-, Reconstruction  . Salpingectomy Right     Ectopic  . Tubal ligation    . Appendectomy  1983    LSO- Serous cystoadenoma    FAMILY HISTORY Family History  Problem Relation Age of Onset  . Breast cancer Mother 6  . Lupus Sister    the patient's father died from pancreatic cancer at the age of 69. The patient's mother is currently 15 years old, lives independently in [place]. She was diagnosed with breast cancer in her 70s. The patient had one brother, a twin sister and another sister. There is no other history of breast or ovarian cancer in the family.  GYNECOLOGIC HISTORY:  No LMP recorded. Patient is postmenopausal. Menarche age 16, the patient is GX P0. She took hormone replacement for approximately 20 years. She went through the change of life approximately age 69.  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 Her husband Clair Gulling sells real estate. They have no children and no pets and are not  church attender's.     ADVANCED DIRECTIVES:  living will in place   HEALTH MAINTENANCE: History  Substance Use Topics  . Smoking status: Current Every Day Smoker -- 0.50 packs/day    Types: Cigarettes  . Smokeless tobacco: Never Used  . Alcohol Use: 3.5 oz/week    7 drink(s) per week     Comment: 1 cocktail q morning     Colonoscopy:  PAP:  Bone density: 09/05/2013 showed a T score of -2.0  Lipid panel:  Allergies  Allergen Reactions  . Dilaudid [Hydromorphone Hcl] Nausea And Vomiting  and Other (See Comments)    Gastrointestinal distress  . Sulfa Antibiotics Nausea And Vomiting and Other (See Comments)    Gastrointestinal distress    Current Outpatient Prescriptions  Medication Sig Dispense Refill  . acetaminophen (TYLENOL) 500 MG tablet Take by mouth.    . Acetaminophen-Caffeine (EXCEDRIN TENSION HEADACHE PO) Take by mouth as needed.    [name] Bioflavonoid Products (PERIDIN-C PO) Take 2 tablets by mouth 2 (two) times daily.    . Calcium Carbonate (CALTRATE 600 PO) Take by mouth.    . Cholecalciferol (VITAMIN D-3) 1000 UNITS CAPS Take by mouth.    . diphenhydrAMINE (BENADRYL) 50 MG capsule Take by mouth.    . doxylamine, Sleep, (UNISOM) 25 MG tablet Take 25 mg by mouth at bedtime as needed.    . fish oil-omega-3 fatty acids 1000 MG capsule Take 2 g by mouth daily.    . Flaxseed, Linseed, (FLAX SEEDS PO) Take 1,000 mg by mouth 2 (two) times daily.    . Flaxseed, Linseed, (FLAXSEED OIL) 1000 MG CAPS Use 1 tablet 2 (two) times daily.    . fluticasone (FLONASE) 50 MCG/ACT nasal spray Place 2 sprays into the nose daily.    [name] FLUZONE HIGH-DOSE 0.5 ML SUSY   0  . gabapentin (NEURONTIN) 100 MG capsule Take 100 mg by mouth 2 (two) times daily.      . Glucosamine-Chondroitin (OSTEO BI-FLEX REGULAR STRENGTH PO) Take by mouth 2 (two) times daily.     [name] ibuprofen (ADVIL,MOTRIN) 800 MG tablet Take by mouth.    . IBUPROFEN PO Take by mouth as needed.    . nystatin-triamcinolone ointment (MYCOLOG) Apply topically 2 (two) times daily. 30 g 0  . Omega-3 Fatty Acids (FISH OIL) 1000 MG CAPS Take by mouth.    [name] PNEUMOVAX 23 25 MCG/0.5ML injection   0  . Probiotic Product (PROBIOTIC DAILY PO) Take by mouth.    . ranitidine (ZANTAC) 150 MG tablet Take by mouth as needed.     . saccharomyces boulardii (FLORASTOR) 250 MG capsule Take by mouth.    . tamoxifen (NOLVADEX) 20 MG tablet Take 20 mg by mouth daily.  1  . venlafaxine XR (EFFEXOR-XR) 75 MG 24 hr capsule TAKE 1 CAPSULE BY MOUTH DAILY 90  capsule 3  . Vitamin D, Ergocalciferol, (DRISDOL) 50000 UNITS CAPS TAKE 1 CAPSULE BY MOUTH EVERY OTHER WEEK 26 capsule 0  . ZOSTAVAX 40981 UNT/0.65ML injection   0   No current facility-administered medications for this visit.    OBJECTIVE: Middle-aged white woman who appears well  Filed Vitals:   11/28/14 1301  BP: 140/79  Pulse: 82  Temp: 98 F (36.7 C)  Resp: 18     Body mass index is 24.15 kg/(m^2).    ECOG FS:0 - Asymptomatic  Sclerae unicteric, pupils equal and reactive Oropharynx clear and moist No cervical or supraclavicular adenopathy Lungs no rales or rhonchi Heart regular  rate and rhythm Abd soft, nontender, positive bowel sounds MSK no focal spinal tenderness, no upper extremity lymphedema Neuro: nonfocal, well oriented, positive affect affect Breasts: The right breast is status post mastectomy with implant reconstruction. There is no evidence of chest wall recurrence. The right axilla is benign. The left breast is status post reduction mammoplasty. It is otherwise unremarkable.   LAB RESULTS:  CMP     Component Value Date/Time   NA 143 11/21/2014 1245   NA 142 10/19/2011 1106   K 4.2 11/21/2014 1245   K 4.3 10/19/2011 1106   CL 104 11/01/2012 1055   CL 106 10/19/2011 1106   CO2 26 11/21/2014 1245   CO2 29 10/19/2011 1106   GLUCOSE 92 11/21/2014 1245   GLUCOSE 93 11/01/2012 1055   GLUCOSE 96 10/19/2011 1106   BUN 14.9 11/21/2014 1245   BUN 23 10/19/2011 1106   CREATININE 0.9 11/21/2014 1245   CREATININE 0.82 10/19/2011 1106   CALCIUM 8.4 11/21/2014 1245   CALCIUM 9.7 10/19/2011 1106   PROT 6.2* 11/21/2014 1245   PROT 6.5 10/19/2011 1106   ALBUMIN 3.7 11/21/2014 1245   ALBUMIN 4.3 10/19/2011 1106   AST 16 11/21/2014 1245   AST 15 10/19/2011 1106   ALT 11 11/21/2014 1245   ALT 14 10/19/2011 1106   ALKPHOS 94 11/21/2014 1245   ALKPHOS 95 10/19/2011 1106   BILITOT 0.29 11/21/2014 1245   BILITOT 0.3 10/19/2011 1106   GFRNONAA >60 02/26/2011 1034    GFRAA >60 02/26/2011 1034    INo results found for: SPEP, UPEP  Lab Results  Component Value Date   WBC 5.9 11/21/2014   NEUTROABS 3.3 11/21/2014   HGB 13.4 11/21/2014   HCT 42.0 11/21/2014   MCV 96.5 11/21/2014   PLT 296 11/21/2014      Chemistry      Component Value Date/Time   NA 143 11/21/2014 1245   NA 142 10/19/2011 1106   K 4.2 11/21/2014 1245   K 4.3 10/19/2011 1106   CL 104 11/01/2012 1055   CL 106 10/19/2011 1106   CO2 26 11/21/2014 1245   CO2 29 10/19/2011 1106   BUN 14.9 11/21/2014 1245   BUN 23 10/19/2011 1106   CREATININE 0.9 11/21/2014 1245   CREATININE 0.82 10/19/2011 1106      Component Value Date/Time   CALCIUM 8.4 11/21/2014 1245   CALCIUM 9.7 10/19/2011 1106   ALKPHOS 94 11/21/2014 1245   ALKPHOS 95 10/19/2011 1106   AST 16 11/21/2014 1245   AST 15 10/19/2011 1106   ALT 11 11/21/2014 1245   ALT 14 10/19/2011 1106   BILITOT 0.29 11/21/2014 1245   BILITOT 0.3 10/19/2011 1106       Lab Results  Component Value Date   LABCA2 30 09/09/2010    No components found for: [id number]  No results for input(s): INR in the last 168 hours.  Urinalysis    Component Value Date/Time   BILIRUBINUR - 04/06/2014 1346   PROTEINUR - 04/06/2014 1346   UROBILINOGEN negative 04/06/2014 1346   NITRITE - 04/06/2014 1346   LEUKOCYTESUR moderate (2+) 04/06/2014 1346    STUDIES: CLINICAL DATA: Screening.  EXAM: DIGITAL SCREENING UNILATERAL LEFT MAMMOGRAM WITH IMPLANTS, TOMO AND CAD  The patient has retropectoral implants. Standard and implant displaced views were performed.  COMPARISON: Previous exam(s)  ACR Breast Density Category c: The breast tissue is heterogeneously dense, which may obscure small masses.  FINDINGS: The patient has had a right mastectomy. There are  no findings suspicious for malignancy. Images were processed with CAD.  IMPRESSION: No mammographic evidence of malignancy. A result letter of this screening mammogram  will be mailed directly to the patient.  RECOMMENDATION: Screening mammogram in one year. (Code:[id number]-B-01Y)  BI-RADS CATEGORY 1: Negative.   Electronically Signed  By: Lillia Mountain M.D.  On: 10/01/2014 11:44  ASSESSMENT: 69 y.o. [place] woman   (1) Status post right breast biopsy 08/18/2010(SAA [phone]) for a clinical T1c N0, stage IA invasive ductal carcinoma, grade 1, estrogen receptor 97% positive, progesterone receptor 98% positive, with an MIB-1 of 15% and no HER-2 amplification (ratio 1.60).  (2) treated neoadjuvantly with cyclophosphamide, fluorouracil, and epirubicin in dose dense fashion 4 cycles, completed 10/31/2010, followed (according to the 10/19/2011 oncology note) by 7 of 12 planned doses of weekly paclitaxel  (3) status post right mastectomy and sentinel lymph node sampling 03/03/2011, showing (SZA  [id number]) a pT1c pN0, stage IA invasive ductal carcinoma, grade 1, with ample margins  (4) Letrozole started August 2012, switched to tamoxifen then definitively on anastrozole as of January 2013, discontinued July 2015.  (a) resumed tamoxifen 09/07/2014  (5) osteopenia, formerly on ibandronate   PLAN: [name] is tolerating tamoxifen well, with no unusual or bothersome side effects, and the plan is going to be to continue that for 2 years, at which point she will "graduate".  She has started a walking program, which is very favorable. I will see her on a yearly basis until she completes her antiestrogen treatment.  [name] has a good understanding of the overall plan. She agrees with it. She knows the goal of treatment in her case is cure. She will call with any problems that may develop before her next visit here.  Chauncey Cruel, MD   11/28/2014 1:02 PM

## 2014-11-28 NOTE — Progress Notes (Signed)
PCP: ROLEN-GRUBB, PATRICIA, FNP GYN: SU:  OTHER MD:

## 2014-11-28 NOTE — Telephone Encounter (Signed)
Gave patient avs report and appointments for march 2017

## 2014-12-28 DIAGNOSIS — H903 Sensorineural hearing loss, bilateral: Secondary | ICD-10-CM | POA: Diagnosis not present

## 2015-01-10 DIAGNOSIS — H8143 Vertigo of central origin, bilateral: Secondary | ICD-10-CM | POA: Diagnosis not present

## 2015-01-10 DIAGNOSIS — H903 Sensorineural hearing loss, bilateral: Secondary | ICD-10-CM | POA: Diagnosis not present

## 2015-03-08 DIAGNOSIS — R87610 Atypical squamous cells of undetermined significance on cytologic smear of cervix (ASC-US): Secondary | ICD-10-CM

## 2015-03-08 HISTORY — DX: Atypical squamous cells of undetermined significance on cytologic smear of cervix (ASC-US): R87.610

## 2015-04-08 ENCOUNTER — Ambulatory Visit: Payer: Medicare Other | Admitting: Gynecology

## 2015-04-09 ENCOUNTER — Ambulatory Visit (INDEPENDENT_AMBULATORY_CARE_PROVIDER_SITE_OTHER): Payer: Medicare Other | Admitting: Nurse Practitioner

## 2015-04-09 ENCOUNTER — Encounter: Payer: Self-pay | Admitting: *Deleted

## 2015-04-09 VITALS — BP 134/78 | HR 76 | Ht 64.0 in | Wt 136.2 lb

## 2015-04-09 DIAGNOSIS — Z1211 Encounter for screening for malignant neoplasm of colon: Secondary | ICD-10-CM | POA: Diagnosis not present

## 2015-04-09 DIAGNOSIS — Z01419 Encounter for gynecological examination (general) (routine) without abnormal findings: Secondary | ICD-10-CM | POA: Diagnosis not present

## 2015-04-09 DIAGNOSIS — Z0000 Encounter for general adult medical examination without abnormal findings: Secondary | ICD-10-CM | POA: Diagnosis not present

## 2015-04-09 DIAGNOSIS — R87619 Unspecified abnormal cytological findings in specimens from cervix uteri: Secondary | ICD-10-CM | POA: Diagnosis not present

## 2015-04-09 DIAGNOSIS — E559 Vitamin D deficiency, unspecified: Secondary | ICD-10-CM | POA: Diagnosis not present

## 2015-04-09 DIAGNOSIS — C50911 Malignant neoplasm of unspecified site of right female breast: Secondary | ICD-10-CM

## 2015-04-09 DIAGNOSIS — E781 Pure hyperglyceridemia: Secondary | ICD-10-CM

## 2015-04-09 DIAGNOSIS — R635 Abnormal weight gain: Secondary | ICD-10-CM | POA: Diagnosis not present

## 2015-04-09 DIAGNOSIS — Z09 Encounter for follow-up examination after completed treatment for conditions other than malignant neoplasm: Secondary | ICD-10-CM | POA: Diagnosis not present

## 2015-04-09 LAB — POCT URINALYSIS DIPSTICK
Leukocytes, UA: NEGATIVE
Urobilinogen, UA: NEGATIVE
pH, UA: 5

## 2015-04-09 MED ORDER — VENLAFAXINE HCL ER 75 MG PO CP24: ORAL_CAPSULE | ORAL | Status: DC

## 2015-04-09 NOTE — Patient Instructions (Signed)

## 2015-04-09 NOTE — Progress Notes (Signed)
69 y.o. G1P0010 Married  Caucasian Fe here for annual exam.  She is having no new health problems.  Some increase in vaginal dryness over the years.  Patient's last menstrual period was 02/05/2002.          Sexually active: No.  The current method of family planning is tubal ligation.    Exercising: No.  The patient does not participate in regular exercise at present. Smoker:  Yes 1/2 pack/qd  Health Maintenance: Pap: 01/27/2010 wnl MMG:  10/01/14 bi-rads 1: negative Colonoscopy: 2001 & 2006 EDG: 12/29/2010 normal BMD:   09/05/2013 T Score: spine -2.0, left hip neck -1.0 (stable) TDaP:  04/06/2013  Shingles: 10/08/2014 Prevnar 13: 10/08/2014 Labs: Hgb: 14.1 ; Urine: Trace Protein   reports that she has been smoking Cigarettes.  She has been smoking about 0.50 packs per day. She has never used smokeless tobacco. She reports that she drinks about 4.2 oz of alcohol per week. She reports that she does not use illicit drugs.  Past Medical History  Diagnosis Date  . Arthritis   . Hearing loss 2005    Right ear, 60%  . Menopause   . Hormone replacement therapy (postmenopausal)     2001- DC 2012 with cancer diagnosis  . Colon polyps 2001  . S/P breast augmentation 1987  . Herniated disc 2003    L5-SI  . Complex endometrial hyperplasia without atypia 2000  . Breast cancer 2011    right invasive cancer  . Osteoporosis     Past Surgical History  Procedure Laterality Date  . Left index finger fusion      10/07; 3/08  . Breast surgery Bilateral 1987    bilateral breast augmentation  . Breast surgery  03/13/11    right mastectomy-sln,ERPR+,Her2-, Reconstruction  . Salpingectomy Right 1970's    Ectopic  . Tubal ligation    . Appendectomy  1983    LSO- Serous cystoadenoma    Current Outpatient Prescriptions  Medication Sig Dispense Refill  . Acetaminophen-Caffeine (EXCEDRIN TENSION HEADACHE PO) Take by mouth as needed.    [name] Bioflavonoid Products (PERIDIN-C PO) Take 2 tablets by mouth 2  (two) times daily.    . Calcium Carbonate-Vitamin D 600-400 MG-UNIT per chew tablet Chew 1 tablet by mouth daily.    . Cholecalciferol (VITAMIN D-3) 1000 UNITS CAPS Take by mouth.    . diphenhydrAMINE (BENADRYL) 50 MG capsule Take by mouth.    . doxylamine, Sleep, (UNISOM) 25 MG tablet Take 25 mg by mouth at bedtime as needed.    . Flaxseed, Linseed, (FLAX SEEDS PO) Take 1,000 mg by mouth 1 day or 1 dose.    . fluticasone (FLONASE) 50 MCG/ACT nasal spray Place 2 sprays into the nose daily.    [name] FLUZONE HIGH-DOSE 0.5 ML SUSY   0  . gabapentin (NEURONTIN) 100 MG capsule Take 100 mg by mouth 2 (two) times daily.      [name] ibuprofen (ADVIL,MOTRIN) 800 MG tablet Take by mouth.    . Misc Natural Products (OSTEO BI-FLEX JOINT SHIELD PO) Take by mouth.    . ranitidine (ZANTAC) 150 MG tablet Take by mouth as needed.     . tamoxifen (NOLVADEX) 20 MG tablet Take 20 mg by mouth daily.  1  . venlafaxine XR (EFFEXOR-XR) 75 MG 24 hr capsule TAKE 1 CAPSULE BY MOUTH DAILY 90 capsule 3  . Vitamin D, Ergocalciferol, (DRISDOL) 50000 UNITS CAPS TAKE 1 CAPSULE BY MOUTH EVERY OTHER WEEK 26 capsule 0   No  current facility-administered medications for this visit.    Family History  Problem Relation Age of Onset  . Breast cancer Mother 1  . Lupus Sister     pt's twin  . Pancreatic cancer Father   . Cancer - Other Brother     oral cancer- resolved  . Heart failure Maternal Grandmother   . Heart failure Maternal Grandfather   . Diabetes Paternal Grandmother   . Heart failure Paternal Grandfather   . Rheum arthritis Sister     ROS:  Pertinent items are noted in HPI.  Otherwise, a comprehensive ROS was negative.  Exam:   BP 134/78 mmHg  Pulse 76  Ht _0  (1.626 m)  Wt 136 lb 3.2 oz (61.78 kg)  BMI 23.37 kg/m2  LMP 02/05/2002 Height: _1  (162.6 cm) Ht Readings from Last 3 Encounters:  04/09/15 _2  (1.626 m)  11/28/14 5' 3.5" (1.613 m)  08/01/14 5' 3.5" (1.613 m)    General appearance: alert,  cooperative and appears stated age Head: Normocephalic, without obvious abnormality, atraumatic Neck: no adenopathy, supple, symmetrical, trachea midline and thyroid normal to inspection and palpation Lungs: clear to auscultation bilaterally Breasts: breast surgical changes bilaterally with augmentation  Heart: regular rate and rhythm Abdomen: soft, non-tender; no masses,  no organomegaly Extremities: extremities normal, atraumatic, no cyanosis or edema Skin: Skin color, texture, turgor normal. No rashes or lesions Lymph nodes: Cervical, supraclavicular, and axillary nodes normal. No abnormal inguinal nodes palpated Neurologic: Grossly normal   Pelvic: External genitalia:  no lesions              Urethra:  normal appearing urethra with no masses, tenderness or lesions              Bartholin's and Skene's: normal                 Vagina: normal appearing vagina with normal color and discharge, no lesions              Cervix: anteverted              Pap taken: Yes.   Bimanual Exam:  Uterus:  normal size, contour, position, consistency, mobility, non-tender              Adnexa: no mass, fullness, tenderness               Rectovaginal: Confirms               Anus:  normal sphincter tone, no lesions  Chaperone present: Yes  A:  Well Woman with normal exam  Postmenopausal - HRT 2001 - 2011  S/P Right invasive ductal breast cancer stage I -  Mastectomy 08/2010 treated with chemotherapy.  ER/PR +, Her 2 neg.  S/P Breast reconstruction 2012  Osteopenia - stopped Boniva secondary to SE  Atrophic vaginitis   P:   Reviewed health and wellness pertinent to exam  Pap smear as above  Mammogram is due 09/27/15  Follow with pap and labs  Refill on Effexor XR 75 mg daily for a year  Discussed olive oil or coconut oil prn for vaginal dryness  Counseled on breast self exam, mammography screening, adequate intake of calcium and vitamin D, diet and exercise, Kegel's exercises return annually or  prn  An After Visit Summary was printed and given to the patient.

## 2015-04-10 LAB — LIPID PANEL
Cholesterol: 173 mg/dL (ref 125–200)
HDL: 68 mg/dL (ref >=46)
LDL Cholesterol: 91 mg/dL (ref <130)
Total CHOL/HDL Ratio: 2.5 Ratio (ref <=5.0)
Triglycerides: 69 mg/dL (ref <150)
VLDL: 14 mg/dL (ref <30)

## 2015-04-10 LAB — TSH: TSH: 3.32 u[IU]/mL (ref 0.350–4.500)

## 2015-04-10 LAB — VITAMIN D 25 HYDROXY (VIT D DEFICIENCY, FRACTURES): Vit D, 25-Hydroxy: 33 ng/mL (ref 30–100)

## 2015-04-10 LAB — HEMOGLOBIN, FINGERSTICK: Hemoglobin, fingerstick: 14.1 g/dL (ref 12.0–16.0)

## 2015-04-11 LAB — IPS PAP SMEAR ONLY

## 2015-04-12 DIAGNOSIS — R87619 Unspecified abnormal cytological findings in specimens from cervix uteri: Secondary | ICD-10-CM | POA: Diagnosis not present

## 2015-04-13 LAB — IPS HPV ON A LIQUID BASED SPECIMEN

## 2015-04-13 NOTE — Progress Notes (Signed)
Encounter reviewed by Dr. Darel Ricketts Amundson C. Silva.  

## 2015-04-15 ENCOUNTER — Other Ambulatory Visit: Payer: Self-pay | Admitting: Nurse Practitioner

## 2015-04-15 LAB — FECAL OCCULT BLOOD, IMMUNOCHEMICAL: IFOBT: NEGATIVE

## 2015-04-15 MED ORDER — METRONIDAZOLE 0.75 % VA GEL: 1.0000 | Freq: Every day | VAGINAL | Status: DC

## 2015-04-15 NOTE — Addendum Note (Signed)
Addended by: Elroy Channel on: 04/15/2015 10:08 AM   Modules accepted: Orders

## 2015-04-16 ENCOUNTER — Telehealth: Payer: Self-pay

## 2015-04-16 NOTE — Telephone Encounter (Addendum)
Notes Recorded by Kem Boroughs, FNP on 04/15/2015 at 11:41 AM Please let pt. Know that IFOB is negative. Notes Recorded by Kem Boroughs, FNP on 04/15/2015 at 8:05 AM Please let pt. Know that pap was slightly abnormal but HR HPV test was negative. She has a BV infection and medication is sent in for her to the pharmacy. This may have been the cause of abnormal pap but we should still repeat pap next year. Notes Recorded by Kem Boroughs, FNP on 04/10/2015 at 8:33 AM Please let pt. Know that Vit D is OK at 33, but prefer that she increase Vit D from 1000 IU daily to 2000 IU daily. This will help to get her range at about 50. The TSH and Lipid panel is normal.

## 2015-04-16 NOTE — Telephone Encounter (Signed)
Left message to call Jumaane Weatherford at [phone]. 

## 2015-04-16 NOTE — Telephone Encounter (Signed)
Spoke with patient. Advised of all results as seen below from [place] Cage, [place]. Patient is agreeable and verbalizes understanding. She will increase her Vitamin D at this time. 02 recall entered.  Routing to provider for final review. Patient agreeable to disposition. Will close encounter.   Patient aware provider will review message and nurse will return call if any additional advice or change of disposition.

## 2015-04-17 DIAGNOSIS — H35363 Drusen (degenerative) of macula, bilateral: Secondary | ICD-10-CM | POA: Diagnosis not present

## 2015-04-17 DIAGNOSIS — H43813 Vitreous degeneration, bilateral: Secondary | ICD-10-CM | POA: Diagnosis not present

## 2015-04-17 DIAGNOSIS — H3531 Nonexudative age-related macular degeneration: Secondary | ICD-10-CM | POA: Diagnosis not present

## 2015-04-18 DIAGNOSIS — M8580 Other specified disorders of bone density and structure, unspecified site: Secondary | ICD-10-CM | POA: Diagnosis not present

## 2015-04-18 DIAGNOSIS — M170 Bilateral primary osteoarthritis of knee: Secondary | ICD-10-CM | POA: Diagnosis not present

## 2015-04-18 DIAGNOSIS — M19041 Primary osteoarthritis, right hand: Secondary | ICD-10-CM | POA: Diagnosis not present

## 2015-04-18 DIAGNOSIS — M5137 Other intervertebral disc degeneration, lumbosacral region: Secondary | ICD-10-CM | POA: Diagnosis not present

## 2015-06-13 DIAGNOSIS — H9313 Tinnitus, bilateral: Secondary | ICD-10-CM | POA: Diagnosis not present

## 2015-06-13 DIAGNOSIS — H6122 Impacted cerumen, left ear: Secondary | ICD-10-CM | POA: Diagnosis not present

## 2015-06-13 DIAGNOSIS — H903 Sensorineural hearing loss, bilateral: Secondary | ICD-10-CM | POA: Diagnosis not present

## 2015-07-03 DIAGNOSIS — Z23 Encounter for immunization: Secondary | ICD-10-CM | POA: Diagnosis not present

## 2015-09-04 ENCOUNTER — Other Ambulatory Visit: Payer: Self-pay | Admitting: *Deleted

## 2015-09-04 MED ORDER — TAMOXIFEN CITRATE 20 MG PO TABS: 20.0000 mg | ORAL_TABLET | Freq: Every day | ORAL | Status: DC

## 2015-09-30 ENCOUNTER — Telehealth: Payer: Self-pay | Admitting: Nurse Practitioner

## 2015-09-30 DIAGNOSIS — Z1231 Encounter for screening mammogram for malignant neoplasm of breast: Secondary | ICD-10-CM

## 2015-09-30 DIAGNOSIS — M810 Age-related osteoporosis without current pathological fracture: Secondary | ICD-10-CM

## 2015-09-30 NOTE — Telephone Encounter (Signed)
Spoke with patient. Advised order for BMD and 3D screening mammogram have been sent to the [facility]. Order for mammogram placed as MM Screening breast w/implant tomo uni left as the patient has had a right breast mastectomy. She may call to schedule at her convenience. Patient will call to schedule.  Routing to provider for final review. Patient agreeable to disposition. Will close encounter.

## 2015-09-30 NOTE — Telephone Encounter (Signed)
Patient called and requested orders for a bone density test and a 3-D mammogram be sent to [facility] so she can schedule.

## 2015-10-03 DIAGNOSIS — D485 Neoplasm of uncertain behavior of skin: Secondary | ICD-10-CM | POA: Diagnosis not present

## 2015-10-03 DIAGNOSIS — L570 Actinic keratosis: Secondary | ICD-10-CM | POA: Diagnosis not present

## 2015-10-03 DIAGNOSIS — L308 Other specified dermatitis: Secondary | ICD-10-CM | POA: Diagnosis not present

## 2015-10-17 DIAGNOSIS — H353131 Nonexudative age-related macular degeneration, bilateral, early dry stage: Secondary | ICD-10-CM | POA: Diagnosis not present

## 2015-10-21 DIAGNOSIS — M19241 Secondary osteoarthritis, right hand: Secondary | ICD-10-CM | POA: Diagnosis not present

## 2015-10-21 DIAGNOSIS — M19271 Secondary osteoarthritis, right ankle and foot: Secondary | ICD-10-CM | POA: Diagnosis not present

## 2015-10-21 DIAGNOSIS — M170 Bilateral primary osteoarthritis of knee: Secondary | ICD-10-CM | POA: Diagnosis not present

## 2015-10-21 DIAGNOSIS — M79641 Pain in right hand: Secondary | ICD-10-CM | POA: Diagnosis not present

## 2015-10-24 ENCOUNTER — Ambulatory Visit
Admission: RE | Admit: 2015-10-24 | Discharge: 2015-10-24 | Disposition: A | Discharge status: Home / Self Care | Payer: Medicare Other | Source: Ambulatory Visit | Attending: Nurse Practitioner | Admitting: Nurse Practitioner

## 2015-10-24 DIAGNOSIS — M810 Age-related osteoporosis without current pathological fracture: Secondary | ICD-10-CM

## 2015-10-24 DIAGNOSIS — Z1231 Encounter for screening mammogram for malignant neoplasm of breast: Secondary | ICD-10-CM

## 2015-10-24 DIAGNOSIS — M8588 Other specified disorders of bone density and structure, other site: Secondary | ICD-10-CM | POA: Diagnosis not present

## 2015-10-30 ENCOUNTER — Telehealth: Payer: Self-pay

## 2015-10-30 NOTE — Telephone Encounter (Signed)
-----   Message from [place], [place] sent at 10/30/2015 10:53 AM EST ----- LMTCB Routed to [facility]

## 2015-10-30 NOTE — Telephone Encounter (Signed)
Patient notified of results. See lab 

## 2015-10-30 NOTE — Telephone Encounter (Signed)
lmtcb

## 2015-11-21 ENCOUNTER — Other Ambulatory Visit (HOSPITAL_BASED_OUTPATIENT_CLINIC_OR_DEPARTMENT_OTHER): Payer: Medicare Other

## 2015-11-21 DIAGNOSIS — C50811 Malignant neoplasm of overlapping sites of right female breast: Secondary | ICD-10-CM | POA: Diagnosis not present

## 2015-11-21 DIAGNOSIS — C50919 Malignant neoplasm of unspecified site of unspecified female breast: Secondary | ICD-10-CM

## 2015-11-21 LAB — COMPREHENSIVE METABOLIC PANEL
ALT: 9 U/L (ref 0–55)
AST: 14 U/L (ref 5–34)
Albumin: 3.9 g/dL (ref 3.5–5.0)
Alkaline Phosphatase: 81 U/L (ref 40–150)
Anion Gap: 7 mEq/L (ref 3–11)
BUN: 23 mg/dL (ref 7.0–26.0)
CO2: 28 mEq/L (ref 22–29)
Calcium: 9.1 mg/dL (ref 8.4–10.4)
Chloride: 108 mEq/L (ref 98–109)
Creatinine: 1 mg/dL (ref 0.6–1.1)
EGFR: 56 mL/min/{1.73_m2} — ABNORMAL LOW (ref >90)
Glucose: 86 mg/dl (ref 70–140)
Potassium: 4.4 mEq/L (ref 3.5–5.1)
Sodium: 143 mEq/L (ref 136–145)
Total Bilirubin: 0.42 mg/dL (ref 0.20–1.20)
Total Protein: 6.8 g/dL (ref 6.4–8.3)

## 2015-11-21 LAB — CBC WITH DIFFERENTIAL/PLATELET
BASO%: 1 % (ref 0.0–2.0)
Basophils Absolute: 0.1 10*3/uL (ref 0.0–0.1)
EOS%: 2.9 % (ref 0.0–7.0)
Eosinophils Absolute: 0.2 10*3/uL (ref 0.0–0.5)
HCT: 45.8 % (ref 34.8–46.6)
HGB: 14.9 g/dL (ref 11.6–15.9)
LYMPH%: 33.5 % (ref 14.0–49.7)
MCH: 31.2 pg (ref 25.1–34.0)
MCHC: 32.5 g/dL (ref 31.5–36.0)
MCV: 95.8 fL (ref 79.5–101.0)
MONO#: 0.8 10*3/uL (ref 0.1–0.9)
MONO%: 11.4 % (ref 0.0–14.0)
NEUT#: 3.5 10*3/uL (ref 1.5–6.5)
NEUT%: 51.2 % (ref 38.4–76.8)
Platelets: 308 10*3/uL (ref 145–400)
RBC: 4.78 10*6/uL (ref 3.70–5.45)
RDW: 14.6 % — ABNORMAL HIGH (ref 11.2–14.5)
WBC: 6.9 10*3/uL (ref 3.9–10.3)
lymph#: 2.3 10*3/uL (ref 0.9–3.3)

## 2015-11-27 DIAGNOSIS — H6122 Impacted cerumen, left ear: Secondary | ICD-10-CM | POA: Diagnosis not present

## 2015-11-28 ENCOUNTER — Ambulatory Visit (HOSPITAL_BASED_OUTPATIENT_CLINIC_OR_DEPARTMENT_OTHER): Payer: Medicare Other | Admitting: Oncology

## 2015-11-28 ENCOUNTER — Telehealth: Payer: Self-pay | Admitting: Oncology

## 2015-11-28 VITALS — BP 125/77 | HR 89 | Temp 98.0°F | Resp 18 | Ht 64.0 in | Wt 133.1 lb

## 2015-11-28 DIAGNOSIS — M8580 Other specified disorders of bone density and structure, unspecified site: Secondary | ICD-10-CM | POA: Diagnosis not present

## 2015-11-28 DIAGNOSIS — Z79810 Long term (current) use of selective estrogen receptor modulators (SERMs): Secondary | ICD-10-CM | POA: Diagnosis not present

## 2015-11-28 DIAGNOSIS — Z170 Estrogen receptor positive status [ER+]: Secondary | ICD-10-CM | POA: Diagnosis not present

## 2015-11-28 DIAGNOSIS — C50411 Malignant neoplasm of upper-outer quadrant of right female breast: Secondary | ICD-10-CM

## 2015-11-28 DIAGNOSIS — C50911 Malignant neoplasm of unspecified site of right female breast: Secondary | ICD-10-CM | POA: Diagnosis not present

## 2015-11-28 NOTE — Telephone Encounter (Signed)
appt made and avs printed °

## 2015-11-28 NOTE — Progress Notes (Signed)
[place]  Telephone:([phone]) [phone] Fax:(336) [phone]     ID: MEARA WIECHMAN DOB: [birth year]  MR#: [id number]  JYN#:[id number]  Patient Care Team: Kem Boroughs, FNP as PCP - General (Nurse Practitioner) Chauncey Cruel, MD as Consulting Physician (Oncology) GYN: Elveria Rising M.D. SU: Star Age MD OTHER MD: Crissie Reese M.D.  CHIEF COMPLAINT: Estrogen receptor positive breast cancer  CURRENT TREATMENT: Tamoxifen  BREAST CANCER HISTORY: From Dr. 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 This is in 2011.   #2 patient went on to receive neoadjuvant chemotherapy consisting of FEC 100 followed by 7 weeks of single agent Taxol.  #3 patient A mastectomy of the right breast with the final pathology revealing a T1 C. N0 disease.  #4 patient has had right breast reconstruction performed by Dr. Crissie Reese.  #5 she was initially on letrozole 2.5 mg daily since August 2012. She subsequently discontinued this and we started her on tamoxifen. She thereafter discontinued that as well and was started on Arimidex. She stopped this for a few days and then went back on starting on January 2013.  Her subsequent history is as detailed below  INTERVAL HISTORY: Bethena Roys returns today for follow-up of her breast cancer. She continues on tamoxifen, which she is tolerating well. She has occasional hot flashes which are not a major issue. She doesn't suffer from vaginal discharge or wetness. She obtains athedrug at a very good price.  Together with her mammogram in February she had a bone density which shows osteopenia, but improved from prior  REVIEW OF SYSTEMS: Bethena Roys has developed what she describes as dry hands. She has seen to Jones in dermatology and he has tried various creams and ointments which so far have not resolved the problem. She tells me he  thinks this is a form of eczema. In terms of exercise she does mostly yardwork. She is not taking walks or doing yoga right now. She doesn't some arthritis symptoms here and there but these are not more persistent or intense than prior. A detailed review of systems today was otherwise noncontributory  PAST MEDICAL HISTORY: Past Medical History  Diagnosis Date  . Arthritis   . Hearing loss 2005    Right ear, 60%  . Menopause   . Hormone replacement therapy (postmenopausal)     2001- DC 2012 with cancer diagnosis  . Colon polyps 2001  . S/P breast augmentation 1987  . Herniated disc 2003    L5-SI  . Complex endometrial hyperplasia without atypia 2000  . Breast cancer 2011    right invasive cancer  . Osteoporosis     PAST SURGICAL HISTORY: Past Surgical History  Procedure Laterality Date  . Left index finger fusion      10/07; 3/08  . Breast surgery Bilateral 1987    bilateral breast augmentation  . Breast surgery  03/13/11    right mastectomy-sln,ERPR+,Her2-, Reconstruction  . Salpingectomy Right 1970's    Ectopic  . Tubal ligation    . Appendectomy  1983    LSO- Serous cystoadenoma    FAMILY HISTORY Family History  Problem Relation Age of Onset  . Breast cancer Mother 69  . Lupus Sister     pt's twin  . Pancreatic cancer Father   . Cancer - Other Brother     oral cancer- resolved  . Heart failure Maternal Grandmother   .  Heart failure Maternal Grandfather   . Diabetes Paternal Grandmother   . Heart failure Paternal Grandfather   . Rheum arthritis Sister    the patient's father died from pancreatic cancer at the age of 69. The patient's mother is currently 66 years old, lives independently in [place]. She was diagnosed with breast cancer in her 39s. The patient had one brother, a twin sister and another sister. There is no other history of breast or ovarian cancer in the family.  GYNECOLOGIC HISTORY:  Patient's last menstrual period was 02/05/2002. Menarche age  21, the patient is GX P0. She took hormone replacement for approximately 20 years. She went through the change of life approximately age 65.  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 Her husband Clair Gulling sells real estate. They have no children and no pets and are not church attender's.     ADVANCED DIRECTIVES:  living will in place   HEALTH MAINTENANCE: Social History  Substance Use Topics  . Smoking status: Current Every Day Smoker -- 0.50 packs/day    Types: Cigarettes  . Smokeless tobacco: Never Used  . Alcohol Use: 4.2 oz/week    7 Standard drinks or equivalent per week     Comment: 1 cocktail q morning     Colonoscopy:  PAP:  Bone density: 09/05/2013 showed a T score of -2.0  Lipid panel:  Allergies  Allergen Reactions  . Dilaudid [Hydromorphone Hcl] Nausea And Vomiting and Other (See Comments)    Gastrointestinal distress  . Sulfa Antibiotics Nausea And Vomiting and Other (See Comments)    Gastrointestinal distress    Current Outpatient Prescriptions  Medication Sig Dispense Refill  . Acetaminophen-Caffeine (EXCEDRIN TENSION HEADACHE PO) Take by mouth as needed.    [name] Bioflavonoid Products (PERIDIN-C PO) Take 2 tablets by mouth 2 (two) times daily.    . Calcium Carbonate-Vitamin D 600-400 MG-UNIT per chew tablet Chew 1 tablet by mouth daily.    . Cholecalciferol (VITAMIN D-3) 1000 UNITS CAPS Take by mouth.    . diphenhydrAMINE (BENADRYL) 50 MG capsule Take by mouth.    . doxylamine, Sleep, (UNISOM) 25 MG tablet Take 25 mg by mouth at bedtime as needed.    . Flaxseed, Linseed, (FLAX SEEDS PO) Take 1,000 mg by mouth 1 day or 1 dose.    . fluticasone (FLONASE) 50 MCG/ACT nasal spray Place 2 sprays into the nose daily.    [name] FLUZONE HIGH-DOSE 0.5 ML SUSY   0  . gabapentin (NEURONTIN) 100 MG capsule Take 100 mg by mouth 2 (two) times daily.      [name] ibuprofen (ADVIL,MOTRIN) 800 MG tablet Take by mouth.    . metroNIDAZOLE (METROGEL) 0.75 %  vaginal gel Place 1 Applicatorful vaginally at bedtime. 70 g 0  . Misc Natural Products (OSTEO BI-FLEX JOINT SHIELD PO) Take by mouth.    . ranitidine (ZANTAC) 150 MG tablet Take by mouth as needed.     . tamoxifen (NOLVADEX) 20 MG tablet Take 1 tablet (20 mg total) by mouth daily. 30 tablet 3  . venlafaxine XR (EFFEXOR-XR) 75 MG 24 hr capsule TAKE 1 CAPSULE BY MOUTH DAILY 90 capsule 3  . Vitamin D, Ergocalciferol, (DRISDOL) 50000 UNITS CAPS TAKE 1 CAPSULE BY MOUTH EVERY OTHER WEEK 26 capsule 0   No current facility-administered medications for this visit.    OBJECTIVE: Middle-aged white woman In no acute distress Filed Vitals:   11/28/15 1329  BP: 125/77  Pulse: 89  Temp: 98 F (36.7 C)  Resp: 18     Body mass index is 22.84 kg/(m^2).    ECOG FS:1 - Symptomatic but completely ambulatory  Sclerae unicteric, EOMs intact Oropharynx clear, dentition in good repair No cervical or supraclavicular adenopathy Lungs no rales or rhonchi Heart regular rate and rhythm Abd soft, nontender, positive bowel sounds MSK no focal spinal tenderness, no upper extremity lymphedema Neuro: nonfocal, well oriented, appropriate affect Breasts: The right breast is status post mastectomy with implant reconstruction. There is no evidence of local recurrence, although there may be capsule formation. The right axilla is benign. The left breast is status post reduction mammoplasty.   LAB RESULTS:  CMP     Component Value Date/Time   NA 143 11/21/2015 1346   NA 142 10/19/2011 1106   K 4.4 11/21/2015 1346   K 4.3 10/19/2011 1106   CL 104 11/01/2012 1055   CL 106 10/19/2011 1106   CO2 28 11/21/2015 1346   CO2 29 10/19/2011 1106   GLUCOSE 86 11/21/2015 1346   GLUCOSE 93 11/01/2012 1055   GLUCOSE 96 10/19/2011 1106   BUN 23.0 11/21/2015 1346   BUN 23 10/19/2011 1106   CREATININE 1.0 11/21/2015 1346   CREATININE 0.82 10/19/2011 1106   CALCIUM 9.1 11/21/2015 1346   CALCIUM 9.7 10/19/2011 1106   PROT  6.8 11/21/2015 1346   PROT 6.5 10/19/2011 1106   ALBUMIN 3.9 11/21/2015 1346   ALBUMIN 4.3 10/19/2011 1106   AST 14 11/21/2015 1346   AST 15 10/19/2011 1106   ALT 9 11/21/2015 1346   ALT 14 10/19/2011 1106   ALKPHOS 81 11/21/2015 1346   ALKPHOS 95 10/19/2011 1106   BILITOT 0.42 11/21/2015 1346   BILITOT 0.3 10/19/2011 1106   GFRNONAA >60 02/26/2011 1034   GFRAA >60 02/26/2011 1034    INo results found for: SPEP, UPEP  Lab Results  Component Value Date   WBC 6.9 11/21/2015   NEUTROABS 3.5 11/21/2015   HGB 14.9 11/21/2015   HCT 45.8 11/21/2015   MCV 95.8 11/21/2015   PLT 308 11/21/2015      Chemistry      Component Value Date/Time   NA 143 11/21/2015 1346   NA 142 10/19/2011 1106   K 4.4 11/21/2015 1346   K 4.3 10/19/2011 1106   CL 104 11/01/2012 1055   CL 106 10/19/2011 1106   CO2 28 11/21/2015 1346   CO2 29 10/19/2011 1106   BUN 23.0 11/21/2015 1346   BUN 23 10/19/2011 1106   CREATININE 1.0 11/21/2015 1346   CREATININE 0.82 10/19/2011 1106      Component Value Date/Time   CALCIUM 9.1 11/21/2015 1346   CALCIUM 9.7 10/19/2011 1106   ALKPHOS 81 11/21/2015 1346   ALKPHOS 95 10/19/2011 1106   AST 14 11/21/2015 1346   AST 15 10/19/2011 1106   ALT 9 11/21/2015 1346   ALT 14 10/19/2011 1106   BILITOT 0.42 11/21/2015 1346   BILITOT 0.3 10/19/2011 1106       Lab Results  Component Value Date   LABCA2 30 09/09/2010    No components found for: [id number]  No results for input(s): INR in the last 168 hours.  Urinalysis    Component Value Date/Time   BILIRUBINUR - 04/09/2015 1442   PROTEINUR Trace 04/09/2015 1442   UROBILINOGEN negative 04/09/2015 1442   NITRITE - 04/09/2015 1442   LEUKOCYTESUR Negative 04/09/2015 1442    STUDIES: CLINICAL DATA: Screening.  EXAM: DIGITAL SCREENING UNILATERAL LEFT MAMMOGRAM  WITH IMPLANTS, TOMO AND CAD  The patient has retropectoral implant. Standard and implant displaced views were performed.  COMPARISON:  Previous exam(s).  ACR Breast Density Category c: The breast tissue is heterogeneously dense, which may obscure small masses.  FINDINGS: There are no findings suspicious for malignancy. Images were processed with CAD.  IMPRESSION: No mammographic evidence of malignancy. A result letter of this screening mammogram will be mailed directly to the patient.  RECOMMENDATION: Screening mammogram in one year. (Code:[id number]-B-01Y)  BI-RADS CATEGORY 1: Negative.   Electronically Signed  By: Lovey Newcomer M.D.  On: 10/28/2015 11:38   ASSESSMENT: 70 y.o. [place] woman   (1) Status post right breast biopsy 08/18/2010(SAA [phone]) for a clinical T1c N0, stage IA invasive ductal carcinoma, grade 1, estrogen receptor 97% positive, progesterone receptor 98% positive, with an MIB-1 of 15% and no HER-2 amplification (ratio 1.60).  (2) treated neoadjuvantly with cyclophosphamide, fluorouracil, and epirubicin in dose dense fashion 4 cycles, completed 10/31/2010, followed (according to the 10/19/2011 oncology note) by 7 of 12 planned doses of weekly paclitaxel  (3) status post right mastectomy and sentinel lymph node sampling 03/03/2011, showing (SZA  [id number]) a pT1c pN0, stage IA invasive ductal carcinoma, grade 1, with ample margins  (4) Letrozole started August 2012, switched to tamoxifen then definitively on anastrozole as of January 2013, discontinued July 2015.  (a) resumed tamoxifen 09/07/2014  (5) osteopenia, formerly on ibandronate   PLAN: Bethena Roys is now 5 years out from her definitive surgery and 4 years into her 5 year plan of anti-estrogens. There is no evidence of disease recurrence. This is very favorable.  The overall plan is to continue tamoxifen one more year.  The improved bone density is very gratifying. I think she would do even better if she walked regularly and today I gave her information on the Trail to recovery program.  I don't know why her hand dryness problem  has not improved. It does not look like eczema to me but then I am not a dermatologist. I suggest that she changed soaps and specifically switch to a super fat itself with no HER-2 minute. If there is no improvement after 2 months then she could consider a second dermatology opinion if she wishes. I gave her some suggestions in that regard.  Otherwise she will see me again in a year. She will likely "graduate" at that visit. She knows to call for any problems that may develop before that.  Chauncey Cruel, MD   11/28/2015 1:36 PM

## 2016-01-09 ENCOUNTER — Other Ambulatory Visit: Payer: Self-pay | Admitting: *Deleted

## 2016-01-09 MED ORDER — TAMOXIFEN CITRATE 20 MG PO TABS: 20.0000 mg | ORAL_TABLET | Freq: Every day | ORAL | Status: DC

## 2016-01-16 DIAGNOSIS — L308 Other specified dermatitis: Secondary | ICD-10-CM | POA: Diagnosis not present

## 2016-04-02 DIAGNOSIS — H35363 Drusen (degenerative) of macula, bilateral: Secondary | ICD-10-CM | POA: Diagnosis not present

## 2016-04-02 DIAGNOSIS — H2513 Age-related nuclear cataract, bilateral: Secondary | ICD-10-CM | POA: Diagnosis not present

## 2016-04-13 ENCOUNTER — Encounter: Payer: Self-pay | Admitting: Nurse Practitioner

## 2016-04-13 ENCOUNTER — Ambulatory Visit (INDEPENDENT_AMBULATORY_CARE_PROVIDER_SITE_OTHER): Payer: Medicare Other | Admitting: Nurse Practitioner

## 2016-04-13 VITALS — BP 112/68 | HR 60 | Ht 63.0 in | Wt 129.0 lb

## 2016-04-13 DIAGNOSIS — Z0000 Encounter for general adult medical examination without abnormal findings: Secondary | ICD-10-CM | POA: Diagnosis not present

## 2016-04-13 DIAGNOSIS — R87610 Atypical squamous cells of undetermined significance on cytologic smear of cervix (ASC-US): Secondary | ICD-10-CM | POA: Diagnosis not present

## 2016-04-13 DIAGNOSIS — R87619 Unspecified abnormal cytological findings in specimens from cervix uteri: Secondary | ICD-10-CM | POA: Diagnosis not present

## 2016-04-13 DIAGNOSIS — Z01419 Encounter for gynecological examination (general) (routine) without abnormal findings: Secondary | ICD-10-CM | POA: Diagnosis not present

## 2016-04-13 DIAGNOSIS — Z124 Encounter for screening for malignant neoplasm of cervix: Secondary | ICD-10-CM | POA: Diagnosis not present

## 2016-04-13 DIAGNOSIS — Z1159 Encounter for screening for other viral diseases: Secondary | ICD-10-CM

## 2016-04-13 NOTE — Progress Notes (Signed)
Reviewed personally.  M. Suzanne Quentina Fronek, MD.  

## 2016-04-13 NOTE — Progress Notes (Deleted)
70 y.o. G53P0010 Married  Caucasian Fe here for annual exam.  No new diagnosis.  Still on Tamoxifen.  Bilateral hand peeling that seems to present X 6 months.  multiple creams including steroids without help.  Will be seeing Dr. Denna Haggard on Wednesday.  Patient's last menstrual period was 02/05/2002.          Sexually active: No.  The current method of family planning is tubal ligation and post menopausal status.    Exercising: Yes.    yoga, yardwork Smoker:  no  Health Maintenance: Pap:  04/09/15, ASCUS with neg HR HPV, pap with co test 2017 MMG: 10/24/15, Bi-Rads 1:  Negative  Colonoscopy: Flex Sig 06/10/12, normal; follow with Air Contrast Barium Enema; 07/22/12 BE: Tortuous colon.  No persistent polypoid lesion. BMD: 10/24/15 T-Score -1.7 Spine / -1.6 Left Femur Neck TDaP:  04/06/13 Shingles: unsure Pneumonia: 12/10/ 2015 Hep C and HIV:  Labs: PCP   reports that she has been smoking Cigarettes.  She has been smoking about 0.50 packs per day. She has never used smokeless tobacco. She reports that she drinks about 4.2 oz of alcohol per week . She reports that she does not use drugs.  Past Medical History:  Diagnosis Date  . Arthritis   . Breast cancer 2011   right invasive cancer  . Colon polyps 2001  . Complex endometrial hyperplasia without atypia 2000  . Hearing loss 2005   Right ear, 60%  . Herniated disc 2003   L5-SI  . Hormone replacement therapy (postmenopausal)    2001- DC 2012 with cancer diagnosis  . Menopause   . Osteoporosis   . S/P breast augmentation 1987    Past Surgical History:  Procedure Laterality Date  . APPENDECTOMY  1983   LSO- Serous cystoadenoma  . BREAST SURGERY Bilateral 1987   bilateral breast augmentation  . BREAST SURGERY  03/13/11   right mastectomy-sln,ERPR+,Her2-, Reconstruction  . left index finger fusion     10/07; 3/08  . SALPINGECTOMY Right 1970's   Ectopic  . TUBAL LIGATION      Current Outpatient Prescriptions  Medication Sig Dispense  Refill  . aspirin 81 MG tablet Take 81 mg by mouth daily.    [name] Bioflavonoid Products (PERIDIN-C PO) Take 2 tablets by mouth 2 (two) times daily.    . Calcium Carbonate-Vitamin D 600-400 MG-UNIT per chew tablet Chew 1 tablet by mouth daily.    . Cholecalciferol (VITAMIN D-3) 1000 UNITS CAPS Take by mouth.    . clobetasol cream (TEMOVATE) 0.05 %     . doxylamine, Sleep, (UNISOM) 25 MG tablet Take 25 mg by mouth at bedtime as needed.    . Flaxseed, Linseed, (FLAX SEEDS PO) Take 1,000 mg by mouth 1 day or 1 dose.    . fluticasone (FLONASE) 50 MCG/ACT nasal spray Place 2 sprays into the nose daily.    [name] FLUZONE HIGH-DOSE 0.5 ML SUSY   0  . gabapentin (NEURONTIN) 100 MG capsule Take 100 mg by mouth 2 (two) times daily.      [name] ibuprofen (ADVIL,MOTRIN) 800 MG tablet Take by mouth.    . metroNIDAZOLE (METROCREAM) 0.75 % cream Apply topically 2 (two) times daily.    . Misc Natural Products (OSTEO BI-FLEX JOINT SHIELD PO) Take by mouth.    . mupirocin ointment (BACTROBAN) 2 % Place 1 application into the nose 2 (two) times daily.    . ranitidine (ZANTAC) 150 MG tablet Take by mouth as needed.     [name]  tamoxifen (NOLVADEX) 20 MG tablet Take 1 tablet (20 mg total) by mouth daily. 30 tablet 3  . venlafaxine XR (EFFEXOR-XR) 75 MG 24 hr capsule TAKE 1 CAPSULE BY MOUTH DAILY 90 capsule 3  . Vitamin D, Ergocalciferol, (DRISDOL) 50000 UNITS CAPS TAKE 1 CAPSULE BY MOUTH EVERY OTHER WEEK 26 capsule 0   No current facility-administered medications for this visit.     Family History  Problem Relation Age of Onset  . Breast cancer Mother 67  . Lupus Sister     pt's twin  . Pancreatic cancer Father   . Cancer - Other Brother     oral cancer- resolved  . Heart failure Maternal Grandmother   . Heart failure Maternal Grandfather   . Diabetes Paternal Grandmother   . Heart failure Paternal Grandfather   . Rheum arthritis Sister     ROS:  Pertinent items are noted in HPI.  Otherwise, a comprehensive ROS was  negative.  Exam:   LMP 02/05/2002    Ht Readings from Last 3 Encounters:  11/28/15 '5\' 4"'  (1.626 m)  04/09/15 '5\' 4"'  (1.626 m)  11/28/14 5' 3.5" (1.613 m)    General appearance: alert, cooperative and appears stated age Head: Normocephalic, without obvious abnormality, atraumatic Neck: no adenopathy, supple, symmetrical, trachea midline and thyroid normal to inspection and palpation Lungs: clear to auscultation bilaterally Breasts: normal appearance, no masses or tenderness, on the left without mass.  Right breast mastecomy with implant w/o nipple.  No mass.   Heart: regular rate and rhythm Abdomen: soft, non-tender; no masses,  no organomegaly Extremities: extremities normal, atraumatic, no cyanosis or edema Skin: Skin color, texture, turgor normal. No rashes or lesions.  Both hands have multiple peeling areas on palmer surfaces Lymph nodes: Cervical, supraclavicular, and axillary nodes normal. No abnormal inguinal nodes palpated Neurologic: Grossly normal   Pelvic: External genitalia:  no lesions              Urethra:  normal appearing urethra with no masses, tenderness or lesions              Bartholin's and Skene's: normal                 Vagina: normal appearing vagina with normal color and discharge, no lesions              Cervix: anteverted              Pap taken: Yes.   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  ER/PR +, Her 2 neg.              S/P Breast reconstruction 2012              Osteopenia - stopped Boniva secondary to SE                Atrophic vaginitis  History of  abnormal pap and negative HR HPV    P:   Reviewed health and wellness pertinent to exam  Pap smear is done with co testing  Mammogram is due 10/2016  Will follow with labs  Counseled on breast self exam, mammography screening, adequate intake of calcium and vitamin D, diet and exercise, Kegel's exercises return annually or prn  An After Visit Summary was printed and given to the patient.

## 2016-04-13 NOTE — Patient Instructions (Signed)

## 2016-04-14 DIAGNOSIS — L409 Psoriasis, unspecified: Secondary | ICD-10-CM | POA: Diagnosis not present

## 2016-04-14 DIAGNOSIS — L603 Nail dystrophy: Secondary | ICD-10-CM | POA: Diagnosis not present

## 2016-04-14 DIAGNOSIS — D239 Other benign neoplasm of skin, unspecified: Secondary | ICD-10-CM | POA: Diagnosis not present

## 2016-04-14 LAB — HEPATITIS C ANTIBODY: HCV Ab: NEGATIVE

## 2016-04-15 DIAGNOSIS — H43813 Vitreous degeneration, bilateral: Secondary | ICD-10-CM | POA: Diagnosis not present

## 2016-04-15 DIAGNOSIS — H353122 Nonexudative age-related macular degeneration, left eye, intermediate dry stage: Secondary | ICD-10-CM | POA: Diagnosis not present

## 2016-04-15 DIAGNOSIS — H353111 Nonexudative age-related macular degeneration, right eye, early dry stage: Secondary | ICD-10-CM | POA: Diagnosis not present

## 2016-04-15 LAB — IPS PAP TEST WITH HPV

## 2016-04-17 ENCOUNTER — Encounter: Payer: Self-pay | Admitting: Nurse Practitioner

## 2016-04-17 NOTE — Progress Notes (Signed)
70 y.o. G62P0010 Married  Caucasian Fe here for annual exam.  No new diagnosis.  Still on Tamoxifen.  Bilateral hand peeling that seems to present X 6 months.  multiple creams including steroids without help.  Will be seeing Dr. Denna Haggard on Wednesday.  Patient's last menstrual period was 02/05/2002.          Sexually active: No.  The current method of family planning is tubal ligation and post menopausal status.    Exercising: Yes.    yoga, yardwork Smoker:  no  Health Maintenance: Pap:  04/09/15, ASCUS with neg HR HPV, pap with co test 2017 MMG: 10/24/15, Bi-Rads 1:  Negative  Colonoscopy: Flex Sig 06/10/12, normal; follow with Air Contrast Barium Enema; 07/22/12 BE: Tortuous colon.  No persistent polypoid lesion. BMD: 10/24/15 T-Score -1.7 Spine / -1.6 Left Femur Neck TDaP:  04/06/13 Shingles: unsure Pneumonia: 12/10/ 2015 Hep C and HIV:  Labs: PCP   reports that she has been smoking Cigarettes.  She has been smoking about 0.50 packs per day. She has never used smokeless tobacco. She reports that she drinks about 4.2 oz of alcohol per week . She reports that she does not use drugs.  Past Medical History:  Diagnosis Date  . Arthritis   . Breast cancer 2011   right invasive cancer  . Colon polyps 2001  . Complex endometrial hyperplasia without atypia 2000  . Hearing loss 2005   Right ear, 60%  . Herniated disc 2003   L5-SI  . Hormone replacement therapy (postmenopausal)    2001- DC 2012 with cancer diagnosis  . Menopause   . Osteoporosis   . S/P breast augmentation 1987    Past Surgical History:  Procedure Laterality Date  . APPENDECTOMY  1983   LSO- Serous cystoadenoma  . BREAST SURGERY Bilateral 1987   bilateral breast augmentation  . BREAST SURGERY  03/13/11   right mastectomy-sln,ERPR+,Her2-, Reconstruction  . left index finger fusion     10/07; 3/08  . SALPINGECTOMY Right 1970's   Ectopic  . TUBAL LIGATION      Current Outpatient Prescriptions  Medication Sig Dispense  Refill  . aspirin 81 MG tablet Take 81 mg by mouth daily.    [name] Bioflavonoid Products (PERIDIN-C PO) Take 2 tablets by mouth 2 (two) times daily.    . Calcium Carbonate-Vitamin D 600-400 MG-UNIT per chew tablet Chew 1 tablet by mouth daily.    . Cholecalciferol (VITAMIN D-3) 1000 UNITS CAPS Take by mouth.    . clobetasol cream (TEMOVATE) 0.05 %     . doxylamine, Sleep, (UNISOM) 25 MG tablet Take 25 mg by mouth at bedtime as needed.    . Flaxseed, Linseed, (FLAX SEEDS PO) Take 1,000 mg by mouth 1 day or 1 dose.    . fluticasone (FLONASE) 50 MCG/ACT nasal spray Place 2 sprays into the nose daily.    [name] FLUZONE HIGH-DOSE 0.5 ML SUSY   0  . gabapentin (NEURONTIN) 100 MG capsule Take 100 mg by mouth 2 (two) times daily.      [name] ibuprofen (ADVIL,MOTRIN) 800 MG tablet Take by mouth.    . metroNIDAZOLE (METROCREAM) 0.75 % cream Apply topically 2 (two) times daily.    . Misc Natural Products (OSTEO BI-FLEX JOINT SHIELD PO) Take by mouth.    . mupirocin ointment (BACTROBAN) 2 % Place 1 application into the nose 2 (two) times daily.    . ranitidine (ZANTAC) 150 MG tablet Take by mouth as needed.     [name]  tamoxifen (NOLVADEX) 20 MG tablet Take 1 tablet (20 mg total) by mouth daily. 30 tablet 3  . venlafaxine XR (EFFEXOR-XR) 75 MG 24 hr capsule TAKE 1 CAPSULE BY MOUTH DAILY 90 capsule 3  . Vitamin D, Ergocalciferol, (DRISDOL) 50000 UNITS CAPS TAKE 1 CAPSULE BY MOUTH EVERY OTHER WEEK 26 capsule 0   No current facility-administered medications for this visit.     Family History  Problem Relation Age of Onset  . Breast cancer Mother 70  . Lupus Sister     pt's twin  . Pancreatic cancer Father   . Cancer - Other Brother     oral cancer- resolved  . Heart failure Maternal Grandmother   . Heart failure Maternal Grandfather   . Diabetes Paternal Grandmother   . Heart failure Paternal Grandfather   . Rheum arthritis Sister     ROS:  Pertinent items are noted in HPI.  Otherwise, a comprehensive ROS was  negative.  Exam:   LMP 02/05/2002    Ht Readings from Last 3 Encounters:  11/28/15 '5\' 4"'  (1.626 m)  04/09/15 '5\' 4"'  (1.626 m)  11/28/14 5' 3.5" (1.613 m)    General appearance: alert, cooperative and appears stated age Head: Normocephalic, without obvious abnormality, atraumatic Neck: no adenopathy, supple, symmetrical, trachea midline and thyroid normal to inspection and palpation Lungs: clear to auscultation bilaterally Breasts: normal appearance, no masses or tenderness, on the left without mass.  Right breast mastecomy with implant w/o nipple.  No mass.   Heart: regular rate and rhythm Abdomen: soft, non-tender; no masses,  no organomegaly Extremities: extremities normal, atraumatic, no cyanosis or edema Skin: Skin color, texture, turgor normal. No rashes or lesions.  Both hands have multiple peeling areas on palmer surfaces Lymph nodes: Cervical, supraclavicular, and axillary nodes normal. No abnormal inguinal nodes palpated Neurologic: Grossly normal   Pelvic: External genitalia:  no lesions              Urethra:  normal appearing urethra with no masses, tenderness or lesions              Bartholin's and Skene's: normal                 Vagina: normal appearing vagina with normal color and discharge, no lesions              Cervix: anteverted              Pap taken: Yes.   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  ER/PR +, Her 2 neg.              S/P Breast reconstruction 2012              Osteopenia - stopped Boniva secondary to SE                Atrophic vaginitis  History of  abnormal pap and negative HR HPV    P:   Reviewed health and wellness pertinent to exam  Pap smear is done with co testing  Mammogram is due 10/2016  Will follow with labs  Counseled on breast self exam, mammography screening, adequate intake of calcium and vitamin D, diet and exercise, Kegel's exercises return annually or prn  An After Visit Summary was printed and given to the patient.

## 2016-04-23 ENCOUNTER — Other Ambulatory Visit: Payer: Self-pay | Admitting: Nurse Practitioner

## 2016-04-23 NOTE — Telephone Encounter (Signed)
Medication refill request: Venlafaxin XR 75mg  Last AEX:  04/13/16 PG Next AEX: 04/14/17 Last MMG (if hormonal medication request): 10/24/15 BIRADS1 negative Refill authorized: 04/09/15 #90 w/3 refills; today #90 w/3 refills? Please advise

## 2016-04-24 ENCOUNTER — Telehealth: Payer: Self-pay

## 2016-04-24 NOTE — Telephone Encounter (Signed)
Called patient at [phone] to discuss pap results, left message to call me back.

## 2016-04-24 NOTE — Telephone Encounter (Signed)
-----   Message from Kem Boroughs, Cochran sent at 04/22/2016  8:32 AM EDT ----- Please let pt know that pap again this year shows ASCUS but negative HR HPV.  Dr. Sabra Heck suggest a repeat pap in a year but have her use OTC Coconut oil and Replens about 2 weeks prior to pap as this may be secondary to atrophy. 08

## 2016-04-24 NOTE — Telephone Encounter (Signed)
Patient left a message on the answering machine at lunch returning Amanda's call.

## 2016-04-24 NOTE — Telephone Encounter (Signed)
Spoke with patient and notified of pap results. She verbalized understanding of using OTC coconut oil and Replens for 2 weeks prior to pap next year. 08 recall entered.

## 2016-05-19 ENCOUNTER — Other Ambulatory Visit: Payer: Self-pay | Admitting: Oncology

## 2016-05-25 DIAGNOSIS — L259 Unspecified contact dermatitis, unspecified cause: Secondary | ICD-10-CM | POA: Diagnosis not present

## 2016-05-27 DIAGNOSIS — H6122 Impacted cerumen, left ear: Secondary | ICD-10-CM | POA: Diagnosis not present

## 2016-05-27 DIAGNOSIS — Z029 Encounter for administrative examinations, unspecified: Secondary | ICD-10-CM | POA: Diagnosis not present

## 2016-05-27 DIAGNOSIS — H903 Sensorineural hearing loss, bilateral: Secondary | ICD-10-CM | POA: Diagnosis not present

## 2016-05-28 DIAGNOSIS — L400 Psoriasis vulgaris: Secondary | ICD-10-CM | POA: Diagnosis not present

## 2016-05-28 DIAGNOSIS — L309 Dermatitis, unspecified: Secondary | ICD-10-CM | POA: Diagnosis not present

## 2016-06-05 DIAGNOSIS — Z23 Encounter for immunization: Secondary | ICD-10-CM | POA: Diagnosis not present

## 2016-06-09 ENCOUNTER — Ambulatory Visit (INDEPENDENT_AMBULATORY_CARE_PROVIDER_SITE_OTHER): Payer: Medicare Other | Admitting: Rheumatology

## 2016-06-09 DIAGNOSIS — M19071 Primary osteoarthritis, right ankle and foot: Secondary | ICD-10-CM | POA: Diagnosis not present

## 2016-06-09 DIAGNOSIS — M19041 Primary osteoarthritis, right hand: Secondary | ICD-10-CM | POA: Diagnosis not present

## 2016-06-09 DIAGNOSIS — M170 Bilateral primary osteoarthritis of knee: Secondary | ICD-10-CM

## 2016-07-08 DIAGNOSIS — L409 Psoriasis, unspecified: Secondary | ICD-10-CM | POA: Diagnosis not present

## 2016-09-22 ENCOUNTER — Other Ambulatory Visit: Payer: Self-pay | Admitting: *Deleted

## 2016-09-22 MED ORDER — TAMOXIFEN CITRATE 20 MG PO TABS
20.0000 mg | ORAL_TABLET | Freq: Every day | ORAL | 3 refills | Status: DC
Start: 2016-09-22 — End: 2017-05-06

## 2016-09-28 ENCOUNTER — Other Ambulatory Visit: Payer: Self-pay | Admitting: *Deleted

## 2016-10-13 ENCOUNTER — Other Ambulatory Visit: Payer: Self-pay | Admitting: Nurse Practitioner

## 2016-10-13 ENCOUNTER — Other Ambulatory Visit: Payer: Self-pay | Admitting: *Deleted

## 2016-10-13 DIAGNOSIS — Z1231 Encounter for screening mammogram for malignant neoplasm of breast: Secondary | ICD-10-CM

## 2016-10-14 DIAGNOSIS — H6122 Impacted cerumen, left ear: Secondary | ICD-10-CM | POA: Diagnosis not present

## 2016-10-20 DIAGNOSIS — H353122 Nonexudative age-related macular degeneration, left eye, intermediate dry stage: Secondary | ICD-10-CM | POA: Diagnosis not present

## 2016-10-20 DIAGNOSIS — H353111 Nonexudative age-related macular degeneration, right eye, early dry stage: Secondary | ICD-10-CM | POA: Diagnosis not present

## 2016-11-11 ENCOUNTER — Ambulatory Visit
Admission: RE | Admit: 2016-11-11 | Discharge: 2016-11-11 | Disposition: A | Discharge status: Home / Self Care | Payer: Medicare Other | Source: Ambulatory Visit | Attending: Nurse Practitioner | Admitting: Nurse Practitioner

## 2016-11-11 ENCOUNTER — Ambulatory Visit
Admission: RE | Admit: 2016-11-11 | Discharge: 2016-11-11 | Disposition: A | Discharge status: Home / Self Care | Payer: PPO | Source: Ambulatory Visit | Attending: Nurse Practitioner | Admitting: Nurse Practitioner

## 2016-11-11 ENCOUNTER — Other Ambulatory Visit: Payer: Self-pay | Admitting: Nurse Practitioner

## 2016-11-11 DIAGNOSIS — Z1231 Encounter for screening mammogram for malignant neoplasm of breast: Secondary | ICD-10-CM

## 2016-11-17 DIAGNOSIS — M1990 Unspecified osteoarthritis, unspecified site: Secondary | ICD-10-CM | POA: Diagnosis not present

## 2016-11-17 DIAGNOSIS — R21 Rash and other nonspecific skin eruption: Secondary | ICD-10-CM | POA: Diagnosis not present

## 2016-11-17 DIAGNOSIS — L409 Psoriasis, unspecified: Secondary | ICD-10-CM | POA: Diagnosis not present

## 2016-11-17 DIAGNOSIS — F17210 Nicotine dependence, cigarettes, uncomplicated: Secondary | ICD-10-CM | POA: Diagnosis not present

## 2016-11-18 ENCOUNTER — Other Ambulatory Visit: Payer: Self-pay | Admitting: *Deleted

## 2016-11-18 DIAGNOSIS — C50411 Malignant neoplasm of upper-outer quadrant of right female breast: Secondary | ICD-10-CM

## 2016-11-19 ENCOUNTER — Other Ambulatory Visit (HOSPITAL_BASED_OUTPATIENT_CLINIC_OR_DEPARTMENT_OTHER): Payer: PPO

## 2016-11-19 DIAGNOSIS — C50911 Malignant neoplasm of unspecified site of right female breast: Secondary | ICD-10-CM

## 2016-11-19 DIAGNOSIS — C50411 Malignant neoplasm of upper-outer quadrant of right female breast: Secondary | ICD-10-CM

## 2016-11-19 LAB — CBC WITH DIFFERENTIAL/PLATELET
BASO%: 0.7 % (ref 0.0–2.0)
Basophils Absolute: 0.1 10*3/uL (ref 0.0–0.1)
EOS%: 2.2 % (ref 0.0–7.0)
Eosinophils Absolute: 0.2 10*3/uL (ref 0.0–0.5)
HCT: 44.2 % (ref 34.8–46.6)
HGB: 14.6 g/dL (ref 11.6–15.9)
LYMPH%: 28.6 % (ref 14.0–49.7)
MCH: 32 pg (ref 25.1–34.0)
MCHC: 33 g/dL (ref 31.5–36.0)
MCV: 96.9 fL (ref 79.5–101.0)
MONO#: 0.8 10*3/uL (ref 0.1–0.9)
MONO%: 11.5 % (ref 0.0–14.0)
NEUT#: 4.1 10*3/uL (ref 1.5–6.5)
NEUT%: 57 % (ref 38.4–76.8)
Platelets: 319 10*3/uL (ref 145–400)
RBC: 4.56 10*6/uL (ref 3.70–5.45)
RDW: 14.8 % — ABNORMAL HIGH (ref 11.2–14.5)
WBC: 7.1 10*3/uL (ref 3.9–10.3)
lymph#: 2 10*3/uL (ref 0.9–3.3)

## 2016-11-19 LAB — COMPREHENSIVE METABOLIC PANEL
ALT: 15 U/L (ref 0–55)
AST: 17 U/L (ref 5–34)
Albumin: 4 g/dL (ref 3.5–5.0)
Alkaline Phosphatase: 84 U/L (ref 40–150)
Anion Gap: 8 mEq/L (ref 3–11)
BUN: 26.4 mg/dL — ABNORMAL HIGH (ref 7.0–26.0)
CO2: 28 mEq/L (ref 22–29)
Calcium: 9.8 mg/dL (ref 8.4–10.4)
Chloride: 107 mEq/L (ref 98–109)
Creatinine: 1.2 mg/dL — ABNORMAL HIGH (ref 0.6–1.1)
EGFR: 46 mL/min/{1.73_m2} — ABNORMAL LOW (ref >90)
Glucose: 74 mg/dl (ref 70–140)
Potassium: 5.2 mEq/L — ABNORMAL HIGH (ref 3.5–5.1)
Sodium: 144 mEq/L (ref 136–145)
Total Bilirubin: 0.35 mg/dL (ref 0.20–1.20)
Total Protein: 6.8 g/dL (ref 6.4–8.3)

## 2016-11-26 ENCOUNTER — Ambulatory Visit (HOSPITAL_BASED_OUTPATIENT_CLINIC_OR_DEPARTMENT_OTHER): Payer: PPO | Admitting: Oncology

## 2016-11-26 VITALS — BP 131/77 | HR 106 | Temp 97.9°F | Resp 18 | Ht 63.0 in | Wt 130.4 lb

## 2016-11-26 DIAGNOSIS — C50911 Malignant neoplasm of unspecified site of right female breast: Secondary | ICD-10-CM

## 2016-11-26 DIAGNOSIS — M8580 Other specified disorders of bone density and structure, unspecified site: Secondary | ICD-10-CM | POA: Diagnosis not present

## 2016-11-26 DIAGNOSIS — Z79810 Long term (current) use of selective estrogen receptor modulators (SERMs): Secondary | ICD-10-CM

## 2016-11-26 DIAGNOSIS — Z170 Estrogen receptor positive status [ER+]: Secondary | ICD-10-CM | POA: Diagnosis not present

## 2016-11-26 DIAGNOSIS — C50411 Malignant neoplasm of upper-outer quadrant of right female breast: Secondary | ICD-10-CM

## 2016-11-26 NOTE — Progress Notes (Signed)
[facility]  [phone] Fax:([phone]) [phone]     ID: [name] DOB: 16-Jan-1946  MR#: [id number]  DJS#:[id number]  Patient Care Team: Chauncey Cruel, MD as PCP - General (Oncology) Chauncey Cruel, MD as Consulting Physician (Oncology) GYN: Elveria Rising M.D. SU: Star Age MD OTHER MD: Crissie Reese M.D.  CHIEF COMPLAINT: Estrogen receptor positive breast cancer  CURRENT TREATMENT: Completing 5 years of anti-estrogen  BREAST CANCER HISTORY: From Dr. Bernell List [name]'s 05/04/2013 summary:  #1 patient underwent core needle biopsy that showed an invasive ductal carcinoma low grade ER positive PR positive HER-2/neu negative. This is in 2011.   #2 patient went on to receive neoadjuvant chemotherapy consisting of FEC 100 followed by 7 weeks of single agent Taxol.  #3 patient A mastectomy of the right breast with the final pathology revealing a T1 C. N0 disease.  #4 patient has had right breast reconstruction performed by Dr. Crissie Reese.  #5 she was initially on letrozole 2.5 mg daily since August 2012. She subsequently discontinued this and we started her on tamoxifen. She thereafter discontinued that as well and was started on Arimidex. She stopped this for a few days and then went back on starting on January 2013.  Her subsequent history is as detailed below  INTERVAL HISTORY: [name] returns today for follow-up of her estrogen receptor positive breast cancer, accompanied by her husband. The interval history is generally unremarkable. She continues on tamoxifen, with hot flashes as her main symptom. Vaginal wetness is not a major problem. She obtains a drug at a good price.  REVIEW OF SYSTEMS: [name] has been definitively diagnosed with psoriasis and she has significant arthritis as well, but from her report this is not psoriatic arthritis. Nevertheless she has been started on methotrexate. She has only had 1 dose (10 mg weekly). So far she appears to  be tolerating it well. A second major problem is insomnia. She goes to bed at 2 or 3 in the morning, wakes up at noon most days. To follow sleep she takes Unisom, gabapentin, and melatonin every night. Aside from these issues she does quite a bit of gardening--she doesn't do any other form of exercise. A detailed review of systems today was otherwise noncontributory -- PAST MEDICAL HISTORY: Past Medical History:  Diagnosis Date  . Arthritis   . Breast cancer ([place]) 2011   right invasive cancer  . Colon polyps 2001  . Complex endometrial hyperplasia without atypia 2000  . Hearing loss 2005   Right ear, 60%  . Herniated disc 2003   L5-SI  . Hormone replacement therapy (postmenopausal)    2001- DC 2012 with cancer diagnosis  . Menopause   . Osteoporosis   . Pap smear abnormality of cervix with ASCUS favoring benign 03/2015   HR HPV negative  . S/P breast augmentation 1987    PAST SURGICAL HISTORY: Past Surgical History:  Procedure Laterality Date  . APPENDECTOMY  1983   LSO- Serous cystoadenoma  . BREAST SURGERY Bilateral 1987   bilateral breast augmentation  . BREAST SURGERY  03/13/11   right mastectomy-sln,ERPR+,Her2-, Reconstruction  . left index finger fusion     10/07; 3/08  . SALPINGECTOMY Right 1970's   Ectopic  . TUBAL LIGATION      FAMILY HISTORY Family History  Problem Relation Age of Onset  . Breast cancer Mother 86  . Lupus Sister     pt's twin  . Pancreatic cancer Father   . Cancer -  Other Brother     oral cancer- resolved  . Dementia Brother   . Heart failure Maternal Grandmother   . Heart failure Maternal Grandfather   . Diabetes Paternal Grandmother   . Heart failure Paternal Grandfather   . Rheum arthritis Sister    the patient's father died from pancreatic cancer at the age of 1. The patient's mother is currently 35 years old, lives independently in [place]. She was diagnosed with breast cancer in her 37s. The patient had one brother, a twin  sister and another sister. There is no other history of breast or ovarian cancer in the family.  GYNECOLOGIC HISTORY:  Patient's last menstrual period was 02/05/2002 (within weeks). Menarche age 65, the patient is GX P0. She took hormone replacement for approximately 20 years. She went through the change of life approximately age 26.  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 Her husband [name] sells real estate. They have no children and no pets and are not church attender's.     ADVANCED DIRECTIVES:  living will in place   HEALTH MAINTENANCE: Social History  Substance Use Topics  . Smoking status: Current Every Day Smoker    Packs/day: 0.50    Types: Cigarettes  . Smokeless tobacco: Never Used  . Alcohol use 4.2 oz/week    7 Standard drinks or equivalent per week     Comment: 1 cocktail q morning     Colonoscopy:  PAP:  Bone density: 09/05/2013 showed a T score of -2.0  Lipid panel:  Allergies  Allergen Reactions  . Dilaudid [Hydromorphone Hcl] Nausea And Vomiting and Other (See Comments)    Gastrointestinal distress  . Sulfa Antibiotics Nausea And Vomiting and Other (See Comments)    Gastrointestinal distress    Current Outpatient Prescriptions  Medication Sig Dispense Refill  . aspirin 81 MG tablet Take 81 mg by mouth daily.    [name] Bioflavonoid Products (PERIDIN-C PO) Take 2 tablets by mouth 2 (two) times daily.    . Calcium Carbonate-Vitamin D 600-400 MG-UNIT per chew tablet Chew 1 tablet by mouth daily.    . Cholecalciferol (VITAMIN D-3) 1000 UNITS CAPS Take by mouth.    . clobetasol cream (TEMOVATE) 0.05 %     . doxylamine, Sleep, (UNISOM) 25 MG tablet Take 25 mg by mouth at bedtime as needed.    . Flaxseed, Linseed, (FLAX SEEDS PO) Take 1,000 mg by mouth 1 day or 1 dose.    . fluticasone (FLONASE) 50 MCG/ACT nasal spray Place 2 sprays into the nose daily.    [name] gabapentin (NEURONTIN) 100 MG capsule Take 100 mg by mouth 2 (two)  times daily.      [name] ibuprofen (ADVIL,MOTRIN) 800 MG tablet Take by mouth.    . Misc Natural Products (OSTEO BI-FLEX JOINT SHIELD PO) Take by mouth.    . Multiple Vitamins-Minerals (PRESERVISION AREDS 2 PO) Take 2 tablets by mouth daily.    . ranitidine (ZANTAC) 150 MG tablet Take by mouth as needed.     . tamoxifen (NOLVADEX) 20 MG tablet Take 1 tablet (20 mg total) by mouth daily. 30 tablet 3  . Turmeric 450 MG CAPS Take 1 capsule by mouth daily.    [name] venlafaxine XR (EFFEXOR-XR) 75 MG 24 hr capsule take 1 capsule by mouth once daily 90 capsule 3   No current facility-administered medications for this visit.     OBJECTIVE: Middle-aged white womanWho appears stated age 38:   11/26/16  1359  BP: 131/77  Pulse: (!) 106  Resp: 18  Temp: 97.9 F (36.6 C)     Body mass index is 23.1 kg/m.    ECOG FS:1 - Symptomatic but completely ambulatory  Sclerae unicteric, pupils round and equal Oropharynx clear and moist No cervical or supraclavicular adenopathy Lungs no rales or rhonchi Heart regular rate and rhythm Abd soft, nontender, positive bowel sounds MSK no focal spinal tenderness, no upper extremity lymphedema Neuro: nonfocal, well oriented, appropriate affect Breasts: The right breast is status post mastectomy with implant reconstruction. There is no evidence of local recurrence, no pericapsular fluid and no irregularity. The right axilla is benign. The left breast is status post reduction mammoplasty, with no suspicious findings. The left axilla is benign.  LAB RESULTS:  CMP     Component Value Date/Time   NA 144 11/19/2016 1329   K 5.2 (H) 11/19/2016 1329   CL 104 11/01/2012 1055   CO2 28 11/19/2016 1329   GLUCOSE 74 11/19/2016 1329   GLUCOSE 93 11/01/2012 1055   BUN 26.4 (H) 11/19/2016 1329   CREATININE 1.2 (H) 11/19/2016 1329   CALCIUM 9.8 11/19/2016 1329   PROT 6.8 11/19/2016 1329   ALBUMIN 4.0 11/19/2016 1329   AST 17 11/19/2016 1329   ALT 15 11/19/2016 1329    ALKPHOS 84 11/19/2016 1329   BILITOT 0.35 11/19/2016 1329   GFRNONAA >60 02/26/2011 1034   GFRAA >60 02/26/2011 1034    INo results found for: SPEP, UPEP  Lab Results  Component Value Date   WBC 7.1 11/19/2016   NEUTROABS 4.1 11/19/2016   HGB 14.6 11/19/2016   HCT 44.2 11/19/2016   MCV 96.9 11/19/2016   PLT 319 11/19/2016      Chemistry      Component Value Date/Time   NA 144 11/19/2016 1329   K 5.2 (H) 11/19/2016 1329   CL 104 11/01/2012 1055   CO2 28 11/19/2016 1329   BUN 26.4 (H) 11/19/2016 1329   CREATININE 1.2 (H) 11/19/2016 1329      Component Value Date/Time   CALCIUM 9.8 11/19/2016 1329   ALKPHOS 84 11/19/2016 1329   AST 17 11/19/2016 1329   ALT 15 11/19/2016 1329   BILITOT 0.35 11/19/2016 1329       Lab Results  Component Value Date   LABCA2 30 09/09/2010    No components found for: [id number]  No results for input(s): INR in the last 168 hours.  Urinalysis    Component Value Date/Time   BILIRUBINUR - 04/09/2015 1442   PROTEINUR Trace 04/09/2015 1442   UROBILINOGEN negative 04/09/2015 1442   NITRITE - 04/09/2015 1442   LEUKOCYTESUR Negative 04/09/2015 1442    STUDIES: She [facility], showing no evidence of the breast density was category C.  ASSESSMENT: 71 y.o. [place] woman   (1) Status post right breast biopsy 08/18/2010(SAA [phone]) for a clinical T1c N0, stage IA invasive ductal carcinoma, grade 1, estrogen receptor 97% positive, progesterone receptor 98% positive, with an MIB-1 of 15% and no HER-2 amplification (ratio 1.60).  (2) treated neoadjuvantly with cyclophosphamide, fluorouracil, and epirubicin in dose dense fashion 4 cycles, completed 10/31/2010, followed (according to the 10/19/2011 oncology note) by 7 of 12 planned doses of weekly paclitaxel  (3) status post right mastectomy and sentinel lymph node sampling 03/03/2011, showing (SZA  [phone]) a pT1c pN0, stage IA invasive  ductal carcinoma, grade 1, with ample margins  (4) Letrozole started August 2012, switched  to tamoxifen then definitively on anastrozole as of January 2013, discontinued July 2015.  (a) resumed tamoxifen 09/07/2014  (5) osteopenia, formerly on ibandronate  (a) bone density 10/24/2015 showed an improved T score of -1.7.   PLAN: I spent approximately 30 minutes with Charlett Nose with most of that time spent discussing several of her problems that concerns him of course also going over her breast cancer situation.   [name] is now just about 6 years out from definitive surgery for her breast cancer with no evidence of disease recurrence. This is very favorable.  She has been on anti-estrogens for 5-1/2 years. At this point I feel comfortable with her discontinuing this medication particularly as it is continuing to cause her some unpleasant side effects, mostly the hot flashes. The benefit of continuing tamoxifen another 4 or 5 years would be in the 1% range as far as distant disease recurrence is concerned. She is very pleased at being able to come off this medication  We discussed her very unusual sleep habits. The one thing she can control is when she gets out of bed and if she gets out of it the same time every day that it doesn't really matter which time of day that is. The second thing that would help first to keep the room very cold and we discussed that at length today  Otherwise I'm comfortable releasing her to her primary care physician. All she will need in terms of breast cancer follow-up is yearly mammography, which she normally obtains in March at the [facility], and a yearly physician breast exam.  She also also concerned about her methotrexate. She understands many patients are able to tolerate this for years without any significant complications. She does have to watch out for liver toxicity, for pulmonary symptoms including atypical pneumonia as an PCP and of course cytopenias. She is  going to be followed through her with an physician regarding these problems.  I will be glad to see [name] at any point in the future as needed but as of now we are making no further return appointment for her here.  Chauncey Cruel, MD   11/26/2016 2:04 PM

## 2016-11-27 NOTE — Addendum Note (Signed)
Addended by: Laureen Abrahams on: 11/27/2016 12:07 PM   Modules accepted: Orders

## 2016-12-10 DIAGNOSIS — L409 Psoriasis, unspecified: Secondary | ICD-10-CM | POA: Diagnosis not present

## 2016-12-10 DIAGNOSIS — Z79899 Other long term (current) drug therapy: Secondary | ICD-10-CM | POA: Diagnosis not present

## 2017-01-18 DIAGNOSIS — L409 Psoriasis, unspecified: Secondary | ICD-10-CM | POA: Insufficient documentation

## 2017-01-18 DIAGNOSIS — M1990 Unspecified osteoarthritis, unspecified site: Secondary | ICD-10-CM | POA: Diagnosis not present

## 2017-01-18 DIAGNOSIS — Z85828 Personal history of other malignant neoplasm of skin: Secondary | ICD-10-CM | POA: Diagnosis not present

## 2017-01-18 DIAGNOSIS — F17210 Nicotine dependence, cigarettes, uncomplicated: Secondary | ICD-10-CM | POA: Diagnosis not present

## 2017-01-18 DIAGNOSIS — R21 Rash and other nonspecific skin eruption: Secondary | ICD-10-CM | POA: Diagnosis not present

## 2017-02-08 DIAGNOSIS — L409 Psoriasis, unspecified: Secondary | ICD-10-CM | POA: Diagnosis not present

## 2017-02-08 DIAGNOSIS — S40012A Contusion of left shoulder, initial encounter: Secondary | ICD-10-CM | POA: Diagnosis not present

## 2017-03-12 ENCOUNTER — Telehealth: Payer: Self-pay | Admitting: Nurse Practitioner

## 2017-03-12 NOTE — Telephone Encounter (Signed)
Left message regarding upcoming appointment has been canceled and needs to be rescheduled. Patient to call later to reschedule.

## 2017-03-25 DIAGNOSIS — H6063 Unspecified chronic otitis externa, bilateral: Secondary | ICD-10-CM | POA: Diagnosis not present

## 2017-03-25 DIAGNOSIS — H6122 Impacted cerumen, left ear: Secondary | ICD-10-CM | POA: Diagnosis not present

## 2017-03-29 DIAGNOSIS — H353122 Nonexudative age-related macular degeneration, left eye, intermediate dry stage: Secondary | ICD-10-CM | POA: Diagnosis not present

## 2017-03-29 DIAGNOSIS — H43813 Vitreous degeneration, bilateral: Secondary | ICD-10-CM | POA: Diagnosis not present

## 2017-03-29 DIAGNOSIS — H353111 Nonexudative age-related macular degeneration, right eye, early dry stage: Secondary | ICD-10-CM | POA: Diagnosis not present

## 2017-04-14 ENCOUNTER — Ambulatory Visit: Payer: Medicare Other | Admitting: Nurse Practitioner

## 2017-04-19 ENCOUNTER — Other Ambulatory Visit: Payer: Self-pay | Admitting: *Deleted

## 2017-04-19 NOTE — Telephone Encounter (Signed)
Faxed refill request received from [facility] for VENLAFAXINE Last filled by MD on 04/23/16, #90 X 3 RF Last AEX - 04/23/16 PG Next AEX - 05/06/17 DL Last MMG - N/A  Please advise refills. Thank you.

## 2017-04-19 NOTE — Telephone Encounter (Signed)
Patient's husband is calling to check on the status of a refill request for Venlafaxine XR

## 2017-04-20 MED ORDER — VENLAFAXINE HCL ER 75 MG PO CP24: 75.0000 mg | ORAL_CAPSULE | Freq: Every day | ORAL | 0 refills | Status: DC

## 2017-04-20 NOTE — Telephone Encounter (Signed)
It's been done.  Please notify pt and family.  Please apologize and let them know Debbi has been out for surgery.

## 2017-04-20 NOTE — Telephone Encounter (Signed)
Left voicemail for pt re: Rx sent to pharmacy.

## 2017-04-26 DIAGNOSIS — M1990 Unspecified osteoarthritis, unspecified site: Secondary | ICD-10-CM | POA: Diagnosis not present

## 2017-04-26 DIAGNOSIS — Z79899 Other long term (current) drug therapy: Secondary | ICD-10-CM | POA: Diagnosis not present

## 2017-04-26 DIAGNOSIS — L409 Psoriasis, unspecified: Secondary | ICD-10-CM | POA: Diagnosis not present

## 2017-05-06 ENCOUNTER — Encounter: Payer: Self-pay | Admitting: Certified Nurse Midwife

## 2017-05-06 ENCOUNTER — Ambulatory Visit (INDEPENDENT_AMBULATORY_CARE_PROVIDER_SITE_OTHER): Payer: PPO | Admitting: Certified Nurse Midwife

## 2017-05-06 ENCOUNTER — Other Ambulatory Visit (HOSPITAL_COMMUNITY)
Admission: RE | Admit: 2017-05-06 | Discharge: 2017-05-06 | Disposition: A | Discharge status: Home / Self Care | Payer: PPO | Source: Ambulatory Visit | Attending: Certified Nurse Midwife | Admitting: Certified Nurse Midwife

## 2017-05-06 VITALS — BP 118/82 | HR 76 | Resp 16 | Ht 63.25 in | Wt 128.0 lb

## 2017-05-06 DIAGNOSIS — Z01419 Encounter for gynecological examination (general) (routine) without abnormal findings: Secondary | ICD-10-CM

## 2017-05-06 DIAGNOSIS — Z124 Encounter for screening for malignant neoplasm of cervix: Secondary | ICD-10-CM | POA: Insufficient documentation

## 2017-05-06 DIAGNOSIS — N951 Menopausal and female climacteric states: Secondary | ICD-10-CM | POA: Diagnosis not present

## 2017-05-06 DIAGNOSIS — N952 Postmenopausal atrophic vaginitis: Secondary | ICD-10-CM | POA: Diagnosis not present

## 2017-05-06 NOTE — Patient Instructions (Signed)

## 2017-05-06 NOTE — Progress Notes (Signed)
71 y.o. G50P0010 Married  Caucasian Fe here for annual exam. Menopausal denies vaginal bleeding or vaginal dryness. Now off Tamoxifen and feeling better. Continues with oncology follow up yearly. Sees Rheumatology frequently for RA management. Labs and medication with these groups only. Denies vaginal dryness now,using vaginal moisture cream with good results. Ambulating without problems, eating well balanced diet and exercising . Feels well. No other health issues today.    Patient's last menstrual period was 02/05/2002 (within weeks).          Sexually active: No.  The current method of family planning is tubal ligation.    Exercising: Yes.    yoga, walking, yardwork Smoker:  yes  Health Maintenance: Pap:  04-09-15 ASCUS HPV HR neg, 04-13-16 ASCUS HPV HR neg History of Abnormal Pap: yes MMG:  11-11-16 category c density birads 1:neg Self Breast exams: yes Colonoscopy:  Flex Sig 06/10/12, normal; follow with Air Contrast Barium Enema; 07/22/12 BE: Tortuous colon.  No persistent polypoid lesion.  BMD:   2017  TDaP:  2014 Shingles: 2016 Pneumonia: 2016 Hep C and HIV: Hep c neg 2017 Labs: no    reports that she has been smoking Cigarettes.  She has been smoking about 0.50 packs per day. She has never used smokeless tobacco. She reports that she drinks about 3.0 oz of alcohol per week . She reports that she does not use drugs.  Past Medical History:  Diagnosis Date  . Arthritis   . Breast cancer ([place]) 2011   right invasive cancer  . Colon polyps 2001  . Complex endometrial hyperplasia without atypia 2000  . Hearing loss 2005   Right ear, 60%  . Herniated disc 2003   L5-SI  . Hormone replacement therapy (postmenopausal)    2001- DC 2012 with cancer diagnosis  . Menopause   . Osteoporosis   . Pap smear abnormality of cervix with ASCUS favoring benign 03/2015   HR HPV negative  . Psoriasis   . S/P breast augmentation 1987    Past Surgical History:  Procedure Laterality Date  .  APPENDECTOMY  1983   LSO- Serous cystoadenoma  . BREAST SURGERY Bilateral 1987   bilateral breast augmentation  . BREAST SURGERY  03/13/11   right mastectomy-sln,ERPR+,Her2-, Reconstruction  . left index finger fusion     10/07; 3/08  . SALPINGECTOMY Right 1970's   Ectopic  . TUBAL LIGATION      Current Outpatient Prescriptions  Medication Sig Dispense Refill  . aspirin 81 MG tablet Take 81 mg by mouth daily.    [name] Bioflavonoid Products (PERIDIN-C PO) Take 2 tablets by mouth 2 (two) times daily.    . calcipotriene-betamethasone (TACLONEX) ointment     . Calcium Carbonate-Vitamin D 600-400 MG-UNIT per chew tablet Chew 1 tablet by mouth daily.    . Cholecalciferol (VITAMIN D-3) 1000 UNITS CAPS Take by mouth.    . clobetasol ointment (TEMOVATE) 0.05 %     . doxylamine, Sleep, (UNISOM) 25 MG tablet Take 25 mg by mouth at bedtime as needed.    . Flaxseed, Linseed, (FLAX SEEDS PO) Take by mouth.    . folic acid (FOLVITE) 1 MG tablet Take 1 mg by mouth.    . gabapentin (NEURONTIN) 100 MG capsule Take 100 mg by mouth 2 (two) times daily.      . Glucosamine-Chondroitin (MOVE FREE PO) Take by mouth.    [name] ibuprofen (ADVIL,MOTRIN) 800 MG tablet Take by mouth.    [name] MACA ROOT PO Take by mouth.  Takes 2    . methotrexate (RHEUMATREX) 2.5 MG tablet Take 2.5 mg by mouth.     . Misc Natural Products (OSTEO BI-FLEX JOINT SHIELD PO) Take by mouth. 2 times daily    . Multiple Vitamins-Minerals (PRESERVISION AREDS 2 PO) Take 2 tablets by mouth daily.    . ranitidine (ZANTAC) 150 MG capsule Take 150 mg by mouth.    . venlafaxine XR (EFFEXOR-XR) 75 MG 24 hr capsule Take 1 capsule (75 mg total) by mouth daily. 90 capsule 0   No current facility-administered medications for this visit.     Family History  Problem Relation Age of Onset  . Breast cancer Mother 68  . Lupus Sister        pt's twin  . Pancreatic cancer Father   . Cancer - Other Brother        oral cancer- resolved  . Dementia Brother   .  Heart failure Maternal Grandmother   . Heart failure Maternal Grandfather   . Diabetes Paternal Grandmother   . Heart failure Paternal Grandfather   . Rheum arthritis Sister     ROS:  Pertinent items are noted in HPI.  Otherwise, a comprehensive ROS was negative.  Exam:   BP 118/82   Pulse 76   Resp 16   Ht 5' 3.25" (1.607 m)   Wt 128 lb (58.1 kg)   LMP 02/05/2002 (Within Weeks)   BMI 22.50 kg/m  Height: 5' 3.25" (160.7 cm) Ht Readings from Last 3 Encounters:  05/06/17 5' 3.25" (1.607 m)  11/26/16 '5\' 3"'  (1.6 m)  04/13/16 '5\' 3"'  (1.6 m)    General appearance: alert, cooperative and appears stated age Head: Normocephalic, without obvious abnormality, atraumatic Neck: no adenopathy, supple, symmetrical, trachea midline and thyroid normal to inspection and palpation Lungs: clear to auscultation bilaterally Breasts: normal appearance, no masses or tenderness, No nipple retraction or dimpling, No nipple discharge or bleeding, No axillary or supraclavicular adenopathy, right breast, augmentation on left with saline implants, feel intact, saline implant on right from mastectomy, no masses noted, implants appear intact Heart: regular rate and rhythm Abdomen: soft, non-tender; no masses,  no organomegaly Extremities: extremities normal, atraumatic, no cyanosis or edema Skin: Skin color, texture, turgor normal. No rashes or lesions Lymph nodes: Cervical, supraclavicular, and axillary nodes normal. No abnormal inguinal nodes palpated Neurologic: Grossly normal   Pelvic: External genitalia:  no lesions              Urethra:  normal appearing urethra with no masses, tenderness or lesions              Bartholin's and Skene's: normal                 Vagina: normal appearing vagina with normal color and discharge, no lesions              Cervix: no cervical motion tenderness, no lesions and posterior              Pap taken: Yes.   Bimanual Exam:  Uterus:  normal size, contour, position,  consistency, mobility, non-tender              Adnexa: normal adnexa and no mass, fullness, tenderness               Rectovaginal: Confirms               Anus:  normal sphincter tone, no lesions  Chaperone present: yes  A:  Well Woman  with normal exam  Menopausal no HRT  Atrophic vaginitis using OTC cream for dryness  Follow up ASCUS pap - HPVHR today  RA under rheumatology care  Right breast cancer history under Oncology now, off Tamoxifen now  P:   Reviewed health and wellness pertinent to exam  Aware of need to advise if vaginal bleeding  Continue cream use as needed  Will do follow up as indicated  Continue follow up with MD as indicated  Pap smear: yes  counseled on breast self exam, mammography screening, menopause, adequate intake of calcium and vitamin D, diet and exercise, Kegel's exercises  return annually or prn  An After Visit Summary was printed and given to the patient.

## 2017-05-11 LAB — CYTOLOGY - PAP: Diagnosis: NEGATIVE

## 2017-05-31 ENCOUNTER — Other Ambulatory Visit: Payer: Self-pay | Admitting: Rheumatology

## 2017-06-11 ENCOUNTER — Telehealth: Payer: Self-pay | Admitting: Rheumatology

## 2017-06-11 NOTE — Telephone Encounter (Signed)
Patient calling in reference to rx for Gabapentin several years ago. Patient would like someone to call her back to discuss why we cannot renew this rx for her.

## 2017-06-15 NOTE — Telephone Encounter (Signed)
Patient advised that she needs an appointment in the office before her medication can be refilled. Patient has been scheduled for 06/17/17 at 3:00 pm/

## 2017-06-17 ENCOUNTER — Ambulatory Visit (INDEPENDENT_AMBULATORY_CARE_PROVIDER_SITE_OTHER): Payer: PPO

## 2017-06-17 ENCOUNTER — Ambulatory Visit (INDEPENDENT_AMBULATORY_CARE_PROVIDER_SITE_OTHER): Payer: Self-pay

## 2017-06-17 ENCOUNTER — Ambulatory Visit (INDEPENDENT_AMBULATORY_CARE_PROVIDER_SITE_OTHER): Payer: PPO | Admitting: Rheumatology

## 2017-06-17 ENCOUNTER — Encounter: Payer: Self-pay | Admitting: Rheumatology

## 2017-06-17 VITALS — BP 131/84 | HR 96 | Resp 12 | Ht 63.5 in | Wt 118.0 lb

## 2017-06-17 DIAGNOSIS — M170 Bilateral primary osteoarthritis of knee: Secondary | ICD-10-CM

## 2017-06-17 DIAGNOSIS — M19041 Primary osteoarthritis, right hand: Secondary | ICD-10-CM

## 2017-06-17 DIAGNOSIS — M19079 Primary osteoarthritis, unspecified ankle and foot: Secondary | ICD-10-CM

## 2017-06-17 DIAGNOSIS — M1990 Unspecified osteoarthritis, unspecified site: Secondary | ICD-10-CM | POA: Diagnosis not present

## 2017-06-17 DIAGNOSIS — L408 Other psoriasis: Secondary | ICD-10-CM

## 2017-06-17 DIAGNOSIS — M19042 Primary osteoarthritis, left hand: Secondary | ICD-10-CM | POA: Diagnosis not present

## 2017-06-17 MED ORDER — GABAPENTIN 100 MG PO CAPS: 100.0000 mg | ORAL_CAPSULE | Freq: Two times a day (BID) | ORAL | 1 refills | Status: DC

## 2017-06-17 NOTE — Progress Notes (Signed)
Office Visit Note  Patient: [name]             Date of Birth: 1945-11-18           MRN: [id number]             PCP: Patient, No Pcp Per Referring: No ref. provider found Visit Date: 06/17/2017 Occupation: '@GUAROCC' @    Subjective:  Rash and joint pain.   History of Present Illness: [name] is a 71 y.o. 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 In addition, patient also had fissures and cracksin most of the digits of her hands. She had seen dermatologist on 2 different occasions but there is no improvement with this treatment. We advise her Aquaphor, thin layer of Vaseline applied to hands at night and wearing cotton gloves at night and second opinion from another dermatologist.  Patient has a history of osteopenia and she gets [place] office.  Today, patient states the hand fissures were due to psoriasis and has been taking mtx for it. Current dose is 2 pills / week. Was on 4 pills per week x 3 weeks Then 3 pills once a week for 3 weeks Then 2 pills once a week for 3 weeks Then 1 pill once a week for 3 weeks (then, she flared and then went up to 2 pills per week). The methotrexate taper was prescribed by Dr. Sharol Roussel, dermatologist at [facility]. He is monitoring her blood work. She recently had labs done at "lab near His office" in August 2018 but we do not have copy of those labs. Patient will get [place] a copy of those labs    Activities of Daily Living:  Patient reports morning stiffness for 15 minutes.   Patient Reports nocturnal pain.  Difficulty dressing/grooming: Reports Difficulty climbing stairs: Denies Difficulty getting out of chair: Denies Difficulty using hands for taps, buttons, cutlery, and/or writing: Reports   Review of Systems  Constitutional: Negative for fatigue.  HENT: Negative for mouth sores and mouth dryness.   Eyes: Negative for dryness.    Respiratory: Negative for shortness of breath.   Gastrointestinal: Negative for constipation and diarrhea.  Musculoskeletal: Negative for myalgias and myalgias.  Skin: Negative for sensitivity to sunlight.  Psychiatric/Behavioral: Negative for decreased concentration and sleep disturbance.    PMFS History:  Patient Active Problem List   Diagnosis Date Noted  . Psoriasis 01/18/2017  . Breast cancer ([place]) 08/07/2012  . Macular degeneration 08/07/2012  . Breast cancer of upper-outer quadrant of right female breast ([place]) 03/23/2011    Past Medical History:  Diagnosis Date  . Arthritis   . Breast cancer ([place]) 2011   right invasive cancer  . Colon polyps 2001  . Complex endometrial hyperplasia without atypia 2000  . Hearing loss 2005   Right ear, 60%  . Herniated disc 2003   L5-SI  . Hormone replacement therapy (postmenopausal)    2001- DC 2012 with cancer diagnosis  . Menopause   . Osteoporosis   . Pap smear abnormality of cervix with ASCUS favoring benign 03/2015   HR HPV negative  . Psoriasis   . S/P breast augmentation 1987    Family History  Problem Relation Age of Onset  . Breast cancer Mother 6  . Lupus Sister        pt's twin  . Pancreatic cancer Father   . Cancer - Other Brother  oral cancer- resolved  . Dementia Brother   . Heart failure Maternal Grandmother   . Heart failure Maternal Grandfather   . Diabetes Paternal Grandmother   . Heart failure Paternal Grandfather   . Rheum arthritis Sister    Past Surgical History:  Procedure Laterality Date  . APPENDECTOMY  1983   LSO- Serous cystoadenoma  . BREAST SURGERY Bilateral 1987   bilateral breast augmentation  . BREAST SURGERY  03/13/11   right mastectomy-sln,ERPR+,Her2-, Reconstruction  . left index finger fusion     10/07; 3/08  . SALPINGECTOMY Right 1970's   Ectopic  . TUBAL LIGATION     Social History   Social History Narrative  . No narrative on file     Objective: Vital Signs: BP  131/84   Pulse 96   Resp 12   Ht 5' 3.5" (1.613 m)   Wt 118 lb (53.5 kg)   LMP 02/05/2002 (Within Weeks)   BMI 20.57 kg/m    Physical Exam  Constitutional: She is oriented to person, place, and time. She appears well-developed and well-nourished.  HENT:  Head: Normocephalic and atraumatic.  Eyes: Pupils are equal, round, and reactive to light. EOM are normal.  Cardiovascular: Normal rate, regular rhythm and normal heart sounds.  Exam reveals no gallop and no friction rub.   No murmur heard. Pulmonary/Chest: Effort normal and breath sounds normal. She has no wheezes. She has no rales.  Abdominal: Soft. Bowel sounds are normal. She exhibits no distension. There is no tenderness. There is no guarding. No hernia.  Musculoskeletal: Normal range of motion. She exhibits no edema, tenderness or deformity.  Lymphadenopathy:    She has no cervical adenopathy.  Neurological: She is alert and oriented to person, place, and time. Coordination normal.  Skin: Skin is warm and dry. Capillary refill takes less than 2 seconds. No rash noted.  Psychiatric: She has a normal mood and affect. Her behavior is normal.  Nursing note and vitals reviewed.    Musculoskeletal Exam:  Full range of motion of all joints except decreased fist formation of bilateral hands secondary to angulations at the DIP and PIPs Fibromyalgia tender points are all absent  CDAI Exam: No CDAI exam completed.  No synovitis on examination.   Investigation: No additional findings. Office Visit on 05/06/2017  Component Date Value Ref Range Status  . Adequacy 05/06/2017 Satisfactory for evaluation  endocervical/transformation zone component PRESENT.   Final  . Diagnosis 05/06/2017 NEGATIVE FOR INTRAEPITHELIAL LESIONS OR MALIGNANCY.   Final  . Material Submitted 05/06/2017 CervicoVaginal Pap [ThinPrep Imaged]   Final  . CYTOLOGY - PAP 05/06/2017 PAP RESULT   Final-Edited  Appointment on 11/19/2016  Component Date Value Ref  Range Status  . WBC 11/19/2016 7.1  3.9 - 10.3 10e3/uL Final  . NEUT# 11/19/2016 4.1  1.5 - 6.5 10e3/uL Final  . HGB 11/19/2016 14.6  11.6 - 15.9 g/dL Final  . HCT 11/19/2016 44.2  34.8 - 46.6 % Final  . Platelets 11/19/2016 319  145 - 400 10e3/uL Final  . MCV 11/19/2016 96.9  79.5 - 101.0 fL Final  . MCH 11/19/2016 32.0  25.1 - 34.0 pg Final  . MCHC 11/19/2016 33.0  31.5 - 36.0 g/dL Final  . RBC 11/19/2016 4.56  3.70 - 5.45 10e6/uL Final  . RDW 11/19/2016 14.8* 11.2 - 14.5 % Final  . lymph# 11/19/2016 2.0  0.9 - 3.3 10e3/uL Final  . MONO# 11/19/2016 0.8  0.1 - 0.9 10e3/uL Final  . Eosinophils Absolute  11/19/2016 0.2  0.0 - 0.5 10e3/uL Final  . Basophils Absolute 11/19/2016 0.1  0.0 - 0.1 10e3/uL Final  . NEUT% 11/19/2016 57.0  38.4 - 76.8 % Final  . LYMPH% 11/19/2016 28.6  14.0 - 49.7 % Final  . MONO% 11/19/2016 11.5  0.0 - 14.0 % Final  . EOS% 11/19/2016 2.2  0.0 - 7.0 % Final  . BASO% 11/19/2016 0.7  0.0 - 2.0 % Final  . Sodium 11/19/2016 144  136 - 145 mEq/L Final  . Potassium 11/19/2016 5.2* 3.5 - 5.1 mEq/L Final  . Chloride 11/19/2016 107  98 - 109 mEq/L Final  . CO2 11/19/2016 28  22 - 29 mEq/L Final  . Glucose 11/19/2016 74  70 - 140 mg/dl Final   Glucose reference range is for nonfasting patients. Fasting glucose reference range is 70- 100.  [name] BUN 11/19/2016 26.4* 7.0 - 26.0 mg/dL Final  . Creatinine 11/19/2016 1.2* 0.6 - 1.1 mg/dL Final  . Total Bilirubin 11/19/2016 0.35  0.20 - 1.20 mg/dL Final  . Alkaline Phosphatase 11/19/2016 84  40 - 150 U/L Final  . AST 11/19/2016 17  5 - 34 U/L Final  . ALT 11/19/2016 15  0 - 55 U/L Final  . Total Protein 11/19/2016 6.8  6.4 - 8.3 g/dL Final  . Albumin 11/19/2016 4.0  3.5 - 5.0 g/dL Final  . Calcium 11/19/2016 9.8  8.4 - 10.4 mg/dL Final  . Anion Gap 11/19/2016 8  3 - 11 mEq/L Final  . EGFR 11/19/2016 46* >90 ml/min/1.73 m2 Final   eGFR is calculated using the CKD-EPI Creatinine Equation (2009)     Imaging: No results  found.         Pt seen dermatologist And diagnosed with psoriasis. And tx'd with "tapered dose of mtx". See dr. Yancey Flemings notes in epic for full details.   Speciality Comments: No specialty comments available.    Procedures:  No procedures performed Allergies: Dilaudid [hydromorphone hcl] and Sulfa antibiotics   Assessment / Plan:     Visit Diagnoses: Primary osteoarthritis of both hands - Plan: XR Hand 2 View Right, XR Hand 2 View Left  Primary osteoarthritis of both knees  Osteoarthritis of ankle and foot, unspecified laterality  Other psoriasis - Plan: XR Hand 2 View Right, XR Hand 2 View Left   Plan: #1: New diagnosis of psoriasis. Please see Epic for visits to the dermatologist at [place] (Dr. Sharol Roussel) He treated the patient with tapered dose of methotrexate Please see his note for full details  #2 high risk prescription Pt seen dermatologist And diagnosed with psoriasis. And tx'd with "tapered dose of mtx". See dr. Yancey Flemings notes in epic for full details. Methotrexate 2.5 mg; 4 pills once a week for 3 weeks; 3 pills once a week for 3 weeks; 2 pills once a week for 3 weeks; one pill once a week for 3 weeks; Patient flared when she went down to one pill of methotrexate and therefore went back up to 2 pills of methotrexate couple weeks ago. She is coordinating her methotrexate care through dermatologist office. He is monitoring her blood work.  #3: OA of bilateral hands Angulations at DIPs and PIPs. No synovitis noted on exam No nail dystrophy  #4: Please review May 2012 rheumatology visit note in [facility]. In summary, during that visit, we noticed nail dystrophy. Lab work was ordered to rule out psoriatic arthritIS RHEUMAatoid factor tive, sedimenormal at 1, CCP WAS NEGATIVE  #5: Patient would like to  get an ultrasound done per our recommendation as soon as possible to see if any synovitis is present when she tapers down from her methotrexate.  Patient usually flarwith her joint pain with one pill of methotrexate per week. Currently she is on 2 pills of methotrexate.  Our appointments are filled  up until March 2019. A follow-up appointment can be delayed until February or March. However, Dr. Estanislado Pandy recommended that patient be worked in the next few weeks for ultrasound. Ivin Booty will be consulted and asked to see when the patient can be worked in for ultrasound.  #6: Patient will continue to get care from Dr. Yancey Flemings office for methotrexate and lab work. We will delay doing autoimmune workup until after the ultrasound is done.   Orders: Orders Placed This Encounter  Procedures  . XR Hand 2 View Right  . XR Hand 2 View Left   Meds ordered this encounter  Medications  . gabapentin (NEURONTIN) 100 MG capsule    Sig: Take 1 capsule (100 mg total) by mouth 2 (two) times daily.    Dispense:  180 capsule    Refill:  1    Order Specific Question:   Supervising Provider    Answer:   Bo Merino [phone]    Face-to-face time spent with patient was 30 minutes. 50% of time was spent in counseling and coordination of care.  Follow-Up Instructions: Return in about 3 months (around 09/17/2017) for ps hands, r/o PsA, oa hands.   Eliezer Lofts, PA-C I examined and evaluated the patient with Eliezer Lofts PA. Patient has thickening of PIP/DIP joints but no active synovitis. He'll schedule ultrasound to evaluate for underlying synovitis. The plan of care was discussed as noted above.  Bo Merino, MD Note - This record has been created using Editor, commissioning.  Chart creation errors have been sought, but may not always  have been located. Such creation errors do not reflect on  the standard of medical care.

## 2017-07-13 NOTE — Progress Notes (Signed)
    Ultrasound examination of bilateral hands was performed per EULAR recommendations. Using 12 MHz transducer, grayscale and power Doppler bilateral second, third,fourth and fifth PIP and DIP joints and bilateral wrist joints both dorsal and volar aspects were evaluated to look for synovitis or tenosynovitis. The findings were there was severe narrowing of all PIP and DIP joints.no synovitis or tenosynovitis on ultrasound examination.there was mild hyperemia of the right third PIP joint on volar aspect. Right median nerve was 0.08 cm squares which was within normal limits and left median nerve was 0.07 cm squares, bifid which was within normal limits.  Impression: Ultrasound examination was consistent with severe osteoarthritis.with mild inflammatory component in the right third PIP joint. These findings can be seen and psoriatic arthritis as well but patient did not have any synovitis on examination. 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 Bo Merino, MD

## 2017-07-14 ENCOUNTER — Other Ambulatory Visit: Payer: Self-pay | Admitting: Obstetrics & Gynecology

## 2017-07-14 ENCOUNTER — Inpatient Hospital Stay (INDEPENDENT_AMBULATORY_CARE_PROVIDER_SITE_OTHER): Payer: Self-pay

## 2017-07-14 ENCOUNTER — Ambulatory Visit (INDEPENDENT_AMBULATORY_CARE_PROVIDER_SITE_OTHER): Payer: PPO | Admitting: Rheumatology

## 2017-07-14 DIAGNOSIS — M79642 Pain in left hand: Secondary | ICD-10-CM | POA: Diagnosis not present

## 2017-07-14 DIAGNOSIS — M79641 Pain in right hand: Secondary | ICD-10-CM

## 2017-07-14 NOTE — Telephone Encounter (Signed)
Medication refill request: Effexor  Last AEX:  05-06-17  Next AEX: 05-10-18  Last MMG (if hormonal medication request): 11-11-16 WNL  Refill authorized: please advise

## 2017-08-03 DIAGNOSIS — H353131 Nonexudative age-related macular degeneration, bilateral, early dry stage: Secondary | ICD-10-CM | POA: Diagnosis not present

## 2017-09-13 DIAGNOSIS — H903 Sensorineural hearing loss, bilateral: Secondary | ICD-10-CM | POA: Diagnosis not present

## 2017-09-13 DIAGNOSIS — H6122 Impacted cerumen, left ear: Secondary | ICD-10-CM | POA: Diagnosis not present

## 2017-10-13 ENCOUNTER — Other Ambulatory Visit: Payer: Self-pay | Admitting: Certified Nurse Midwife

## 2017-10-13 NOTE — Telephone Encounter (Signed)
Medication refill request: Effexor  Last AEX:  05-06-17  Next AEX: 05-10-18  Last MMG (if hormonal medication request): 11-12-16 WNL  Refill authorized: please advise

## 2017-10-14 NOTE — Telephone Encounter (Signed)
Patient need phone call due to being on other medications ie Lamictal and needs to be managed by MD

## 2017-10-15 NOTE — Telephone Encounter (Signed)
Will renew

## 2017-10-15 NOTE — Telephone Encounter (Signed)
Left message to call regarding refill request -eh

## 2017-10-15 NOTE — Telephone Encounter (Signed)
Spoke with patient and she is not taking Lamictal. She says she has never heard of it. She is only taking the Effexor. Please advise

## 2017-10-15 NOTE — Telephone Encounter (Signed)
Returning a call to [place].

## 2017-10-20 NOTE — Progress Notes (Signed)
Office Visit Note  Patient: [name]             Date of Birth: 1946/03/21           MRN: [id number]             PCP: Patient, No Pcp Per Referring: No ref. provider found Visit Date: 11/03/2017 Occupation: '@GUAROCC' @    Subjective; Buttock pain   History of Present Illness: [name] is a 72 y.o. female history of osteoarthritis, DDD.  Patient states for the last few weeks she has been having difficulty walking, climbing stairs and sitting due to pain in her buttock area.  She continues to have some stiffness in her hands knee joints and feet.  Is been also having discomfort in her upper and lower back.  Has been taking methotrexate for psoriasis at low-dose.  Activities of Daily Living:  Patient reports morning stiffness for 0 minute.   Patient Reports nocturnal pain.  Difficulty dressing/grooming: Denies Difficulty climbing stairs: Reports Difficulty getting out of chair: Denies Difficulty using hands for taps, buttons, cutlery, and/or writing: Denies   Review of Systems  Constitutional: Negative for fatigue, night sweats, weight gain, weight loss and weakness.  HENT: Negative for mouth sores, trouble swallowing, trouble swallowing, mouth dryness and nose dryness.   Eyes: Negative for pain, redness, visual disturbance and dryness.  Respiratory: Negative for cough, shortness of breath and difficulty breathing.   Cardiovascular: Negative for chest pain, palpitations, hypertension, irregular heartbeat and swelling in legs/feet.  Gastrointestinal: Negative for blood in stool, constipation and diarrhea.  Endocrine: Negative for increased urination.  Genitourinary: Negative for vaginal dryness.  Musculoskeletal: Positive for arthralgias, joint pain, myalgias and myalgias. Negative for joint swelling, muscle weakness, morning stiffness and muscle tenderness.  Skin: Positive for rash. Negative for color change, hair loss, skin tightness, ulcers and sensitivity to  sunlight.       Psoriasis  Allergic/Immunologic: Negative for susceptible to infections.  Neurological: Negative for dizziness, memory loss and night sweats.  Hematological: Negative for swollen glands.  Psychiatric/Behavioral: Negative for depressed mood and sleep disturbance. The patient is not nervous/anxious.     PMFS History:  Patient Active Problem List   Diagnosis Date Noted  . Psoriasis 01/18/2017  . Breast cancer ([place]) 08/07/2012  . Macular degeneration 08/07/2012  . Breast cancer of upper-outer quadrant of right female breast ([place]) 03/23/2011    Past Medical History:  Diagnosis Date  . Arthritis   . Breast cancer ([place]) 2011   right invasive cancer  . Colon polyps 2001  . Complex endometrial hyperplasia without atypia 2000  . Hearing loss 2005   Right ear, 60%  . Herniated disc 2003   L5-SI  . Hormone replacement therapy (postmenopausal)    2001- DC 2012 with cancer diagnosis  . Menopause   . Osteoporosis   . Pap smear abnormality of cervix with ASCUS favoring benign 03/2015   HR HPV negative  . Psoriasis   . S/P breast augmentation 1987    Family History  Problem Relation Age of Onset  . Breast cancer Mother 18  . Lupus Sister        pt's twin  . Pancreatic cancer Father   . Cancer - Other Brother        oral cancer- resolved  . Dementia Brother   . Heart failure Maternal Grandmother   . Heart failure Maternal Grandfather   . Diabetes Paternal Grandmother   . Heart failure Paternal Grandfather   .  Rheum arthritis Sister    Past Surgical History:  Procedure Laterality Date  . APPENDECTOMY  1983   LSO- Serous cystoadenoma  . BREAST SURGERY Bilateral 1987   bilateral breast augmentation  . BREAST SURGERY  03/13/11   right mastectomy-sln,ERPR+,Her2-, Reconstruction  . left index finger fusion     10/07; 3/08  . SALPINGECTOMY Right 1970's   Ectopic  . TUBAL LIGATION     Social History   Social History Narrative  . Not on file      Objective: Vital Signs: BP 116/85 (BP Location: Left Arm, Patient Position: Sitting, Cuff Size: Normal)   Pulse 90   Resp 15   Ht '5\' 3"'  (1.6 m)   Wt 136 lb (61.7 kg)   LMP 02/05/2002 (Within Weeks)   BMI 24.09 kg/m    Physical Exam  Constitutional: She is oriented to person, place, and time. She appears well-developed and well-nourished.  HENT:  Head: Normocephalic and atraumatic.  Eyes: Conjunctivae and EOM are normal.  Neck: Normal range of motion.  Cardiovascular: Normal rate, regular rhythm, normal heart sounds and intact distal pulses.  Pulmonary/Chest: Effort normal and breath sounds normal.  Abdominal: Soft. Bowel sounds are normal.  Lymphadenopathy:    She has no cervical adenopathy.  Neurological: She is alert and oriented to person, place, and time.  Skin: Skin is warm and dry. Capillary refill takes less than 2 seconds.  Psychiatric: She has a normal mood and affect. Her behavior is normal.  Nursing note and vitals reviewed.    Musculoskeletal Exam: C-spine thoracic lumbar spine some limitation with range of motion.  Shoulder joints elbow joints wrist joint MCPs PIPs DIPs with good range of motion.  She has mild inflammation over her PIP joints.  She also have limited range of motion of her DIPs.  Joints knee joints ankles MTPs PIPs with good range of motion with no synovitis.  CDAI Exam: No CDAI exam completed.    Investigation: No additional findings. CBC Latest Ref Rng & Units 11/19/2016 11/21/2015 04/09/2015  WBC 3.9 - 10.3 10e3/uL 7.1 6.9 -  Hemoglobin 11.6 - 15.9 g/dL 14.6 14.9 14.1  Hematocrit 34.8 - 46.6 % 44.2 45.8 -  Platelets 145 - 400 10e3/uL 319 308 -   CMP Latest Ref Rng & Units 11/19/2016 11/21/2015 11/21/2014  Glucose 70 - 140 mg/dl 74 86 92  BUN 7.0 - 26.0 mg/dL 26.4(H) 23.0 14.9  Creatinine 0.6 - 1.1 mg/dL 1.2(H) 1.0 0.9  Sodium 136 - 145 mEq/L 144 143 143  Potassium 3.5 - 5.1 mEq/L 5.2(H) 4.4 4.2  Chloride 98 - 107 mEq/L - - -  CO2 22 -  29 mEq/L '28 28 26  ' Calcium 8.4 - 10.4 mg/dL 9.8 9.1 8.4  Total Protein 6.4 - 8.3 g/dL 6.8 6.8 6.2(L)  Total Bilirubin 0.20 - 1.20 mg/dL 0.35 0.42 0.29  Alkaline Phos 40 - 150 U/L 84 81 94  AST 5 - 34 U/L '17 14 16  ' ALT 0 - 55 U/L '15 9 11    ' Imaging: No results found.  Speciality Comments: No specialty comments available.    Procedures:  No procedures performed Allergies: Dilaudid [hydromorphone hcl] and Sulfa antibiotics   Assessment / Plan:     Visit Diagnoses: Ischial bursitis, unspecified laterality: She has been having tenderness over bilateral ischial bursa.  I have advised her to sit on the padded seat.  Some stretching exercises were discussed.  I have also referred her to physical therapy for this ischial bursitis.  Cost option of cortisone injection in case her symptoms do not improve.  Primary osteoarthritis of both hands: She continues to have a stiffness and pain in her bilateral hands with some inflammatory changes in her PIP joints.  Primary osteoarthritis of both knees: She has discomfort in her bilateral knee joints but is tolerable currently.  Primary osteoarthritis of both feet  High risk medication use - MTX 1 tablet p.o. weekly by her dermatologist. folic acid which she takes for psoriasis.  DDD (degenerative disc disease), cervical: Chronic pain  DDD (degenerative disc disease), lumbar: Chronic pain.  She was taking Neurontin more for insomnia than pain now.  I have discouraged the use of Neurontin due to risk of osteoporosis.  She will taper off Neurontin.  Age-related osteoporosis without current pathological fracture: Followed up by her PCP.  Psoriasis: Followed by dermatologist.  History of breast cancer  History of depression  History of gastroesophageal reflux (GERD)  History of macular degeneration    Orders: No orders of the defined types were placed in this encounter.  No orders of the defined types were placed in this  encounter.   Face-to-face time spent with patient was 30 minutes. Greater than 50% of time was spent in counseling and coordination of care.  Follow-Up Instructions: Return in about 6 months (around 05/03/2018).   Bo Merino, MD  Note - This record has been created using Editor, commissioning.  Chart creation errors have been sought, but may not always  have been located. Such creation errors do not reflect on  the standard of medical care.

## 2017-11-03 ENCOUNTER — Encounter: Payer: Self-pay | Admitting: Rheumatology

## 2017-11-03 ENCOUNTER — Ambulatory Visit: Payer: PPO | Admitting: Rheumatology

## 2017-11-03 VITALS — BP 116/85 | HR 90 | Resp 15 | Ht 63.0 in | Wt 136.0 lb

## 2017-11-03 DIAGNOSIS — Z853 Personal history of malignant neoplasm of breast: Secondary | ICD-10-CM

## 2017-11-03 DIAGNOSIS — M19041 Primary osteoarthritis, right hand: Secondary | ICD-10-CM

## 2017-11-03 DIAGNOSIS — M7070 Other bursitis of hip, unspecified hip: Secondary | ICD-10-CM | POA: Diagnosis not present

## 2017-11-03 DIAGNOSIS — M170 Bilateral primary osteoarthritis of knee: Secondary | ICD-10-CM | POA: Diagnosis not present

## 2017-11-03 DIAGNOSIS — M19071 Primary osteoarthritis, right ankle and foot: Secondary | ICD-10-CM

## 2017-11-03 DIAGNOSIS — Z8659 Personal history of other mental and behavioral disorders: Secondary | ICD-10-CM | POA: Diagnosis not present

## 2017-11-03 DIAGNOSIS — M5030 Other cervical disc degeneration, unspecified cervical region: Secondary | ICD-10-CM | POA: Diagnosis not present

## 2017-11-03 DIAGNOSIS — L409 Psoriasis, unspecified: Secondary | ICD-10-CM

## 2017-11-03 DIAGNOSIS — M19072 Primary osteoarthritis, left ankle and foot: Secondary | ICD-10-CM

## 2017-11-03 DIAGNOSIS — M19042 Primary osteoarthritis, left hand: Secondary | ICD-10-CM

## 2017-11-03 DIAGNOSIS — Z79899 Other long term (current) drug therapy: Secondary | ICD-10-CM | POA: Diagnosis not present

## 2017-11-03 DIAGNOSIS — Z8719 Personal history of other diseases of the digestive system: Secondary | ICD-10-CM | POA: Diagnosis not present

## 2017-11-03 DIAGNOSIS — M810 Age-related osteoporosis without current pathological fracture: Secondary | ICD-10-CM | POA: Diagnosis not present

## 2017-11-03 DIAGNOSIS — M5136 Other intervertebral disc degeneration, lumbar region: Secondary | ICD-10-CM

## 2017-11-03 DIAGNOSIS — Z8669 Personal history of other diseases of the nervous system and sense organs: Secondary | ICD-10-CM

## 2017-12-01 DIAGNOSIS — M1990 Unspecified osteoarthritis, unspecified site: Secondary | ICD-10-CM | POA: Diagnosis not present

## 2017-12-01 DIAGNOSIS — L409 Psoriasis, unspecified: Secondary | ICD-10-CM | POA: Diagnosis not present

## 2017-12-01 DIAGNOSIS — L821 Other seborrheic keratosis: Secondary | ICD-10-CM | POA: Diagnosis not present

## 2017-12-02 DIAGNOSIS — H6122 Impacted cerumen, left ear: Secondary | ICD-10-CM | POA: Diagnosis not present

## 2017-12-02 DIAGNOSIS — H903 Sensorineural hearing loss, bilateral: Secondary | ICD-10-CM | POA: Diagnosis not present

## 2017-12-13 ENCOUNTER — Other Ambulatory Visit: Payer: Self-pay | Admitting: Nurse Practitioner

## 2017-12-13 DIAGNOSIS — Z1231 Encounter for screening mammogram for malignant neoplasm of breast: Secondary | ICD-10-CM

## 2018-01-05 ENCOUNTER — Other Ambulatory Visit: Payer: Self-pay | Admitting: Radiology

## 2018-01-05 ENCOUNTER — Ambulatory Visit
Admission: RE | Admit: 2018-01-05 | Discharge: 2018-01-05 | Disposition: A | Discharge status: Home / Self Care | Payer: PPO | Source: Ambulatory Visit | Attending: Nurse Practitioner | Admitting: Nurse Practitioner

## 2018-01-05 DIAGNOSIS — Z1231 Encounter for screening mammogram for malignant neoplasm of breast: Secondary | ICD-10-CM

## 2018-01-05 HISTORY — DX: Personal history of antineoplastic chemotherapy: Z92.21

## 2018-01-10 ENCOUNTER — Other Ambulatory Visit: Payer: Self-pay | Admitting: Certified Nurse Midwife

## 2018-01-10 NOTE — Telephone Encounter (Signed)
Medication refill request: effexor-XR Last AEX:  05/06/17 DL  Next AEX: 05/10/18  Last MMG (if hormonal medication request): 01/06/18 BIRADS 1 negative  Refill authorized: 10/15/17 #90, 0RF. Today, please advise.

## 2018-04-05 DIAGNOSIS — H43813 Vitreous degeneration, bilateral: Secondary | ICD-10-CM | POA: Diagnosis not present

## 2018-04-05 DIAGNOSIS — H353131 Nonexudative age-related macular degeneration, bilateral, early dry stage: Secondary | ICD-10-CM | POA: Diagnosis not present

## 2018-04-05 DIAGNOSIS — H2513 Age-related nuclear cataract, bilateral: Secondary | ICD-10-CM | POA: Diagnosis not present

## 2018-04-05 DIAGNOSIS — H3540 Unspecified peripheral retinal degeneration: Secondary | ICD-10-CM | POA: Diagnosis not present

## 2018-04-25 NOTE — Progress Notes (Deleted)
 Office Visit Note  Patient: [name]             Date of Birth: 02/27/1946           MRN: [id number]             PCP: Patient, No Pcp Per Referring: No ref. provider found Visit Date: 05/03/2018 Occupation: @GUAROCC@  Subjective:  No chief complaint on file.   History of Present Illness: [name] is a 72 y.o. female ***   Activities of Daily Living:  Patient reports morning stiffness for *** {minute/hour:19697}.   Patient {ACTIONS;DENIES/REPORTS:21021675::"Denies"} nocturnal pain.  Difficulty dressing/grooming: {ACTIONS;DENIES/REPORTS:21021675::"Denies"} Difficulty climbing stairs: {ACTIONS;DENIES/REPORTS:21021675::"Denies"} Difficulty getting out of chair: {ACTIONS;DENIES/REPORTS:21021675::"Denies"} Difficulty using hands for taps, buttons, cutlery, and/or writing: {ACTIONS;DENIES/REPORTS:21021675::"Denies"}  No Rheumatology ROS completed.   PMFS History:  Patient Active Problem List   Diagnosis Date Noted  . Psoriasis 01/18/2017  . Breast cancer ([facility]) 08/07/2012  . Macular degeneration 08/07/2012  . Breast cancer of upper-outer quadrant of right female breast ([facility]) 03/23/2011    Past Medical History:  Diagnosis Date  . Arthritis   . Breast cancer ([facility]) 2011   right invasive cancer  . Colon polyps 2001  . Complex endometrial hyperplasia without atypia 2000  . Hearing loss 2005   Right ear, 60%  . Herniated disc 2003   L5-SI  . Hormone replacement therapy (postmenopausal)    2001- DC 2012 with cancer diagnosis  . Menopause   . Osteoporosis   . Pap smear abnormality of cervix with ASCUS favoring benign 03/2015   HR HPV negative  . Personal history of chemotherapy   . Psoriasis   . S/P breast augmentation 1987    Family History  Problem Relation Age of Onset  . Breast cancer Mother 60  . Lupus Sister        pt's twin  . Pancreatic cancer Father   . Cancer - Other Brother        oral cancer- resolved  . Dementia Brother   . Heart failure  Maternal Grandmother   . Heart failure Maternal Grandfather   . Diabetes Paternal Grandmother   . Heart failure Paternal Grandfather   . Rheum arthritis Sister    Past Surgical History:  Procedure Laterality Date  . APPENDECTOMY  1983   LSO- Serous cystoadenoma  . BREAST SURGERY Bilateral 1987   bilateral breast augmentation  . BREAST SURGERY  03/13/11   right mastectomy-sln,ERPR+,Her2-, Reconstruction  . left index finger fusion     10/07; 3/08  . MASTECTOMY Right   . SALPINGECTOMY Right 1970's   Ectopic  . TUBAL LIGATION     Social History   Social History Narrative  . Not on file    Objective: Vital Signs: LMP 02/05/2002 (Within Weeks)    Physical Exam   Musculoskeletal Exam: ***  CDAI Exam: CDAI Score: Not documented Patient Global Assessment: Not documented; Provider Global Assessment: Not documented Swollen: Not documented; Tender: Not documented Joint Exam   Not documented   There is currently no information documented on the homunculus. Go to the Rheumatology activity and complete the homunculus joint exam.  Investigation: No additional findings.  Imaging: No results found.  Recent Labs: Lab Results  Component Value Date   WBC 7.1 11/19/2016   HGB 14.6 11/19/2016   PLT 319 11/19/2016   NA 144 11/19/2016   K 5.2 (H) 11/19/2016   CL 104 11/01/2012   CO2 28 11/19/2016   GLUCOSE 74 11/19/2016     BUN 26.4 (H) 11/19/2016   CREATININE 1.2 (H) 11/19/2016   BILITOT 0.35 11/19/2016   ALKPHOS 84 11/19/2016   AST 17 11/19/2016   ALT 15 11/19/2016   PROT 6.8 11/19/2016   ALBUMIN 4.0 11/19/2016   CALCIUM 9.8 11/19/2016   GFRAA >60 02/26/2011    Speciality Comments: No specialty comments available.  Procedures:  No procedures performed Allergies: Dilaudid [hydromorphone hcl] and Sulfa antibiotics   Assessment / Plan:     Visit Diagnoses: No diagnosis found.   Orders: No orders of the defined types were placed in this encounter.  No orders of  the defined types were placed in this encounter.   Face-to-face time spent with patient was *** minutes. Greater than 50% of time was spent in counseling and coordination of care.  Follow-Up Instructions: No follow-ups on file.   Marissa C Graves, CMA  Note - This record has been created using Dragon software.  Chart creation errors have been sought, but may not always  have been located. Such creation errors do not reflect on  the standard of medical care. 

## 2018-04-27 DIAGNOSIS — R0683 Snoring: Secondary | ICD-10-CM | POA: Diagnosis not present

## 2018-04-27 DIAGNOSIS — J040 Acute laryngitis: Secondary | ICD-10-CM | POA: Diagnosis not present

## 2018-04-27 DIAGNOSIS — K210 Gastro-esophageal reflux disease with esophagitis: Secondary | ICD-10-CM | POA: Diagnosis not present

## 2018-04-27 DIAGNOSIS — H6122 Impacted cerumen, left ear: Secondary | ICD-10-CM | POA: Diagnosis not present

## 2018-05-03 ENCOUNTER — Encounter: Payer: PPO | Admitting: Rheumatology

## 2018-05-10 ENCOUNTER — Ambulatory Visit: Payer: PPO | Admitting: Certified Nurse Midwife

## 2018-05-10 NOTE — Progress Notes (Signed)
This encounter was created in error - please disregard.

## 2018-05-16 DIAGNOSIS — H903 Sensorineural hearing loss, bilateral: Secondary | ICD-10-CM | POA: Diagnosis not present

## 2018-05-30 ENCOUNTER — Telehealth: Payer: Self-pay | Admitting: Obstetrics and Gynecology

## 2018-05-30 ENCOUNTER — Ambulatory Visit: Payer: PPO | Admitting: Obstetrics and Gynecology

## 2018-05-30 NOTE — Telephone Encounter (Signed)
Patient's spouse called and cancelled the patient's appointment for an AEX today with Dr. Talbert Nan because the patient is sick and not feeling well. She'll call back to reschedule when she's feeling better.  Routing to provider for FYI only.

## 2018-05-30 NOTE — Progress Notes (Deleted)
72 y.o. G1P0010 Married White or Caucasian Not Hispanic or Latino female here for annual exam.      Patient's last menstrual period was 02/05/2002 (within weeks).          Sexually active: {yes no:314532}  The current method of family planning is {contraception:315051}.    Exercising: {yes no:314532}  {types:19826} Smoker:  {YES P5382123  Health Maintenance: Pap:  05/06/2017 negative History of abnormal Pap:  Yes, ascus MMG:  01/2018 BI-RADS CATEGORY  1: Negative. BMD:   10/24/2015 osteopenic Colonoscopy: Flex Sig 06/10/12, normal; follow with Air Contrast Barium Enema; 07/22/12 BE: Tortuous colon. No persistent polypoid lesion.  TDaP:  2014  Gardasil: n/a   reports that she has been smoking cigarettes. She has been smoking about 0.50 packs per day. She has never used smokeless tobacco. She reports that she drinks about 5.0 standard drinks of alcohol per week. She reports that she does not use drugs.  Past Medical History:  Diagnosis Date  . Arthritis   . Breast cancer ([place]) 2011   right invasive cancer  . Colon polyps 2001  . Complex endometrial hyperplasia without atypia 2000  . Hearing loss 2005   Right ear, 60%  . Herniated disc 2003   L5-SI  . Hormone replacement therapy (postmenopausal)    2001- DC 2012 with cancer diagnosis  . Menopause   . Osteoporosis   . Pap smear abnormality of cervix with ASCUS favoring benign 03/2015   HR HPV negative  . Personal history of chemotherapy   . Psoriasis   . S/P breast augmentation 1987    Past Surgical History:  Procedure Laterality Date  . APPENDECTOMY  1983   LSO- Serous cystoadenoma  . BREAST SURGERY Bilateral 1987   bilateral breast augmentation  . BREAST SURGERY  03/13/11   right mastectomy-sln,ERPR+,Her2-, Reconstruction  . left index finger fusion     10/07; 3/08  . MASTECTOMY Right   . SALPINGECTOMY Right 1970's   Ectopic  . TUBAL LIGATION      Current Outpatient Medications  Medication Sig Dispense Refill   . aspirin 81 MG tablet Take 81 mg by mouth daily.    [name] Bioflavonoid Products (PERIDIN-C PO) Take 2 tablets by mouth 2 (two) times daily.    . calcipotriene-betamethasone (TACLONEX) ointment     . Calcium Carbonate-Vitamin D 600-400 MG-UNIT per chew tablet Chew 1 tablet by mouth daily.    . Cholecalciferol (VITAMIN D-3) 1000 UNITS CAPS Take by mouth.    . clobetasol ointment (TEMOVATE) 0.05 % as needed.     . diclofenac sodium (VOLTAREN) 1 % GEL Apply topically as needed.    . doxylamine, Sleep, (UNISOM) 25 MG tablet Take 25 mg by mouth at bedtime as needed.    . Flaxseed, Linseed, (FLAX SEEDS PO) Take by mouth.    . folic acid (FOLVITE) 1 MG tablet Take 1 mg by mouth.    . gabapentin (NEURONTIN) 100 MG capsule Take 1 capsule (100 mg total) by mouth 2 (two) times daily. 180 capsule 1  . Glucosamine-Chondroitin (MOVE FREE PO) Take by mouth.    [name] ibuprofen (ADVIL,MOTRIN) 800 MG tablet Take by mouth as needed.     [name] MACA ROOT PO Take by mouth. Takes 2    . Misc Natural Products (OSTEO BI-FLEX JOINT SHIELD PO) Take by mouth. 2 times daily    . Multiple Vitamins-Minerals (PRESERVISION AREDS 2 PO) Take 2 tablets by mouth daily.    . ranitidine (ZANTAC) 150 MG capsule Take  150 mg by mouth as needed.     . venlafaxine XR (EFFEXOR-XR) 75 MG 24 hr capsule TAKE 1 CAPSULE(75 MG) BY MOUTH DAILY 90 capsule 1   No current facility-administered medications for this visit.     Family History  Problem Relation Age of Onset  . Breast cancer Mother 22  . Lupus Sister        pt's twin  . Pancreatic cancer Father   . Cancer - Other Brother        oral cancer- resolved  . Dementia Brother   . Heart failure Maternal Grandmother   . Heart failure Maternal Grandfather   . Diabetes Paternal Grandmother   . Heart failure Paternal Grandfather   . Rheum arthritis Sister     Review of Systems  Constitutional: Negative.   HENT: Negative.   Eyes: Negative.   Respiratory: Negative.   Cardiovascular:  Negative.   Gastrointestinal: Negative.   Endocrine: Negative.   Genitourinary: Negative.   Musculoskeletal: Negative.   Skin: Negative.   Allergic/Immunologic: Negative.   Neurological: Negative.   Hematological: Negative.   Psychiatric/Behavioral: Negative.   All other systems reviewed and are negative.   Exam:   LMP 02/05/2002 (Within Weeks)   Weight change: _0 @ Height:      Ht Readings from Last 3 Encounters:  11/03/17 _1  (1.6 m)  06/17/17 5' 3.5" (1.613 m)  05/06/17 5' 3.25" (1.607 m)    General appearance: alert, cooperative and appears stated age Head: Normocephalic, without obvious abnormality, atraumatic Neck: no adenopathy, supple, symmetrical, trachea midline and thyroid {CHL AMB PHY EX THYROID NORM DEFAULT:[phone]::"normal to inspection and palpation"} Lungs: clear to auscultation bilaterally Cardiovascular: regular rate and rhythm Breasts: {Exam; breast:13139::"normal appearance, no masses or tenderness"} Abdomen: soft, non-tender; non distended,  no masses,  no organomegaly Extremities: extremities normal, atraumatic, no cyanosis or edema Skin: Skin color, texture, turgor normal. No rashes or lesions Lymph nodes: Cervical, supraclavicular, and axillary nodes normal. No abnormal inguinal nodes palpated Neurologic: Grossly normal   Pelvic: External genitalia:  no lesions              Urethra:  normal appearing urethra with no masses, tenderness or lesions              Bartholins and Skenes: normal                 Vagina: normal appearing vagina with normal color and discharge, no lesions              Cervix: {CHL AMB PHY EX CERVIX NORM DEFAULT:[phone]::"no lesions"}               Bimanual Exam:  Uterus:  {CHL AMB PHY EX UTERUS NORM DEFAULT:[id number]::"normal size, contour, position, consistency, mobility, non-tender"}              Adnexa: {CHL AMB PHY EX ADNEXA NO MASS DEFAULT:[phone]::"no mass, fullness, tenderness"}                Rectovaginal: Confirms               Anus:  normal sphincter tone, no lesions  Chaperone was present for exam.  A:  Well Woman with normal exam  P:

## 2018-07-08 ENCOUNTER — Other Ambulatory Visit (HOSPITAL_COMMUNITY)
Admission: RE | Admit: 2018-07-08 | Discharge: 2018-07-08 | Disposition: A | Discharge status: Home / Self Care | Payer: PPO | Source: Ambulatory Visit | Attending: Obstetrics & Gynecology | Admitting: Obstetrics & Gynecology

## 2018-07-08 ENCOUNTER — Other Ambulatory Visit: Payer: Self-pay

## 2018-07-08 ENCOUNTER — Ambulatory Visit (INDEPENDENT_AMBULATORY_CARE_PROVIDER_SITE_OTHER): Payer: PPO | Admitting: Certified Nurse Midwife

## 2018-07-08 ENCOUNTER — Encounter: Payer: Self-pay | Admitting: Certified Nurse Midwife

## 2018-07-08 VITALS — BP 104/64 | HR 68 | Resp 16 | Ht 63.25 in | Wt 127.0 lb

## 2018-07-08 DIAGNOSIS — Z01419 Encounter for gynecological examination (general) (routine) without abnormal findings: Secondary | ICD-10-CM | POA: Diagnosis not present

## 2018-07-08 DIAGNOSIS — F419 Anxiety disorder, unspecified: Secondary | ICD-10-CM | POA: Diagnosis not present

## 2018-07-08 DIAGNOSIS — N951 Menopausal and female climacteric states: Secondary | ICD-10-CM

## 2018-07-08 DIAGNOSIS — Z124 Encounter for screening for malignant neoplasm of cervix: Secondary | ICD-10-CM | POA: Insufficient documentation

## 2018-07-08 MED ORDER — VENLAFAXINE HCL ER 75 MG PO CP24: 75.0000 mg | ORAL_CAPSULE | Freq: Every day | ORAL | 2 refills | Status: DC

## 2018-07-08 NOTE — Patient Instructions (Signed)

## 2018-07-08 NOTE — Progress Notes (Signed)
72 y.o. G29P0010 Married  Caucasian Fe here for annual exam. Menopausal no vaginal bleeding. Has not noted any vaginal dryness. Still under follow up with Oncology from right breast cancer. "Feels well".  PCP has left and she is going to establish with MD practice her spouse is seeing. Staying active, eats well balanced diet. No balance issues. Effexor working well for anxiety/depression needs update. No health issues today.   Patient's last menstrual period was 02/05/2002 (within weeks).          Sexually active: No.  The current method of family planning is tubal ligation.    Exercising: No.  exercise Smoker:  no  Review of Systems  Constitutional: Negative.   HENT: Negative.   Eyes: Negative.   Respiratory: Negative.   Cardiovascular: Negative.   Gastrointestinal: Negative.   Genitourinary: Negative.   Musculoskeletal: Negative.   Skin: Negative.   Neurological: Negative.   Endo/Heme/Allergies: Negative.   Psychiatric/Behavioral: Negative.     Health Maintenance: Pap:  04-09-15 ASCUS HPV HR neg, 04-13-16 ASCUS HPV HR neg, 05-06-17 neg History of Abnormal Pap: no per patient MMG:  01-06-18 left breast category b density birads birads 1:neg, rt breast mastectomy Self Breast exams: no Colonoscopy:  Flex Sig 06/10/12, normal; follow with Air Contrast Barium Enema; 07/22/12 BE: Tortuous colon. No persistent polypoid lesion. Repeat 10 years? BMD:   2017 osteopenia, plans to repeat next year 2020 TDaP:  2014 Shingles: 2016, 2019 Pneumonia: 2016 Hep C and HIV: hep c neg 2017 Labs: yes   reports that she has been smoking cigarettes. She has been smoking about 0.50 packs per day. She has never used smokeless tobacco. She reports that she drinks about 10.0 standard drinks of alcohol per week. She reports that she does not use drugs.  Past Medical History:  Diagnosis Date  . Arthritis   . Breast cancer ([place]) 2011   right invasive cancer  . Colon polyps 2001  . 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 Hearing loss 2005   Right ear, 60%  . Herniated disc 2003   L5-SI  . Hormone replacement therapy (postmenopausal)    2001- DC 2012 with cancer diagnosis  . Menopause   . Osteoporosis   . Pap smear abnormality of cervix with ASCUS favoring benign 03/2015   HR HPV negative  . Personal history of chemotherapy   . Psoriasis   . S/P breast augmentation 1987    Past Surgical History:  Procedure Laterality Date  . APPENDECTOMY  1983   LSO- Serous cystoadenoma  . BREAST SURGERY Bilateral 1987   bilateral breast augmentation  . BREAST SURGERY  03/13/11   right mastectomy-sln,ERPR+,Her2-, Reconstruction  . left index finger fusion     10/07; 3/08  . MASTECTOMY Right   . SALPINGECTOMY Right 1970's   Ectopic  . TUBAL LIGATION      Current Outpatient Medications  Medication Sig Dispense Refill  . ASHWAGANDHA PO Take by mouth.    . Calcium Carbonate-Vitamin D 600-400 MG-UNIT per chew tablet Chew 1 tablet by mouth daily.    . Cholecalciferol (VITAMIN D-3) 1000 UNITS CAPS Take by mouth.    . Cyanocobalamin (B-12 PO) Take by mouth.    . doxylamine, Sleep, (UNISOM) 25 MG tablet Take 25 mg by mouth at bedtime as needed.    . Flaxseed, Linseed, (FLAX SEEDS PO) Take by mouth.    . folic acid (FOLVITE) 1 MG tablet Take 1 mg by mouth.    [name] ibuprofen (ADVIL,MOTRIN)  800 MG tablet Take by mouth as needed.     [name] MACA ROOT PO Take by mouth. Takes 2    . MELATONIN PO Take by mouth.    . Misc Natural Products (OSTEO BI-FLEX ADV TRIPLE ST PO) Take by mouth.    . Multiple Vitamins-Minerals (PRESERVISION AREDS 2 PO) Take by mouth.    . Probiotic Product (PROBIOTIC PO) Take by mouth.    . ranitidine (ZANTAC) 150 MG tablet Take 150 mg by mouth 2 (two) times daily.    [name] venlafaxine XR (EFFEXOR-XR) 75 MG 24 hr capsule TAKE 1 CAPSULE(75 MG) BY MOUTH DAILY 90 capsule 1   No current facility-administered medications for this visit.     Family History  Problem Relation  Age of Onset  . Breast cancer Mother 7  . Lupus Sister        pt's twin  . Pancreatic cancer Father   . Cancer - Other Brother        oral cancer- resolved  . Dementia Brother   . Heart failure Maternal Grandmother   . Heart failure Maternal Grandfather   . Diabetes Paternal Grandmother   . Heart failure Paternal Grandfather   . Rheum arthritis Sister     ROS:  Pertinent items are noted in HPI.  Otherwise, a comprehensive ROS was negative.  Exam:   BP 104/64   Pulse 68   Resp 16   Ht 5' 3.25" (1.607 m)   Wt 127 lb (57.6 kg)   LMP 02/05/2002 (Within Weeks)   BMI 22.32 kg/m  Height: 5' 3.25" (160.7 cm) Ht Readings from Last 3 Encounters:  07/08/18 5' 3.25" (1.607 m)  11/03/17 '5\' 3"'  (1.6 m)  06/17/17 5' 3.5" (1.613 m)    General appearance: alert, cooperative and appears stated age Head: Normocephalic, without obvious abnormality, atraumatic Neck: no adenopathy, supple, symmetrical, trachea midline and thyroid normal to inspection and palpation Lungs: clear to auscultation bilaterally Breasts: normal appearance, no masses or tenderness, No nipple retraction or dimpling, No nipple discharge or bleeding, No axillary or supraclavicular adenopathy Heart: regular rate and rhythm Abdomen: soft, non-tender; no masses,  no organomegaly Extremities: extremities normal, atraumatic, no cyanosis or edema Skin: Skin color, texture, turgor normal. No rashes or lesions Lymph nodes: Cervical, supraclavicular, and axillary nodes normal. No abnormal inguinal nodes palpated Neurologic: Grossly normal   Pelvic: External genitalia:  no lesions              Urethra:  normal appearing urethra with no masses, tenderness or lesions              Bartholin's and Skene's: normal                 Vagina: normal appearing vagina with normal color and discharge, no lesions              Cervix: no cervical motion tenderness, no lesions and normal appearance              Pap taken: Yes.   Bimanual  Exam:  Uterus:  normal size, contour, position, consistency, mobility, non-tender and anteverted              Adnexa: normal adnexa and no mass, fullness, tenderness               Rectovaginal: Confirms               Anus:  normal sphincter tone, no lesions  Chaperone present: yes  A:  Well Woman with normal exam  Menopausal no HRT  History of Right breast cancer, still in follow up  Anxiety/depression Effexor working well  History of Osteopenia, working on weight bearing exercise and calcium  P:   Reviewed health and wellness pertinent to exam  Aware of need to advise if vaginal bleeding  Continue follow up as indicated and do SBE.  Risks/benefits/warning signs with Effexor use.  Rx Effexor see order with instructions  BMD due 2020  Pap smear: yes   counseled on breast self exam, mammography screening, feminine hygiene, adequate intake of calcium and vitamin D, diet and exercise  return annually or prn  An After Visit Summary was printed and given to the patient.

## 2018-07-12 LAB — CYTOLOGY - PAP
Adequacy: ABSENT
Diagnosis: NEGATIVE
HPV: NOT DETECTED

## 2018-10-20 DIAGNOSIS — H52223 Regular astigmatism, bilateral: Secondary | ICD-10-CM | POA: Diagnosis not present

## 2018-10-20 DIAGNOSIS — H5203 Hypermetropia, bilateral: Secondary | ICD-10-CM | POA: Diagnosis not present

## 2018-10-20 DIAGNOSIS — H353131 Nonexudative age-related macular degeneration, bilateral, early dry stage: Secondary | ICD-10-CM | POA: Diagnosis not present

## 2018-10-20 DIAGNOSIS — H524 Presbyopia: Secondary | ICD-10-CM | POA: Diagnosis not present

## 2018-10-20 DIAGNOSIS — L209 Atopic dermatitis, unspecified: Secondary | ICD-10-CM | POA: Diagnosis not present

## 2018-11-23 DIAGNOSIS — H6122 Impacted cerumen, left ear: Secondary | ICD-10-CM | POA: Diagnosis not present

## 2018-11-23 DIAGNOSIS — H6521 Chronic serous otitis media, right ear: Secondary | ICD-10-CM | POA: Diagnosis not present

## 2019-01-24 ENCOUNTER — Other Ambulatory Visit: Payer: Self-pay | Admitting: Certified Nurse Midwife

## 2019-01-24 DIAGNOSIS — Z1231 Encounter for screening mammogram for malignant neoplasm of breast: Secondary | ICD-10-CM

## 2019-01-31 DIAGNOSIS — R140 Abdominal distension (gaseous): Secondary | ICD-10-CM | POA: Diagnosis not present

## 2019-01-31 DIAGNOSIS — R152 Fecal urgency: Secondary | ICD-10-CM | POA: Diagnosis not present

## 2019-01-31 DIAGNOSIS — R194 Change in bowel habit: Secondary | ICD-10-CM | POA: Diagnosis not present

## 2019-01-31 DIAGNOSIS — Z1211 Encounter for screening for malignant neoplasm of colon: Secondary | ICD-10-CM | POA: Diagnosis not present

## 2019-02-02 DIAGNOSIS — R140 Abdominal distension (gaseous): Secondary | ICD-10-CM | POA: Diagnosis not present

## 2019-02-02 DIAGNOSIS — R152 Fecal urgency: Secondary | ICD-10-CM | POA: Diagnosis not present

## 2019-02-02 DIAGNOSIS — Z1211 Encounter for screening for malignant neoplasm of colon: Secondary | ICD-10-CM | POA: Diagnosis not present

## 2019-02-02 DIAGNOSIS — R194 Change in bowel habit: Secondary | ICD-10-CM | POA: Diagnosis not present

## 2019-03-15 ENCOUNTER — Ambulatory Visit
Admission: RE | Admit: 2019-03-15 | Discharge: 2019-03-15 | Disposition: A | Discharge status: Home / Self Care | Payer: PPO | Source: Ambulatory Visit | Attending: Certified Nurse Midwife | Admitting: Certified Nurse Midwife

## 2019-03-15 DIAGNOSIS — Z1231 Encounter for screening mammogram for malignant neoplasm of breast: Secondary | ICD-10-CM | POA: Diagnosis not present

## 2019-03-17 ENCOUNTER — Other Ambulatory Visit: Payer: Self-pay | Admitting: Certified Nurse Midwife

## 2019-03-17 DIAGNOSIS — N951 Menopausal and female climacteric states: Secondary | ICD-10-CM

## 2019-03-17 NOTE — Telephone Encounter (Signed)
Medication refill request: effexor Last AEX:  07/08/18 Next AEX: 07/14/19 Last MMG (if hormonal medication request): 03/15/19 Bi-rads 1 neg  Refill authorized: #90 with 1 RF

## 2019-03-20 DIAGNOSIS — H6123 Impacted cerumen, bilateral: Secondary | ICD-10-CM | POA: Diagnosis not present

## 2019-03-20 DIAGNOSIS — H8113 Benign paroxysmal vertigo, bilateral: Secondary | ICD-10-CM | POA: Diagnosis not present

## 2019-07-03 ENCOUNTER — Encounter (INDEPENDENT_AMBULATORY_CARE_PROVIDER_SITE_OTHER): Payer: Self-pay

## 2019-07-14 ENCOUNTER — Ambulatory Visit: Payer: PPO | Admitting: Certified Nurse Midwife

## 2019-07-20 ENCOUNTER — Ambulatory Visit (INDEPENDENT_AMBULATORY_CARE_PROVIDER_SITE_OTHER): Payer: PPO | Admitting: Otolaryngology

## 2019-07-21 ENCOUNTER — Encounter (INDEPENDENT_AMBULATORY_CARE_PROVIDER_SITE_OTHER): Payer: Self-pay | Admitting: Otolaryngology

## 2019-07-21 ENCOUNTER — Other Ambulatory Visit: Payer: Self-pay

## 2019-07-21 ENCOUNTER — Ambulatory Visit (INDEPENDENT_AMBULATORY_CARE_PROVIDER_SITE_OTHER): Payer: PPO | Admitting: Otolaryngology

## 2019-07-21 VITALS — Temp 97.7°F

## 2019-07-21 DIAGNOSIS — H6123 Impacted cerumen, bilateral: Secondary | ICD-10-CM | POA: Diagnosis not present

## 2019-07-21 NOTE — Progress Notes (Signed)
HPI: [name] is a 73 y.o. female who returns today for evaluation of of cerumen impactions.  Left side is worse than right side.  She wears bilateral hearing aids..  Past Medical History:  Diagnosis Date  . Arthritis   . Breast cancer ([place]) 2011   right invasive cancer  . Colon polyps 2001  . Complex endometrial hyperplasia without atypia 2000  . Hearing loss 2005   Right ear, 60%  . Herniated disc 2003   L5-SI  . Hormone replacement therapy (postmenopausal)    2001- DC 2012 with cancer diagnosis  . Menopause   . Osteoporosis   . Pap smear abnormality of cervix with ASCUS favoring benign 03/2015   HR HPV negative  . Personal history of chemotherapy   . Psoriasis   . S/P breast augmentation 1987   Past Surgical History:  Procedure Laterality Date  . APPENDECTOMY  1983   LSO- Serous cystoadenoma  . BREAST SURGERY Bilateral 1987   bilateral breast augmentation  . BREAST SURGERY  03/13/11   right mastectomy-sln,ERPR+,Her2-, Reconstruction  . left index finger fusion     10/07; 3/08  . MASTECTOMY Right   . SALPINGECTOMY Right 1970's   Ectopic  . TUBAL LIGATION     Social History   Socioeconomic History  . Marital status: Married    Spouse name: Not on file  . Number of children: Not on file  . Years of education: Not on file  . Highest education level: Not on file  Occupational History  . Not on file  Social Needs  . Financial resource strain: Not on file  . Food insecurity    Worry: Not on file    Inability: Not on file  . Transportation needs    Medical: Not on file    Non-medical: Not on file  Tobacco Use  . Smoking status: Current Every Day Smoker    Packs/day: 0.50    Years: 53.00    Pack years: 26.50    Types: Cigarettes    Start date: 7  . Smokeless tobacco: Never Used  Substance and Sexual Activity  . Alcohol use: Yes    Alcohol/week: 10.0 standard drinks    Types: 10 Standard drinks or equivalent per week  . Drug use: No  . Sexual  activity: Not Currently    Partners: Male    Birth control/protection: Post-menopausal, Surgical    Comment: tubal ligation  Lifestyle  . Physical activity    Days per week: Not on file    Minutes per session: Not on file  . Stress: Not on file  Relationships  . Social Herbalist on phone: Not on file    Gets together: Not on file    Attends religious service: Not on file    Active member of club or organization: Not on file    Attends meetings of clubs or organizations: Not on file    Relationship status: Not on file  Other Topics Concern  . Not on file  Social History Narrative  . Not on file   Family History  Problem Relation Age of Onset  . Breast cancer Mother 19  . Lupus Sister        pt's twin  . Pancreatic cancer Father   . Cancer - Other Brother        oral cancer- resolved  . Dementia Brother   . Heart failure Maternal Grandmother   . Heart failure Maternal Grandfather   .  Diabetes Paternal Grandmother   . Heart failure Paternal Grandfather   . Rheum arthritis Sister    Allergies  Allergen Reactions  . Dilaudid [Hydromorphone Hcl] Nausea And Vomiting and Other (See Comments)    Gastrointestinal distress  . Sulfa Antibiotics Nausea And Vomiting and Other (See Comments)    Gastrointestinal distress   Prior to Admission medications   Medication Sig Start Date End Date Taking? Authorizing Provider  ASHWAGANDHA PO Take by mouth.   Yes [provider]  Calcium Carbonate-Vitamin D 600-400 MG-UNIT per chew tablet Chew 1 tablet by mouth daily. 11/28/14  Yes Magrinat, Virgie Dad, MD  Cholecalciferol (VITAMIN D-3) 1000 UNITS CAPS Take by mouth.   Yes [provider]  Cyanocobalamin (B-12 PO) Take by mouth.   Yes [provider]  doxylamine, Sleep, (UNISOM) 25 MG tablet Take 25 mg by mouth at bedtime as needed.   Yes [provider]  Flaxseed, Linseed, (FLAX SEEDS PO) Take by mouth.   Yes [provider]  folic acid  (FOLVITE) 1 MG tablet Take 1 mg by mouth. 11/17/16  Yes [provider]  ibuprofen (ADVIL,MOTRIN) 800 MG tablet Take by mouth as needed.    Yes [provider]  MACA ROOT PO Take by mouth. Takes 2   Yes [provider]  MELATONIN PO Take by mouth.   Yes [provider]  Misc Natural Products (OSTEO BI-FLEX ADV TRIPLE ST PO) Take by mouth.   Yes [provider]  Multiple Vitamins-Minerals (PRESERVISION AREDS 2 PO) Take by mouth.   Yes [provider]  Probiotic Product (PROBIOTIC PO) Take by mouth.   Yes [provider]  ranitidine (ZANTAC) 150 MG tablet Take 150 mg by mouth 2 (two) times daily.   Yes [provider]  venlafaxine XR (EFFEXOR-XR) 75 MG 24 hr capsule TAKE 1 CAPSULE(75 MG) BY MOUTH DAILY 03/17/19  Yes Regina Eck, CNM     Positive ROS: Negative  All other systems have been reviewed and were otherwise negative with the exception of those mentioned in the HPI and as above.  Physical Exam: General: Alert, no acute distress Ears: She has large amount of wax in both ear canals left side worse than right.  This was cleaned with forceps and curettes.  TMs were clear bilaterally Nasal: Clear nasal passages Oral: Clear oropharynx Neck: No palpable adenopathy or masses  Cerumen impaction removal  Date/Time: 07/21/2019 4:18 PM Performed by: Rozetta Nunnery, MD Authorized by: Rozetta Nunnery, MD   Consent:    Consent obtained:  Verbal   Consent given by:  Patient   Risks discussed:  Pain and bleeding Procedure details:    Location:  L ear and R ear   Procedure type: curette and forceps   Post-procedure details:    Inspection:  TM intact and canal normal   Hearing quality:  Improved   Patient tolerance of procedure:  Tolerated well, no immediate complications    Assessment: Cerumen impactions  Plan: This was cleaned in the office she will follow-up as needed   Radene Journey, MD

## 2019-07-25 ENCOUNTER — Other Ambulatory Visit: Payer: Self-pay | Admitting: Certified Nurse Midwife

## 2019-07-25 DIAGNOSIS — N951 Menopausal and female climacteric states: Secondary | ICD-10-CM

## 2019-07-25 MED ORDER — VENLAFAXINE HCL ER 75 MG PO CP24: ORAL_CAPSULE | ORAL | 0 refills | Status: DC

## 2019-07-25 NOTE — Telephone Encounter (Signed)
Patient's husband is calling to request a refill for the patient's prescription for Effexor-XR 75 MG. Patient's husband confirmed pharmacy as [facility] on [address].

## 2019-07-25 NOTE — Telephone Encounter (Signed)
Spoke with pt. Pt requesting med refill. Pt states having 5 left and didn't want to run out.   Med refill request: Effexor-XR 75mg   Last AEX: 07/08/2018 Next AEX: 09/22/2019 Last MMG (if hormonal med) Refill authorized: 90 capsules, 0RF, orders pended if approved.

## 2019-09-12 DIAGNOSIS — H00021 Hordeolum internum right upper eyelid: Secondary | ICD-10-CM | POA: Diagnosis not present

## 2019-09-22 ENCOUNTER — Ambulatory Visit: Payer: PPO | Admitting: Certified Nurse Midwife

## 2019-10-25 ENCOUNTER — Other Ambulatory Visit: Payer: Self-pay | Admitting: Certified Nurse Midwife

## 2019-10-25 DIAGNOSIS — N951 Menopausal and female climacteric states: Secondary | ICD-10-CM

## 2019-10-25 NOTE — Telephone Encounter (Signed)
Patient returned call. Scheduled annual exam for 11/14/19.

## 2019-10-25 NOTE — Telephone Encounter (Signed)
Medication refill request: effexor  Last AEX:  07-08-18 DL  Next AEX: message left to call and schedule Last MMG (if hormonal medication request): 03-15-2019 density B/BIRADS 1 negative  Refill authorized: Today, please advise.   Message left for patient to call and schedule aex.   Medication pended for #30, 0RF. Please refill if appropriate.

## 2019-11-14 ENCOUNTER — Ambulatory Visit: Payer: PPO | Admitting: Certified Nurse Midwife

## 2019-11-27 ENCOUNTER — Ambulatory Visit: Payer: PPO | Admitting: Certified Nurse Midwife

## 2019-11-27 NOTE — Progress Notes (Deleted)
74 y.o. G1P0010 Married  Caucasian Fe here for annual exam.    Patient's last menstrual period was 02/05/2002 (within weeks).          Sexually active: {yes no:314532}  The current method of family planning is tubal ligation.    Exercising: {yes no:314532}  {types:19826} Smoker:  {YES NO:22349}  ROS  Health Maintenance: Pap:04-12-15 ASCUS HPV HR neg 04-13-16 ASCUS HPV HR neg, 05-06-17 neg, 07-08-18 neg HPV HR neg History of Abnormal Pap: no MMG:  03-15-2019 Left breast category b density birads 1:neg (rt breast mastectomy) Self Breast exams: {YES NO:22349} Colonoscopy:  Flex sig 06-10-12 normal; follow with air contrast barium enema; 07-22-12 BE: Tortuous colon. No persistent polypoid lesion BMD:   2017 TDaP:  2014 Shingles: 2019 Pneumonia: 2016 Hep C and HIV: hep c neg 2017 Labs: ***   reports that she has been smoking cigarettes. She started smoking about 54 years ago. She has a 26.50 pack-year smoking history. She has never used smokeless tobacco. She reports current alcohol use of about 10.0 standard drinks of alcohol per week. She reports that she does not use drugs.  Past Medical History:  Diagnosis Date  . Arthritis   . Breast cancer ([place]) 2011   right invasive cancer  . Colon polyps 2001  . Complex endometrial hyperplasia without atypia 2000  . Hearing loss 2005   Right ear, 60%  . Herniated disc 2003   L5-SI  . Hormone replacement therapy (postmenopausal)    2001- DC 2012 with cancer diagnosis  . Menopause   . Osteoporosis   . Pap smear abnormality of cervix with ASCUS favoring benign 03/2015   HR HPV negative  . Personal history of chemotherapy   . Psoriasis   . S/P breast augmentation 1987    Past Surgical History:  Procedure Laterality Date  . APPENDECTOMY  1983   LSO- Serous cystoadenoma  . BREAST SURGERY Bilateral 1987   bilateral breast augmentation  . BREAST SURGERY  03/13/11   right mastectomy-sln,ERPR+,Her2-, Reconstruction  . left index finger fusion      10/07; 3/08  . MASTECTOMY Right   . SALPINGECTOMY Right 1970's   Ectopic  . TUBAL LIGATION      Current Outpatient Medications  Medication Sig Dispense Refill  . venlafaxine XR (EFFEXOR-XR) 75 MG 24 hr capsule TAKE 1 CAPSULE(75 MG) BY MOUTH DAILY 30 capsule 0  . ASHWAGANDHA PO Take by mouth.    . Calcium Carbonate-Vitamin D 600-400 MG-UNIT per chew tablet Chew 1 tablet by mouth daily.    . Cholecalciferol (VITAMIN D-3) 1000 UNITS CAPS Take by mouth.    . Cyanocobalamin (B-12 PO) Take by mouth.    . doxylamine, Sleep, (UNISOM) 25 MG tablet Take 25 mg by mouth at bedtime as needed.    . Flaxseed, Linseed, (FLAX SEEDS PO) Take by mouth.    . folic acid (FOLVITE) 1 MG tablet Take 1 mg by mouth.    [name] ibuprofen (ADVIL,MOTRIN) 800 MG tablet Take by mouth as needed.     [name] MACA ROOT PO Take by mouth. Takes 2    . MELATONIN PO Take by mouth.    . Misc Natural Products (OSTEO BI-FLEX ADV TRIPLE ST PO) Take by mouth.    . Multiple Vitamins-Minerals (PRESERVISION AREDS 2 PO) Take by mouth.    . Probiotic Product (PROBIOTIC PO) Take by mouth.    . ranitidine (ZANTAC) 150 MG tablet Take 150 mg by mouth 2 (two) times daily.  No current facility-administered medications for this visit.    Family History  Problem Relation Age of Onset  . Breast cancer Mother 46  . Lupus Sister        pt's twin  . Pancreatic cancer Father   . Cancer - Other Brother        oral cancer- resolved  . Dementia Brother   . Heart failure Maternal Grandmother   . Heart failure Maternal Grandfather   . Diabetes Paternal Grandmother   . Heart failure Paternal Grandfather   . Rheum arthritis Sister     ROS:  Pertinent items are noted in HPI.  Otherwise, a comprehensive ROS was negative.  Exam:   LMP 02/05/2002 (Within Weeks)    Ht Readings from Last 3 Encounters:  07/08/18 5' 3.25" (1.607 m)  11/03/17 '5\' 3"'  (1.6 m)  06/17/17 5' 3.5" (1.613 m)    General appearance: alert, cooperative and appears stated  age Head: Normocephalic, without obvious abnormality, atraumatic Neck: no adenopathy, supple, symmetrical, trachea midline and thyroid {EXAM; THYROID:18604} Lungs: clear to auscultation bilaterally Breasts: {Exam; breast:13139::"normal appearance, no masses or tenderness"} Heart: regular rate and rhythm Abdomen: soft, non-tender; no masses,  no organomegaly Extremities: extremities normal, atraumatic, no cyanosis or edema Skin: Skin color, texture, turgor normal. No rashes or lesions Lymph nodes: Cervical, supraclavicular, and axillary nodes normal. No abnormal inguinal nodes palpated Neurologic: Grossly normal   Pelvic: External genitalia:  no lesions              Urethra:  normal appearing urethra with no masses, tenderness or lesions              Bartholin's and Skene's: normal                 Vagina: normal appearing vagina with normal color and discharge, no lesions              Cervix: {exam; cervix:14595}              Pap taken: {yes no:314532} Bimanual Exam:  Uterus:  {exam; uterus:12215}              Adnexa: {exam; adnexa:12223}               Rectovaginal: Confirms               Anus:  normal sphincter tone, no lesions  Chaperone present: ***  A:  Well Woman with normal exam  P:   Reviewed health and wellness pertinent to exam  Pap smear: {YES NO:22349}  {plan; gyn:5269::"mammogram","pap smear","return annually or prn"}  An After Visit Summary was printed and given to the patient.

## 2019-11-28 ENCOUNTER — Encounter: Payer: Self-pay | Admitting: Certified Nurse Midwife

## 2019-12-04 ENCOUNTER — Other Ambulatory Visit: Payer: Self-pay

## 2019-12-04 DIAGNOSIS — N951 Menopausal and female climacteric states: Secondary | ICD-10-CM

## 2019-12-04 MED ORDER — VENLAFAXINE HCL ER 75 MG PO CP24: ORAL_CAPSULE | ORAL | 0 refills | Status: DC

## 2019-12-04 NOTE — Telephone Encounter (Signed)
Patients husband called in regards to patient needing 3 pills of Venlafaxine before patients appointment on (12/07/19).

## 2019-12-04 NOTE — Telephone Encounter (Signed)
Medication refill request: effexor Last AEX:  07-08-18 Next AEX: 12-07-2019 with Dr Talbert Nan Last MMG (if hormonal medication request): n/a Refill authorized: please approve until aex if appropriate. Pharmacy note placed, no further refills until aex.

## 2019-12-04 NOTE — Telephone Encounter (Signed)
Patient notified

## 2019-12-06 ENCOUNTER — Telehealth: Payer: Self-pay

## 2019-12-06 NOTE — Telephone Encounter (Signed)
Opened in error

## 2019-12-07 ENCOUNTER — Ambulatory Visit: Payer: PPO | Admitting: Obstetrics and Gynecology

## 2020-01-02 NOTE — Progress Notes (Signed)
74 y.o. G1P0010 Married White or Caucasian Not Hispanic or Latino female here for annual exam.  Not sexually active. H/O severe atrophy. She is okay not having sex. Husband is healthy.   H/O right breast cancer, s/p chemo, right mastectomy, breast reconstruction, then treated with letrozole changed to tamoxifen, then arimidex. Her implant is uncomfortable, feels itchy inside her breast.   She has been on effexor for a long time. Was on it for mood changes and vasomotor symptoms. Would like to wean off.     Patient's last menstrual period was 02/05/2002 (within weeks).          Sexually active: No.  The current method of family planning is post menopausal status.    Exercising: No.  The patient does not participate in regular exercise at present. Smoker:  Yes, 1/2 a PPD  Health Maintenance: Pap:07/08/18 Negative, negative HPV. 05-06-17 neg: 04-13-16 ASCUS HPV HR neg,     04-09-15 ASCUS HPV HR neg, History of abnormal Pap:  no MMG:  03/15/19 density B Bi-rads 1 neg  BMD:2017 osteopenia, Tscore -1.7, FRAX 10.2/2.6%    Colonoscopy: Flex Sig 06/10/12, normal; follow with Air Contrast Barium Enema; 07/22/12 BE: Tortuous colon. No persistent polypoid lesion. Repeat 10 years TDaP:  2014 Gardasil: NA   reports that she has been smoking cigarettes. She started smoking about 54 years ago. She has a 26.50 pack-year smoking history. She has never used smokeless tobacco. She reports current alcohol use of about 10.0 standard drinks of alcohol per week. She reports that she does not use drugs. Happily married x 53 years.   Past Medical History:  Diagnosis Date  . Arthritis   . Breast cancer ([place]) 2011   right invasive cancer  . Colon polyps 2001  . Complex endometrial hyperplasia without atypia 2000  . Hearing loss 2005   Right ear, 60%  . Herniated disc 2003   L5-SI  . Hormone replacement therapy (postmenopausal)    2001- DC 2012 with cancer diagnosis  . Menopause   . Osteoporosis   . Pap smear  abnormality of cervix with ASCUS favoring benign 03/2015   HR HPV negative  . Personal history of chemotherapy   . Psoriasis   . S/P breast augmentation 1987    Past Surgical History:  Procedure Laterality Date  . APPENDECTOMY  1983   LSO- Serous cystoadenoma  . BREAST SURGERY Bilateral 1987   bilateral breast augmentation  . BREAST SURGERY  03/13/11   right mastectomy-sln,ERPR+,Her2-, Reconstruction  . left index finger fusion     10/07; 3/08  . MASTECTOMY Right   . SALPINGECTOMY Right 1970's   Ectopic  . TUBAL LIGATION      Current Outpatient Medications  Medication Sig Dispense Refill  . Cholecalciferol (VITAMIN D-3) 1000 UNITS CAPS Take by mouth.    . doxylamine, Sleep, (UNISOM) 25 MG tablet Take 25 mg by mouth at bedtime as needed.    . Flaxseed, Linseed, (FLAX SEEDS PO) Take by mouth.    . folic acid (FOLVITE) 1 MG tablet Take 1 mg by mouth.    [name] ibuprofen (ADVIL,MOTRIN) 800 MG tablet Take by mouth as needed.     [name] MELATONIN PO Take by mouth.    . Misc Natural Products (OSTEO BI-FLEX ADV TRIPLE ST PO) Take by mouth.    . Multiple Vitamins-Minerals (PRESERVISION AREDS 2 PO) Take by mouth.    . Probiotic Product (PROBIOTIC PO) Take by mouth.    . venlafaxine XR (EFFEXOR-XR) 75 MG  24 hr capsule Take 1 po daily 30 capsule 0   No current facility-administered medications for this visit.    Family History  Problem Relation Age of Onset  . Breast cancer Mother 25  . Lupus Sister        pt's twin  . Pancreatic cancer Father   . Cancer - Other Brother        oral cancer- resolved  . Dementia Brother   . Heart failure Maternal Grandmother   . Heart failure Maternal Grandfather   . Diabetes Paternal Grandmother   . Heart failure Paternal Grandfather   . Rheum arthritis Sister     Review of Systems  Constitutional: Negative.   HENT: Negative.   Eyes: Negative.   Respiratory: Negative.   Cardiovascular: Negative.   Gastrointestinal: Negative.   Endocrine:  Negative.   Genitourinary: Negative.   Musculoskeletal: Negative.   Skin: Negative.   Allergic/Immunologic: Negative.   Neurological: Negative.   Hematological: Negative.   Psychiatric/Behavioral: Negative.   H/O mild GSI, tolerable, just on occasion  Exam:   BP 118/68 (BP Location: Right Arm, Patient Position: Sitting, Cuff Size: Normal)   Pulse 88   Temp (!) 97.5 F (36.4 C) (Skin)   Resp 14   Ht 5' 3.75" (1.619 m)   Wt 125 lb 6.4 oz (56.9 kg)   LMP 02/05/2002 (Within Weeks)   BMI 21.69 kg/m   Weight change: '@WEIGHTCHANGE' @ Height:   Height: 5' 3.75" (161.9 cm)  Ht Readings from Last 3 Encounters:  01/03/20 5' 3.75" (1.619 m)  07/08/18 5' 3.25" (1.607 m)  11/03/17 '5\' 3"'  (1.6 m)    General appearance: alert, cooperative and appears stated age Head: Normocephalic, without obvious abnormality, atraumatic Neck: no adenopathy, supple, symmetrical, trachea midline and thyroid normal to inspection and palpation Lungs: clear to auscultation bilaterally Cardiovascular: regular rate and rhythm Breasts: left breast implant, no lumps, dimpling or adenopathy. Evidence of right mastectomy, breast implant, the skin feels tight around the implant, no masses  Abdomen: soft, non-tender; non distended,  no masses,  no organomegaly Extremities: extremities normal, atraumatic, no cyanosis or edema Skin: Skin color, texture, turgor normal. No rashes or lesions Lymph nodes: Cervical, supraclavicular, and axillary nodes normal. No abnormal inguinal nodes palpated Neurologic: Grossly normal   Pelvic: External genitalia:  no lesions              Urethra:  normal appearing urethra with no masses, tenderness or lesions              Bartholins and Skenes: normal                 Vagina: atrophic appearing vagina with normal color and discharge, no lesions              Cervix: no lesions               Bimanual Exam:  Uterus:  normal size, contour, position, consistency, mobility, non-tender               Adnexa: no mass, fullness, tenderness               Rectovaginal: Confirms               Anus:  normal sphincter tone, no lesions  Gae Dry chaperoned for the exam.  A:  Well Woman with normal exam  H/O breast cancer  H/O osteopenia  Vaginal atrophy  Mild, tolerable GSI P:   No pap needed  Mammogram  this summer  DEXA ordered  Will check when she is doing colon cancer screening, declines colonoscopy  Discussed quitting cigarettes  Screening labs  Will decrease her effexor to 37.5 mg daily, if she is doing well on this dose after a few months she will consider coming off. She will also call if she feels she needs to increase the dose.

## 2020-01-03 ENCOUNTER — Ambulatory Visit (INDEPENDENT_AMBULATORY_CARE_PROVIDER_SITE_OTHER): Payer: PPO | Admitting: Obstetrics and Gynecology

## 2020-01-03 ENCOUNTER — Other Ambulatory Visit: Payer: Self-pay

## 2020-01-03 ENCOUNTER — Encounter: Payer: Self-pay | Admitting: Obstetrics and Gynecology

## 2020-01-03 VITALS — BP 118/68 | HR 88 | Temp 97.5°F | Resp 14 | Ht 63.75 in | Wt 125.4 lb

## 2020-01-03 DIAGNOSIS — N952 Postmenopausal atrophic vaginitis: Secondary | ICD-10-CM | POA: Diagnosis not present

## 2020-01-03 DIAGNOSIS — Z0000 Encounter for general adult medical examination without abnormal findings: Secondary | ICD-10-CM | POA: Diagnosis not present

## 2020-01-03 DIAGNOSIS — N393 Stress incontinence (female) (male): Secondary | ICD-10-CM | POA: Diagnosis not present

## 2020-01-03 DIAGNOSIS — Z853 Personal history of malignant neoplasm of breast: Secondary | ICD-10-CM | POA: Diagnosis not present

## 2020-01-03 DIAGNOSIS — Z01419 Encounter for gynecological examination (general) (routine) without abnormal findings: Secondary | ICD-10-CM

## 2020-01-03 DIAGNOSIS — E559 Vitamin D deficiency, unspecified: Secondary | ICD-10-CM | POA: Diagnosis not present

## 2020-01-03 DIAGNOSIS — Z8739 Personal history of other diseases of the musculoskeletal system and connective tissue: Secondary | ICD-10-CM | POA: Diagnosis not present

## 2020-01-03 MED ORDER — VENLAFAXINE HCL ER 37.5 MG PO CP24: 37.5000 mg | ORAL_CAPSULE | Freq: Every day | ORAL | 3 refills | Status: DC

## 2020-01-03 NOTE — Patient Instructions (Signed)
EXERCISE AND DIET:  We recommended that you start or continue a regular exercise program for good health. Regular exercise means any activity that makes your heart beat faster and makes you sweat.  We recommend exercising at least 30 minutes per day at least 3 days a week, preferably 4 or 5.  We also recommend a diet low in fat and sugar.  Inactivity, poor dietary choices and obesity can cause diabetes, heart attack, stroke, and kidney damage, among others.    ALCOHOL AND SMOKING:  Women should limit their alcohol intake to no more than 7 drinks/beers/glasses of wine (combined, not each!) per week. Moderation of alcohol intake to this level decreases your risk of breast cancer and liver damage. And of course, no recreational drugs are part of a healthy lifestyle.  And absolutely no smoking or even second hand smoke. Most people know smoking can cause heart and lung diseases, but did you know it also contributes to weakening of your bones? Aging of your skin?  Yellowing of your teeth and nails?  CALCIUM AND VITAMIN D:  Adequate intake of calcium and Vitamin D are recommended.  The recommendations for exact amounts of these supplements seem to change often, but generally speaking 1,200 mg of calcium (between diet and supplement) and 800 units of Vitamin D per day seems prudent. Certain women may benefit from higher intake of Vitamin D.  If you are among these women, your doctor will have told you during your visit.    PAP SMEARS:  Pap smears, to check for cervical cancer or precancers,  have traditionally been done yearly, although recent scientific advances have shown that most women can have pap smears less often.  However, every woman still should have a physical exam from her gynecologist every year. It will include a breast check, inspection of the vulva and vagina to check for abnormal growths or skin changes, a visual exam of the cervix, and then an exam to evaluate the size and shape of the uterus and  ovaries.  And after 74 years of age, a rectal exam is indicated to check for rectal cancers. We will also provide age appropriate advice regarding health maintenance, like when you should have certain vaccines, screening for sexually transmitted diseases, bone density testing, colonoscopy, mammograms, etc.   MAMMOGRAMS:  All women over 40 years old should have a yearly mammogram. Many facilities now offer a "3D" mammogram, which may cost around $50 extra out of pocket. If possible,  we recommend you accept the option to have the 3D mammogram performed.  It both reduces the number of women who will be called back for extra views which then turn out to be normal, and it is better than the routine mammogram at detecting truly abnormal areas.    COLON CANCER SCREENING: Now recommend starting at age 45. At this time colonoscopy is not covered for routine screening until 50. There are take home tests that can be done between 45-49.   COLONOSCOPY:  Colonoscopy to screen for colon cancer is recommended for all women at age 50.  We know, you hate the idea of the prep.  We agree, BUT, having colon cancer and not knowing it is worse!!  Colon cancer so often starts as a polyp that can be seen and removed at colonscopy, which can quite literally save your life!  And if your first colonoscopy is normal and you have no family history of colon cancer, most women don't have to have it again for   10 years.  Once every ten years, you can do something that may end up saving your life, right?  We will be happy to help you get it scheduled when you are ready.  Be sure to check your insurance coverage so you understand how much it will cost.  It may be covered as a preventative service at no cost, but you should check your particular policy.      Breast Self-Awareness Breast self-awareness means being familiar with how your breasts look and feel. It involves checking your breasts regularly and reporting any changes to your  health care provider. Practicing breast self-awareness is important. A change in your breasts can be a sign of a serious medical problem. Being familiar with how your breasts look and feel allows you to find any problems early, when treatment is more likely to be successful. All women should practice breast self-awareness, including women who have had breast implants. How to do a breast self-exam One way to learn what is normal for your breasts and whether your breasts are changing is to do a breast self-exam. To do a breast self-exam: Look for Changes  1. Remove all the clothing above your waist. 2. Stand in front of a mirror in a room with good lighting. 3. Put your hands on your hips. 4. Push your hands firmly downward. 5. Compare your breasts in the mirror. Look for differences between them (asymmetry), such as: ? Differences in shape. ? Differences in size. ? Puckers, dips, and bumps in one breast and not the other. 6. Look at each breast for changes in your skin, such as: ? Redness. ? Scaly areas. 7. Look for changes in your nipples, such as: ? Discharge. ? Bleeding. ? Dimpling. ? Redness. ? A change in position. Feel for Changes Carefully feel your breasts for lumps and changes. It is best to do this while lying on your back on the floor and again while sitting or standing in the shower or tub with soapy water on your skin. Feel each breast in the following way:  Place the arm on the side of the breast you are examining above your head.  Feel your breast with the other hand.  Start in the nipple area and make  inch (2 cm) overlapping circles to feel your breast. Use the pads of your three middle fingers to do this. Apply light pressure, then medium pressure, then firm pressure. The light pressure will allow you to feel the tissue closest to the skin. The medium pressure will allow you to feel the tissue that is a little deeper. The firm pressure will allow you to feel the tissue  close to the ribs.  Continue the overlapping circles, moving downward over the breast until you feel your ribs below your breast.  Move one finger-width toward the center of the body. Continue to use the  inch (2 cm) overlapping circles to feel your breast as you move slowly up toward your collarbone.  Continue the up and down exam using all three pressures until you reach your armpit.  Write Down What You Find  Write down what is normal for each breast and any changes that you find. Keep a written record with breast changes or normal findings for each breast. By writing this information down, you do not need to depend only on memory for size, tenderness, or location. Write down where you are in your menstrual cycle, if you are still menstruating. If you are having trouble noticing differences   in your breasts, do not get discouraged. With time you will become more familiar with the variations in your breasts and more comfortable with the exam. How often should I examine my breasts? Examine your breasts every month. If you are breastfeeding, the best time to examine your breasts is after a feeding or after using a breast pump. If you menstruate, the best time to examine your breasts is 5-7 days after your period is over. During your period, your breasts are lumpier, and it may be more difficult to notice changes. When should I see my health care provider? See your health care provider if you notice:  A change in shape or size of your breasts or nipples.  A change in the skin of your breast or nipples, such as a reddened or scaly area.  Unusual discharge from your nipples.  A lump or thick area that was not there before.  Pain in your breasts.  Anything that concerns you.    Steps to Quit Smoking Smoking tobacco is the leading cause of preventable death. It can affect almost every organ in the body. Smoking puts you and those around you at risk for developing many serious chronic  diseases. Quitting smoking can be difficult, but it is one of the best things that you can do for your health. It is never too late to quit. How do I get ready to quit? When you decide to quit smoking, create a plan to help you succeed. Before you quit:  Pick a date to quit. Set a date within the next 2 weeks to give you time to prepare.  Write down the reasons why you are quitting. Keep this list in places where you will see it often.  Tell your family, friends, and co-workers that you are quitting. Support from your loved ones can make quitting easier.  Talk with your health care provider about your options for quitting smoking.  Find out what treatment options are covered by your health insurance.  Identify people, places, things, and activities that make you want to smoke (triggers). Avoid them. What first steps can I take to quit smoking?  Throw away all cigarettes at home, at work, and in your car.  Throw away smoking accessories, such as Scientist, research (medical).  Clean your car. Make sure to empty the ashtray.  Clean your home, including curtains and carpets. What strategies can I use to quit smoking? Talk with your health care provider about combining strategies, such as taking medicines while you are also receiving in-person counseling. Using these two strategies together makes you more likely to succeed in quitting than if you used either strategy on its own.  If you are pregnant or breastfeeding, talk with your health care provider about finding counseling or other support strategies to quit smoking. Do not take medicine to help you quit smoking unless your health care provider tells you to do so. To quit smoking: Quit right away  Quit smoking completely, instead of gradually reducing how much you smoke over a period of time. Research shows that stopping smoking right away is more successful than gradually quitting.  Attend in-person counseling to help you build  problem-solving skills. You are more likely to succeed in quitting if you attend counseling sessions regularly. Even short sessions of 10 minutes can be effective. Take medicine You may take medicines to help you quit smoking. Some medicines require a prescription and some you can purchase over-the-counter. Medicines may have nicotine in them to replace  the nicotine in cigarettes. Medicines may:  Help to stop cravings.  Help to relieve withdrawal symptoms. Your health care provider may recommend:  Nicotine patches, gum, or lozenges.  Nicotine inhalers or sprays.  Non-nicotine medicine that is taken by mouth. Find resources Find resources and support systems that can help you to quit smoking and remain smoke-free after you quit. These resources are most helpful when you use them often. They include:  Online chats with a Social worker.  Telephone quitlines.  Printed Furniture conservator/restorer.  Support groups or group counseling.  Text messaging programs.  Mobile phone apps or applications. Use apps that can help you stick to your quit plan by providing reminders, tips, and encouragement. There are many free apps for mobile devices as well as websites. Examples include Quit Guide from the [employer] and smokefree.gov What things can I do to make it easier to quit?   Reach out to your family and friends for support and encouragement. Call telephone quitlines (1-800-QUIT-NOW), reach out to support groups, or work with a counselor for support.  Ask people who smoke to avoid smoking around you.  Avoid places that trigger you to smoke, such as bars, parties, or smoke-break areas at work.  Spend time with people who do not smoke.  Lessen the stress in your life. Stress can be a smoking trigger for some people. To lessen stress, try: ? Exercising regularly. ? Doing deep-breathing exercises. ? Doing yoga. ? Meditating. ? Performing a body scan. This involves closing your eyes, scanning your body from  head to toe, and noticing which parts of your body are particularly tense. Try to relax the muscles in those areas. How will I feel when I quit smoking? Day 1 to 3 weeks Within the first 24 hours of quitting smoking, you may start to feel withdrawal symptoms. These symptoms are usually most noticeable 2-3 days after quitting, but they usually do not last for more than 2-3 weeks. You may experience these symptoms:  Mood swings.  Restlessness, anxiety, or irritability.  Trouble concentrating.  Dizziness.  Strong cravings for sugary foods and nicotine.  Mild weight gain.  Constipation.  Nausea.  Coughing or a sore throat.  Changes in how the medicines that you take for unrelated issues work in your body.  Depression.  Trouble sleeping (insomnia). Week 3 and afterward After the first 2-3 weeks of quitting, you may start to notice more positive results, such as:  Improved sense of smell and taste.  Decreased coughing and sore throat.  Slower heart rate.  Lower blood pressure.  Clearer skin.  The ability to breathe more easily.  Fewer sick days. Quitting smoking can be very challenging. Do not get discouraged if you are not successful the first time. Some people need to make many attempts to quit before they achieve long-term success. Do your best to stick to your quit plan, and talk with your health care provider if you have any questions or concerns. Summary  Smoking tobacco is the leading cause of preventable death. Quitting smoking is one of the best things that you can do for your health.  When you decide to quit smoking, create a plan to help you succeed.  Quit smoking right away, not slowly over a period of time.  When you start quitting, seek help from your health care provider, family, or friends. This information is not intended to replace advice given to you by your health care provider. Make sure you discuss any questions you have with your  health care  provider. Document Revised: 05/19/2019 Document Reviewed: 11/12/2018 Elsevier Patient Education  [place].   Kegel Exercises  Kegel exercises can help strengthen your pelvic floor muscles. The pelvic floor is a group of muscles that support your rectum, small intestine, and bladder. In females, pelvic floor muscles also help support the womb (uterus). These muscles help you control the flow of urine and stool. Kegel exercises are painless and simple, and they do not require any equipment. Your provider may suggest Kegel exercises to:  Improve bladder and bowel control.  Improve sexual response.  Improve weak pelvic floor muscles after surgery to remove the uterus (hysterectomy) or pregnancy (females).  Improve weak pelvic floor muscles after prostate gland removal or surgery (males). Kegel exercises involve squeezing your pelvic floor muscles, which are the same muscles you squeeze when you try to stop the flow of urine or keep from passing gas. The exercises can be done while sitting, standing, or lying down, but it is best to vary your position. Exercises How to do Kegel exercises: 1. Squeeze your pelvic floor muscles tight. You should feel a tight lift in your rectal area. If you are a female, you should also feel a tightness in your vaginal area. Keep your stomach, buttocks, and legs relaxed. 2. Hold the muscles tight for up to 10 seconds. 3. Breathe normally. 4. Relax your muscles. 5. Repeat as told by your health care provider. Repeat this exercise daily as told by your health care provider. Continue to do this exercise for at least 4-6 weeks, or for as long as told by your health care provider. You may be referred to a physical therapist who can help you learn more about how to do Kegel exercises. Depending on your condition, your health care provider may recommend:  Varying how long you squeeze your muscles.  Doing several sets of exercises every day.  Doing  exercises for several weeks.  Making Kegel exercises a part of your regular exercise routine. This information is not intended to replace advice given to you by your health care provider. Make sure you discuss any questions you have with your health care provider. Document Revised: 04/13/2018 Document Reviewed: 04/13/2018 Elsevier Patient Education  [place].

## 2020-01-04 ENCOUNTER — Telehealth: Payer: Self-pay

## 2020-01-04 LAB — CBC
Hematocrit: 42.7 % (ref 34.0–46.6)
Hemoglobin: 14.9 g/dL (ref 11.1–15.9)
MCH: 33.8 pg — ABNORMAL HIGH (ref 26.6–33.0)
MCHC: 34.9 g/dL (ref 31.5–35.7)
MCV: 97 fL (ref 79–97)
Platelets: 381 10*3/uL (ref 150–450)
RBC: 4.41 x10E6/uL (ref 3.77–5.28)
RDW: 12.7 % (ref 11.7–15.4)
WBC: 7.6 10*3/uL (ref 3.4–10.8)

## 2020-01-04 LAB — LIPID PANEL
Chol/HDL Ratio: 3.3 ratio (ref 0.0–4.4)
Cholesterol, Total: 254 mg/dL — ABNORMAL HIGH (ref 100–199)
HDL: 76 mg/dL (ref >39)
LDL Chol Calc (NIH): 154 mg/dL — ABNORMAL HIGH (ref 0–99)
Triglycerides: 138 mg/dL (ref 0–149)
VLDL Cholesterol Cal: 24 mg/dL (ref 5–40)

## 2020-01-04 LAB — COMPREHENSIVE METABOLIC PANEL
ALT: 8 IU/L (ref 0–32)
AST: 14 IU/L (ref 0–40)
Albumin/Globulin Ratio: 2.8 — ABNORMAL HIGH (ref 1.2–2.2)
Albumin: 4.8 g/dL — ABNORMAL HIGH (ref 3.7–4.7)
Alkaline Phosphatase: 123 IU/L — ABNORMAL HIGH (ref 39–117)
BUN/Creatinine Ratio: 24 (ref 12–28)
BUN: 24 mg/dL (ref 8–27)
Bilirubin Total: 0.2 mg/dL (ref 0.0–1.2)
CO2: 25 mmol/L (ref 20–29)
Calcium: 9.7 mg/dL (ref 8.7–10.3)
Chloride: 101 mmol/L (ref 96–106)
Creatinine, Ser: 1 mg/dL (ref 0.57–1.00)
GFR calc Af Amer: 65 mL/min/{1.73_m2} (ref >59)
GFR calc non Af Amer: 56 mL/min/{1.73_m2} — ABNORMAL LOW (ref >59)
Globulin, Total: 1.7 g/dL (ref 1.5–4.5)
Glucose: 80 mg/dL (ref 65–99)
Potassium: 3.8 mmol/L (ref 3.5–5.2)
Sodium: 139 mmol/L (ref 134–144)
Total Protein: 6.5 g/dL (ref 6.0–8.5)

## 2020-01-04 LAB — VITAMIN D 25 HYDROXY (VIT D DEFICIENCY, FRACTURES): Vit D, 25-Hydroxy: 58 ng/mL (ref 30.0–100.0)

## 2020-01-04 NOTE — Telephone Encounter (Signed)
Please let the patient know and have her call to schedule the colonoscopy.

## 2020-01-04 NOTE — Telephone Encounter (Signed)
Call returned from [place]. Spoke with [employer].  Stated last colonoscopy was 06/10/2012 and was due for next on 07/2017.  Colonoscopy report will be faxed to office for Dr Talbert Nan to review once has release of information from pt due to 3rd party. Fax number given.   Routing to Dr Talbert Nan.

## 2020-01-04 NOTE — Telephone Encounter (Signed)
Called [facility] to get update when pt should have next colonoscopy. Left message for return call to triage RN.

## 2020-01-04 NOTE — Telephone Encounter (Signed)
-----   Message from Nancy Dom, MD sent at 01/03/2020  4:00 PM EDT ----- Can you please figure out when this patient is do for colon cancer screening? I can find her sigmoidoscopy report and her Barium enema in 2013, but can't find recommendations.  Seen at [facility]. She won't do another colonoscopy, consider cologuard.

## 2020-01-05 NOTE — Telephone Encounter (Signed)
Left message for pt to return call to triage RN. 

## 2020-01-09 NOTE — Telephone Encounter (Signed)
Left message for pt to return call to triage RN. 

## 2020-01-23 NOTE — Telephone Encounter (Signed)
Spoke with pt. Pt given recommendations and update on colonoscopy per Dr Talbert Nan. Pt agreeable.   Pt states going back to 75mg  of Effexor since last visit with Dr Talbert Nan due to "feeling strange" on 37.5 mg prescribed on 01/03/20. Will update Dr Talbert Nan and if any additional recommendations will return call to pt. Pt agreeable.   Pt also states has PCP appt soon to follow up with elevated Lipid panel.   Routing to Dr Talbert Nan.  Encounter closed.

## 2020-01-23 NOTE — Telephone Encounter (Signed)
Patient returned call

## 2020-01-23 NOTE — Telephone Encounter (Signed)
Left message for pt to return call to triage RN.  Attempted 3 times to contact, no return call.   Routing to Dr Talbert Nan.   Should we send Letter to pt to schedule colonoscopy?

## 2020-01-29 DIAGNOSIS — Z853 Personal history of malignant neoplasm of breast: Secondary | ICD-10-CM | POA: Diagnosis not present

## 2020-01-29 DIAGNOSIS — G4700 Insomnia, unspecified: Secondary | ICD-10-CM | POA: Diagnosis not present

## 2020-01-29 DIAGNOSIS — H6123 Impacted cerumen, bilateral: Secondary | ICD-10-CM | POA: Diagnosis not present

## 2020-01-29 DIAGNOSIS — E785 Hyperlipidemia, unspecified: Secondary | ICD-10-CM | POA: Diagnosis not present

## 2020-01-29 DIAGNOSIS — N951 Menopausal and female climacteric states: Secondary | ICD-10-CM | POA: Diagnosis not present

## 2020-01-29 DIAGNOSIS — Z1331 Encounter for screening for depression: Secondary | ICD-10-CM | POA: Diagnosis not present

## 2020-01-29 DIAGNOSIS — H9190 Unspecified hearing loss, unspecified ear: Secondary | ICD-10-CM | POA: Diagnosis not present

## 2020-01-29 NOTE — Telephone Encounter (Signed)
Please send in Effexor 75 mg, #90 with 3 refills and let her know.

## 2020-01-30 NOTE — Telephone Encounter (Signed)
Spoke with pt's husband Clair Gulling, ok per DPR. Given update on Rx refill for Effexor. Clair Gulling states pt has seen Dr Ardeth Perfect yesterday as new PCP and has gotten refills for Effexor Rx.  Advised will give update to Dr Talbert Nan. Updated Med list and PCP.   Routing to Dr Talbert Nan for update and review.  Encounter closed.

## 2020-01-30 NOTE — Addendum Note (Signed)
Addended by: Georgia Lopes on: 01/30/2020 10:20 AM   Modules accepted: Orders

## 2020-02-08 ENCOUNTER — Ambulatory Visit (INDEPENDENT_AMBULATORY_CARE_PROVIDER_SITE_OTHER): Payer: PPO | Admitting: Otolaryngology

## 2020-02-08 ENCOUNTER — Other Ambulatory Visit: Payer: Self-pay

## 2020-02-08 VITALS — Temp 97.3°F

## 2020-02-08 DIAGNOSIS — H6123 Impacted cerumen, bilateral: Secondary | ICD-10-CM

## 2020-02-08 NOTE — Progress Notes (Signed)
HPI: [name] is a 74 y.o. female who presents for evaluation of wax buildup in her ears.  She wears bilateral hearing aids.  She was last cleaned about 10 months ago..  Past Medical History:  Diagnosis Date  . Arthritis   . Breast cancer ([place]) 2011   right invasive cancer  . Colon polyps 2001  . Complex endometrial hyperplasia without atypia 2000  . Hearing loss 2005   Right ear, 60%  . Herniated disc 2003   L5-SI  . Hormone replacement therapy (postmenopausal)    2001- DC 2012 with cancer diagnosis  . Menopause   . Osteoporosis   . Pap smear abnormality of cervix with ASCUS favoring benign 03/2015   HR HPV negative  . Personal history of chemotherapy   . Psoriasis   . S/P breast augmentation 1987   Past Surgical History:  Procedure Laterality Date  . APPENDECTOMY  1983   LSO- Serous cystoadenoma  . BREAST SURGERY Bilateral 1987   bilateral breast augmentation  . BREAST SURGERY  03/13/11   right mastectomy-sln,ERPR+,Her2-, Reconstruction  . left index finger fusion     10/07; 3/08  . MASTECTOMY Right   . SALPINGECTOMY Right 1970's   Ectopic  . TUBAL LIGATION     Social History   Socioeconomic History  . Marital status: Married    Spouse name: Not on file  . Number of children: Not on file  . Years of education: Not on file  . Highest education level: Not on file  Occupational History  . Not on file  Tobacco Use  . Smoking status: Current Every Day Smoker    Packs/day: 0.50    Years: 53.00    Pack years: 26.50    Types: Cigarettes    Start date: 38  . Smokeless tobacco: Never Used  Substance and Sexual Activity  . Alcohol use: Yes    Alcohol/week: 10.0 standard drinks    Types: 10 Standard drinks or equivalent per week  . Drug use: No  . Sexual activity: Not Currently    Partners: Male    Birth control/protection: Post-menopausal, Surgical    Comment: tubal ligation  Other Topics Concern  . Not on file  Social History Narrative  . Not on  file   Social Determinants of Health   Financial Resource Strain:   . Difficulty of Paying Living Expenses:   Food Insecurity:   . Worried About Charity fundraiser in the Last Year:   . Arboriculturist in the Last Year:   Transportation Needs:   . Film/video editor (Medical):   [name] Lack of Transportation (Non-Medical):   Physical Activity:   . Days of Exercise per Week:   . Minutes of Exercise per Session:   Stress:   . Feeling of Stress :   Social Connections:   . Frequency of Communication with Friends and Family:   . Frequency of Social Gatherings with Friends and Family:   . Attends Religious Services:   . Active Member of Clubs or Organizations:   . Attends Archivist Meetings:   [name] Marital Status:    Family History  Problem Relation Age of Onset  . Breast cancer Mother 81  . Lupus Sister        pt's twin  . Pancreatic cancer Father   . Cancer - Other Brother        oral cancer- resolved  . Dementia Brother   . Heart failure Maternal Grandmother   .  Heart failure Maternal Grandfather   . Diabetes Paternal Grandmother   . Heart failure Paternal Grandfather   . Rheum arthritis Sister    Allergies  Allergen Reactions  . Dilaudid [Hydromorphone Hcl] Nausea And Vomiting and Other (See Comments)    Gastrointestinal distress  . Sulfa Antibiotics Nausea And Vomiting and Other (See Comments)    Gastrointestinal distress   Prior to Admission medications   Medication Sig Start Date End Date Taking? Authorizing Provider  Cholecalciferol (VITAMIN D-3) 1000 UNITS CAPS Take by mouth.   Yes [provider]  doxylamine, Sleep, (UNISOM) 25 MG tablet Take 25 mg by mouth at bedtime as needed.   Yes [provider]  Flaxseed, Linseed, (FLAX SEEDS PO) Take by mouth.   Yes [provider]  folic acid (FOLVITE) 1 MG tablet Take 1 mg by mouth. 11/17/16  Yes [provider]  ibuprofen (ADVIL,MOTRIN) 800 MG tablet Take by mouth as needed.     Yes [provider]  MELATONIN PO Take by mouth.   Yes [provider]  Misc Natural Products (OSTEO BI-FLEX ADV TRIPLE ST PO) Take by mouth.   Yes [provider]  Multiple Vitamins-Minerals (PRESERVISION AREDS 2 PO) Take by mouth.   Yes [provider]  Probiotic Product (PROBIOTIC PO) Take by mouth.   Yes [provider]  venlafaxine XR (EFFEXOR-XR) 75 MG 24 hr capsule Take 75 mg by mouth daily with breakfast.   Yes [provider]     Positive ROS: Otherwise negative  All other systems have been reviewed and were otherwise negative with the exception of those mentioned in the HPI and as above.  Physical Exam: Constitutional: Alert, well-appearing, no acute distress Ears: External ears without lesions or tenderness. Ear canals are small bilaterally with moderate wax buildup in both ear canals that was cleaned with forceps and curettes.  TMs were clear bilaterally.. Nasal: External nose without lesions. Clear nasal passages Oral: Oropharynx clear. Neck: No palpable adenopathy or masses Respiratory: Breathing comfortably  Skin: No facial/neck lesions or rash noted.  Cerumen impaction removal  Date/Time: 02/08/2020 2:38 PM Performed by: Rozetta Nunnery, MD Authorized by: Rozetta Nunnery, MD   Consent:    Consent obtained:  Verbal   Consent given by:  Patient   Risks discussed:  Pain and bleeding Procedure details:    Location:  L ear and R ear   Procedure type: curette and forceps   Post-procedure details:    Inspection:  TM intact and canal normal   Hearing quality:  Improved   Patient tolerance of procedure:  Tolerated well, no immediate complications Comments:     TMs are clear bilaterally.    Assessment: Bilateral cerumen impaction.  Patient wears hearing aids.  Plan: Ears were cleaned in the office. She will follow-up as needed.  Radene Journey, MD

## 2020-03-04 ENCOUNTER — Other Ambulatory Visit: Payer: Self-pay | Admitting: Internal Medicine

## 2020-03-04 DIAGNOSIS — Z1231 Encounter for screening mammogram for malignant neoplasm of breast: Secondary | ICD-10-CM

## 2020-03-04 DIAGNOSIS — Z0000 Encounter for general adult medical examination without abnormal findings: Secondary | ICD-10-CM

## 2020-03-08 ENCOUNTER — Other Ambulatory Visit: Payer: Self-pay | Admitting: Internal Medicine

## 2020-03-08 DIAGNOSIS — Z13820 Encounter for screening for osteoporosis: Secondary | ICD-10-CM

## 2020-05-23 ENCOUNTER — Ambulatory Visit
Admission: RE | Admit: 2020-05-23 | Discharge: 2020-05-23 | Disposition: A | Discharge status: Home / Self Care | Payer: PPO | Source: Ambulatory Visit | Attending: Internal Medicine | Admitting: Internal Medicine

## 2020-05-23 ENCOUNTER — Other Ambulatory Visit: Payer: Self-pay

## 2020-05-23 DIAGNOSIS — M8589 Other specified disorders of bone density and structure, multiple sites: Secondary | ICD-10-CM | POA: Diagnosis not present

## 2020-05-23 DIAGNOSIS — Z1231 Encounter for screening mammogram for malignant neoplasm of breast: Secondary | ICD-10-CM | POA: Diagnosis not present

## 2020-05-23 DIAGNOSIS — Z13820 Encounter for screening for osteoporosis: Secondary | ICD-10-CM

## 2020-05-23 DIAGNOSIS — Z780 Asymptomatic menopausal state: Secondary | ICD-10-CM | POA: Diagnosis not present

## 2020-05-31 ENCOUNTER — Other Ambulatory Visit: Payer: Self-pay | Admitting: Internal Medicine

## 2020-05-31 DIAGNOSIS — R928 Other abnormal and inconclusive findings on diagnostic imaging of breast: Secondary | ICD-10-CM

## 2020-06-18 ENCOUNTER — Ambulatory Visit
Admission: RE | Admit: 2020-06-18 | Discharge: 2020-06-18 | Disposition: A | Discharge status: Home / Self Care | Payer: PPO | Source: Ambulatory Visit | Attending: Internal Medicine | Admitting: Internal Medicine

## 2020-06-18 ENCOUNTER — Other Ambulatory Visit: Payer: Self-pay

## 2020-06-18 DIAGNOSIS — R928 Other abnormal and inconclusive findings on diagnostic imaging of breast: Secondary | ICD-10-CM

## 2020-06-18 DIAGNOSIS — N6489 Other specified disorders of breast: Secondary | ICD-10-CM | POA: Diagnosis not present

## 2020-07-19 ENCOUNTER — Encounter (INDEPENDENT_AMBULATORY_CARE_PROVIDER_SITE_OTHER): Payer: Self-pay | Admitting: Otolaryngology

## 2020-07-19 ENCOUNTER — Other Ambulatory Visit: Payer: Self-pay

## 2020-07-19 ENCOUNTER — Ambulatory Visit (INDEPENDENT_AMBULATORY_CARE_PROVIDER_SITE_OTHER): Payer: PPO | Admitting: Otolaryngology

## 2020-07-19 VITALS — Temp 97.2°F

## 2020-07-19 DIAGNOSIS — J310 Chronic rhinitis: Secondary | ICD-10-CM | POA: Diagnosis not present

## 2020-07-19 DIAGNOSIS — H6123 Impacted cerumen, bilateral: Secondary | ICD-10-CM | POA: Diagnosis not present

## 2020-07-19 NOTE — Progress Notes (Signed)
HPI: [name] is a 74 y.o. female who returns today for evaluation of wax buildup in her ears.  She wears bilateral hearing aids.  She also has history of allergies and has been taking a half of Allegra-D.  Her fall allergies are a little bit more severe this year.  She does have Flonase at home..  Past Medical History:  Diagnosis Date  . Arthritis   . Breast cancer ([place]) 2011   right invasive cancer  . Colon polyps 2001  . Complex endometrial hyperplasia without atypia 2000  . Hearing loss 2005   Right ear, 60%  . Herniated disc 2003   L5-SI  . Hormone replacement therapy (postmenopausal)    2001- DC 2012 with cancer diagnosis  . Menopause   . Osteoporosis   . Pap smear abnormality of cervix with ASCUS favoring benign 03/2015   HR HPV negative  . Personal history of chemotherapy   . Psoriasis   . S/P breast augmentation 1987   Past Surgical History:  Procedure Laterality Date  . APPENDECTOMY  1983   LSO- Serous cystoadenoma  . BREAST SURGERY Bilateral 1987   bilateral breast augmentation  . BREAST SURGERY  03/13/11   right mastectomy-sln,ERPR+,Her2-, Reconstruction  . left index finger fusion     10/07; 3/08  . MASTECTOMY Right   . SALPINGECTOMY Right 1970's   Ectopic  . TUBAL LIGATION     Social History   Socioeconomic History  . Marital status: Married    Spouse name: Not on file  . Number of children: Not on file  . Years of education: Not on file  . Highest education level: Not on file  Occupational History  . Not on file  Tobacco Use  . Smoking status: Current Every Day Smoker    Packs/day: 0.50    Years: 53.00    Pack years: 26.50    Types: Cigarettes    Start date: 81  . Smokeless tobacco: Never Used  Vaping Use  . Vaping Use: Never used  Substance and Sexual Activity  . Alcohol use: Yes    Alcohol/week: 10.0 standard drinks    Types: 10 Standard drinks or equivalent per week  . Drug use: No  . Sexual activity: Not Currently     Partners: Male    Birth control/protection: Post-menopausal, Surgical    Comment: tubal ligation  Other Topics Concern  . Not on file  Social History Narrative  . Not on file   Social Determinants of Health   Financial Resource Strain:   . Difficulty of Paying Living Expenses: Not on file  Food Insecurity:   . Worried About Charity fundraiser in the Last Year: Not on file  . Ran Out of Food in the Last Year: Not on file  Transportation Needs:   . Lack of Transportation (Medical): Not on file  . Lack of Transportation (Non-Medical): Not on file  Physical Activity:   . Days of Exercise per Week: Not on file  . Minutes of Exercise per Session: Not on file  Stress:   . Feeling of Stress : Not on file  Social Connections:   . Frequency of Communication with Friends and Family: Not on file  . Frequency of Social Gatherings with Friends and Family: Not on file  . Attends Religious Services: Not on file  . Active Member of Clubs or Organizations: Not on file  . Attends Archivist Meetings: Not on file  . Marital Status: Not on  file   Family History  Problem Relation Age of Onset  . Breast cancer Mother 43  . Lupus Sister        pt's twin  . Pancreatic cancer Father   . Cancer - Other Brother        oral cancer- resolved  . Dementia Brother   . Heart failure Maternal Grandmother   . Heart failure Maternal Grandfather   . Diabetes Paternal Grandmother   . Heart failure Paternal Grandfather   . Rheum arthritis Sister    Allergies  Allergen Reactions  . Dilaudid [Hydromorphone Hcl] Nausea And Vomiting and Other (See Comments)    Gastrointestinal distress  . Sulfa Antibiotics Nausea And Vomiting and Other (See Comments)    Gastrointestinal distress   Prior to Admission medications   Medication Sig Start Date End Date Taking? Authorizing Provider  Cholecalciferol (VITAMIN D-3) 1000 UNITS CAPS Take by mouth.   Yes [provider]  doxylamine, Sleep,  (UNISOM) 25 MG tablet Take 25 mg by mouth at bedtime as needed.   Yes [provider]  Flaxseed, Linseed, (FLAX SEEDS PO) Take by mouth.   Yes [provider]  folic acid (FOLVITE) 1 MG tablet Take 1 mg by mouth. 11/17/16  Yes [provider]  ibuprofen (ADVIL,MOTRIN) 800 MG tablet Take by mouth as needed.    Yes [provider]  MELATONIN PO Take by mouth.   Yes [provider]  Misc Natural Products (OSTEO BI-FLEX ADV TRIPLE ST PO) Take by mouth.   Yes [provider]  Multiple Vitamins-Minerals (PRESERVISION AREDS 2 PO) Take by mouth.   Yes [provider]  Probiotic Product (PROBIOTIC PO) Take by mouth.   Yes [provider]  venlafaxine XR (EFFEXOR-XR) 75 MG 24 hr capsule Take 75 mg by mouth daily with breakfast.   Yes [provider]     Positive ROS: Otherwise negative  All other systems have been reviewed and were otherwise negative with the exception of those mentioned in the HPI and as above.  Physical Exam: Constitutional: Alert, well-appearing, no acute distress Ears: External ears without lesions or tenderness. Ear canals are on the smaller side and she had a mild amount of wax in both ear canals that was cleaned in the office using curette and suction.  TMs were otherwise clear. Nasal: External nose without lesions. Septum midline with mild rhinitis.  Both middle meatus regions were clear.  No polyps noted..  Oral: Lips and gums without lesions. Tongue and palate mucosa without lesions. Posterior oropharynx clear. Neck: No palpable adenopathy or masses Respiratory: Breathing comfortably  Skin: No facial/neck lesions or rash noted.  Cerumen impaction removal  Date/Time: 07/19/2020 3:07 PM Performed by: Rozetta Nunnery, MD Authorized by: Rozetta Nunnery, MD   Consent:    Consent obtained:  Verbal   Consent given by:  Patient   Risks discussed:  Pain and bleeding Procedure  details:    Location:  L ear and R ear   Procedure type: curette and suction   Post-procedure details:    Inspection:  TM intact and canal normal   Hearing quality:  Improved   Patient tolerance of procedure:  Tolerated well, no immediate complications Comments:     TMs are clear bilaterally.    Assessment: Bilateral cerumen buildup in patient who wears hearing aids Allergic rhinitis  Plan: For allergies recommended plain Zyrtec Allegra or Claritin and not the D variety. Also recommended use of Flonase at night on  a regular basis as this will help with allergies. She will follow-up as needed for wax buildup.   Radene Journey, MD

## 2020-07-23 DIAGNOSIS — E785 Hyperlipidemia, unspecified: Secondary | ICD-10-CM | POA: Diagnosis not present

## 2020-07-29 DIAGNOSIS — E785 Hyperlipidemia, unspecified: Secondary | ICD-10-CM | POA: Diagnosis not present

## 2020-07-29 DIAGNOSIS — Z853 Personal history of malignant neoplasm of breast: Secondary | ICD-10-CM | POA: Diagnosis not present

## 2020-07-29 DIAGNOSIS — R82998 Other abnormal findings in urine: Secondary | ICD-10-CM | POA: Diagnosis not present

## 2020-07-29 DIAGNOSIS — Z0000 Encounter for general adult medical examination without abnormal findings: Secondary | ICD-10-CM | POA: Diagnosis not present

## 2020-07-29 DIAGNOSIS — G4700 Insomnia, unspecified: Secondary | ICD-10-CM | POA: Diagnosis not present

## 2020-07-30 ENCOUNTER — Other Ambulatory Visit: Payer: Self-pay | Admitting: Internal Medicine

## 2020-07-30 DIAGNOSIS — Z0000 Encounter for general adult medical examination without abnormal findings: Secondary | ICD-10-CM

## 2020-12-16 ENCOUNTER — Encounter (INDEPENDENT_AMBULATORY_CARE_PROVIDER_SITE_OTHER): Payer: Self-pay | Admitting: Otolaryngology

## 2020-12-16 ENCOUNTER — Other Ambulatory Visit: Payer: Self-pay

## 2020-12-16 ENCOUNTER — Ambulatory Visit (INDEPENDENT_AMBULATORY_CARE_PROVIDER_SITE_OTHER): Payer: PPO | Admitting: Otolaryngology

## 2020-12-16 VITALS — Temp 97.2°F

## 2020-12-16 DIAGNOSIS — H6123 Impacted cerumen, bilateral: Secondary | ICD-10-CM

## 2020-12-16 DIAGNOSIS — H903 Sensorineural hearing loss, bilateral: Secondary | ICD-10-CM

## 2020-12-16 NOTE — Progress Notes (Signed)
HPI: [name] is a 75 y.o. female who presents for evaluation of wax buildup in her ears.  She wears bilateral hearing aids.  She was last cleaned in November of last year..  Past Medical History:  Diagnosis Date  . Arthritis   . Breast cancer ([place]) 2011   right invasive cancer  . Colon polyps 2001  . Complex endometrial hyperplasia without atypia 2000  . Hearing loss 2005   Right ear, 60%  . Herniated disc 2003   L5-SI  . Hormone replacement therapy (postmenopausal)    2001- DC 2012 with cancer diagnosis  . Menopause   . Osteoporosis   . Pap smear abnormality of cervix with ASCUS favoring benign 03/2015   HR HPV negative  . Personal history of chemotherapy   . Psoriasis   . S/P breast augmentation 1987   Past Surgical History:  Procedure Laterality Date  . APPENDECTOMY  1983   LSO- Serous cystoadenoma  . BREAST SURGERY Bilateral 1987   bilateral breast augmentation  . BREAST SURGERY  03/13/11   right mastectomy-sln,ERPR+,Her2-, Reconstruction  . left index finger fusion     10/07; 3/08  . MASTECTOMY Right   . SALPINGECTOMY Right 1970's   Ectopic  . TUBAL LIGATION     Social History   Socioeconomic History  . Marital status: Married    Spouse name: Not on file  . Number of children: Not on file  . Years of education: Not on file  . Highest education level: Not on file  Occupational History  . Not on file  Tobacco Use  . Smoking status: Current Every Day Smoker    Packs/day: 0.50    Years: 53.00    Pack years: 26.50    Types: Cigarettes    Start date: 83  . Smokeless tobacco: Never Used  Vaping Use  . Vaping Use: Never used  Substance and Sexual Activity  . Alcohol use: Yes    Alcohol/week: 10.0 standard drinks    Types: 10 Standard drinks or equivalent per week  . Drug use: No  . Sexual activity: Not Currently    Partners: Male    Birth control/protection: Post-menopausal, Surgical    Comment: tubal ligation  Other Topics Concern  . Not on  file  Social History Narrative  . Not on file   Social Determinants of Health   Financial Resource Strain: Not on file  Food Insecurity: Not on file  Transportation Needs: Not on file  Physical Activity: Not on file  Stress: Not on file  Social Connections: Not on file   Family History  Problem Relation Age of Onset  . Breast cancer Mother 48  . Lupus Sister        pt's twin  . Pancreatic cancer Father   . Cancer - Other Brother        oral cancer- resolved  . Dementia Brother   . Heart failure Maternal Grandmother   . Heart failure Maternal Grandfather   . Diabetes Paternal Grandmother   . Heart failure Paternal Grandfather   . Rheum arthritis Sister    Allergies  Allergen Reactions  . Dilaudid [Hydromorphone Hcl] Nausea And Vomiting and Other (See Comments)    Gastrointestinal distress  . Sulfa Antibiotics Nausea And Vomiting and Other (See Comments)    Gastrointestinal distress   Prior to Admission medications   Medication Sig Start Date End Date Taking? Authorizing Provider  Cholecalciferol (VITAMIN D-3) 1000 UNITS CAPS Take by mouth.  [provider]  doxylamine, Sleep, (UNISOM) 25 MG tablet Take 25 mg by mouth at bedtime as needed.    [provider]  Flaxseed, Linseed, (FLAX SEEDS PO) Take by mouth.    [provider]  folic acid (FOLVITE) 1 MG tablet Take 1 mg by mouth. 11/17/16   [provider]  ibuprofen (ADVIL,MOTRIN) 800 MG tablet Take by mouth as needed.     [provider]  MELATONIN PO Take by mouth.    [provider]  Misc Natural Products (OSTEO BI-FLEX ADV TRIPLE ST PO) Take by mouth.    [provider]  Multiple Vitamins-Minerals (PRESERVISION AREDS 2 PO) Take by mouth.    [provider]  Probiotic Product (PROBIOTIC PO) Take by mouth.    [provider]  venlafaxine XR (EFFEXOR-XR) 75 MG 24 hr capsule Take 75 mg by mouth daily with breakfast.    [provider]     Positive ROS: Otherwise negative  All other systems have been reviewed and were otherwise negative with the exception of those mentioned in the HPI and as above.  Physical Exam: Constitutional: Alert, well-appearing, no acute distress Ears: External ears without lesions or tenderness. Ear canals are small bilaterally with a mild amount of wax buildup in both ears right side worse than left.  This was cleaned in the office using suction forceps and curettes.  TMs were clear bilaterally.. Nasal: External nose without lesions. Clear nasal passages Oral: Oropharynx clear. Neck: No palpable adenopathy or masses Respiratory: Breathing comfortably  Skin: No facial/neck lesions or rash noted.  Cerumen impaction removal  Date/Time: 12/16/2020 4:52 PM Performed by: Rozetta Nunnery, MD Authorized by: Rozetta Nunnery, MD   Consent:    Consent obtained:  Verbal   Consent given by:  Patient   Risks discussed:  Pain and bleeding Procedure details:    Location:  L ear and R ear   Procedure type: curette, suction and forceps   Post-procedure details:    Inspection:  TM intact and canal normal   Hearing quality:  Improved   Patient tolerance of procedure:  Tolerated well, no immediate complications Comments:     TMs are clear bilaterally.    Assessment: Bilateral cerumen impactions in patient who wears hearing aids.  Plan: This was cleaned in the office using suction and forceps.  TMs are clear bilaterally. She will follow-up as needed.  Radene Journey, MD

## 2020-12-27 ENCOUNTER — Ambulatory Visit (INDEPENDENT_AMBULATORY_CARE_PROVIDER_SITE_OTHER): Payer: PPO | Admitting: Otolaryngology

## 2021-01-02 NOTE — Progress Notes (Signed)
75 y.o. G1P0010 Married White or Caucasian Not Hispanic or Latino female here for breast and pelvic.   H/O right breast cancer, s/p chemo, right mastectomy, breast reconstruction, then treated with letrozole changed to tamoxifen, then Arimidex. No longer on medication.   She still has intermittent hot flashes, overall tolerable.   Not sexually active, h/o severe vaginal atrophy.    Patient's last menstrual period was 02/05/2002 (within weeks).          Sexually active: No.  The current method of family planning is post menopausal status.    Exercising: No.  The patient does not participate in regular exercise at present. Smoker:  Yes just under one pack a day.   Health Maintenance: Pap: 07/08/18 Negative, negative HPV. 05-06-17 neg: 04-13-16 ASCUS HPV HR neg,    04-09-15 ASCUS HPV HR neg,  History of abnormal Pap:  no MMG:  06/18/20 density C Bi-rads 1 neg  BMD:  05/24/20 T score -2, FRAX 11.5/3.8% Done with Primary Colonoscopy: Flex Sig 06/10/12, normal; follow with Air Contrast Barium Enema; 07/22/12 BE: Tortuous colon. No persistent polypoid lesion.Repeat 10 years TDaP:  2014 Gardasil: NA   reports that she has been smoking cigarettes. She started smoking about 55 years ago. She has a 26.50 pack-year smoking history. She has never used smokeless tobacco. She reports current alcohol use of about 10.0 standard drinks of alcohol per week. She reports that she does not use drugs. 3 cups of vodka soda and one glass of wine a day. Her husband doesn't drink. Doesn't effect her functioning or relationships. Just had her 32 rd anniversary, happily married.   Past Medical History:  Diagnosis Date  . Arthritis   . Breast cancer ([place]) 2011   right invasive cancer  . Colon polyps 2001  . Complex endometrial hyperplasia without atypia 2000  . Hearing loss 2005   Right ear, 60%  . Herniated disc 2003   L5-SI  . Hormone replacement therapy (postmenopausal)    2001- DC 2012 with cancer diagnosis   . Menopause   . Osteoporosis   . Pap smear abnormality of cervix with ASCUS favoring benign 03/2015   HR HPV negative  . Personal history of chemotherapy   . Psoriasis   . S/P breast augmentation 1987    Past Surgical History:  Procedure Laterality Date  . APPENDECTOMY  1983   LSO- Serous cystoadenoma  . BREAST SURGERY Bilateral 1987   bilateral breast augmentation  . BREAST SURGERY  03/13/11   right mastectomy-sln,ERPR+,Her2-, Reconstruction  . left index finger fusion     10/07; 3/08  . MASTECTOMY Right   . SALPINGECTOMY Right 1970's   Ectopic  . TUBAL LIGATION      Current Outpatient Medications  Medication Sig Dispense Refill  . Cholecalciferol (VITAMIN D-3) 1000 UNITS CAPS Take by mouth.    . doxylamine, Sleep, (UNISOM) 25 MG tablet Take 25 mg by mouth at bedtime as needed.    . Flaxseed, Linseed, (FLAX SEEDS PO) Take by mouth.    . folic acid (FOLVITE) 1 MG tablet Take 1 mg by mouth.    [name] ibuprofen (ADVIL,MOTRIN) 800 MG tablet Take by mouth as needed.     [name] MELATONIN PO Take by mouth.    . Misc Natural Products (OSTEO BI-FLEX ADV TRIPLE ST PO) Take by mouth.    . Multiple Vitamins-Minerals (PRESERVISION AREDS 2 PO) Take by mouth.    . Probiotic Product (PROBIOTIC PO) Take by mouth.    . venlafaxine  XR (EFFEXOR-XR) 75 MG 24 hr capsule Take 75 mg by mouth daily with breakfast.     No current facility-administered medications for this visit.    Family History  Problem Relation Age of Onset  . Breast cancer Mother 62  . Lupus Sister        pt's twin  . Pancreatic cancer Father   . Cancer - Other Brother        oral cancer- resolved  . Dementia Brother   . Heart failure Maternal Grandmother   . Heart failure Maternal Grandfather   . Diabetes Paternal Grandmother   . Heart failure Paternal Grandfather   . Rheum arthritis Sister     Review of Systems  All other systems reviewed and are negative.   Exam:   LMP 02/05/2002 (Within Weeks)   Weight change:  '@WEIGHTCHANGE' @ Height:      Ht Readings from Last 3 Encounters:  01/03/20 5' 3.75" (1.619 m)  07/08/18 5' 3.25" (1.607 m)  11/03/17 '5\' 3"'  (1.6 m)    General appearance: alert, cooperative and appears stated age Head: Normocephalic, without obvious abnormality, atraumatic Neck: no adenopathy, supple, symmetrical, trachea midline and thyroid normal to inspection and palpation Lungs: clear to auscultation bilaterally Cardiovascular: regular rate and rhythm Breasts: mastectomy on right with breast implant, implant on the left. No masses, no skin changes Abdomen: soft, non-tender; non distended,  no masses,  no organomegaly Extremities: extremities normal, atraumatic, no cyanosis or edema Skin: Skin color, texture, turgor normal. No rashes or lesions Lymph nodes: Cervical, supraclavicular, and axillary nodes normal. No abnormal inguinal nodes palpated Neurologic: Grossly normal   Pelvic: External genitalia:  no lesions              Urethra:  normal appearing urethra with no masses, tenderness or lesions              Bartholins and Skenes: normal                 Vagina: atrophic appearing vagina with normal color and discharge, no lesions              Cervix: no lesions               Bimanual Exam:  Uterus:  normal size, contour, position, consistency, mobility, non-tender              Adnexa: no mass, fullness, tenderness               Rectovaginal: Confirms               Anus:  normal sphincter tone, no lesions  Gae Dry chaperoned for the exam.   1. GYN exam for high-risk Medicare patient No pap needed Mammogram UTD Colon cancer screening due next year  2. Osteopenia with high risk of fracture Reviewed her bone density. T score of -2, but with a 3.8% risk of hip fracture treatment is recommended. Reviewed treatment, no contraindications. She would need up to date labs Will have labs from primary sent over She can either f/u with her primary or here for  treatment Information on osteoporosis and treatment from UTD were printed and given to the patient Other information placed in mychart Discussed exercise, getting adequate calcium and vit d, avoiding falls, working on balance  3. History of breast cancer Mammogram UTD  4. Smoker Encouraged to quit  5. Alcohol consumption heavy Encouraged to cut back, reviewed guidelines Information given   In addition to the breast  and pelvic exam, over 25 minutes was spent in total patient care. Counseling was done osteopenia with an elevated risk of fracture and ETOH use.

## 2021-01-06 ENCOUNTER — Encounter: Payer: Self-pay | Admitting: Obstetrics and Gynecology

## 2021-01-06 ENCOUNTER — Other Ambulatory Visit: Payer: Self-pay

## 2021-01-06 ENCOUNTER — Ambulatory Visit (INDEPENDENT_AMBULATORY_CARE_PROVIDER_SITE_OTHER): Payer: PPO | Admitting: Obstetrics and Gynecology

## 2021-01-06 VITALS — BP 116/72 | HR 110 | Ht 63.25 in | Wt 130.0 lb

## 2021-01-06 DIAGNOSIS — M8580 Other specified disorders of bone density and structure, unspecified site: Secondary | ICD-10-CM

## 2021-01-06 DIAGNOSIS — Z9189 Other specified personal risk factors, not elsewhere classified: Secondary | ICD-10-CM

## 2021-01-06 DIAGNOSIS — Z853 Personal history of malignant neoplasm of breast: Secondary | ICD-10-CM | POA: Diagnosis not present

## 2021-01-06 DIAGNOSIS — F17200 Nicotine dependence, unspecified, uncomplicated: Secondary | ICD-10-CM

## 2021-01-06 DIAGNOSIS — Z789 Other specified health status: Secondary | ICD-10-CM

## 2021-01-06 NOTE — Patient Instructions (Addendum)
EXERCISE   We recommended that you start or continue a regular exercise program for good health. Physical activity is anything that gets your body moving, some is better than none. The CDC recommends 150 minutes per week of Moderate-Intensity Aerobic Activity and 2 or more days of Muscle Strengthening Activity.  Benefits of exercise are limitless: helps weight loss/weight maintenance, improves mood and energy, helps with depression and anxiety, improves sleep, tones and strengthens muscles, improves balance, improves bone density, protects from chronic conditions such as heart disease, high blood pressure and diabetes and so much more. To learn more visit: https://www.cdc.gov/physicalactivity/index.html  DIET: Good nutrition starts with a healthy diet of fruits, vegetables, whole grains, and lean protein sources. Drink plenty of water for hydration. Minimize empty calories, sodium, sweets. For more information about dietary recommendations visit: https://health.gov/our-work/nutrition-physical-activity/dietary-guidelines and https://www.myplate.gov/  ALCOHOL:  Women should limit their alcohol intake to no more than 7 drinks/beers/glasses of wine (combined, not each!) per week. Moderation of alcohol intake to this level decreases your risk of breast cancer and liver damage.  If you are concerned that you may have a problem, or your friends have told you they are concerned about your drinking, there are many resources to help. A well-known program that is free, effective, and available to all people all over the nation is Alcoholics Anonymous.  Check out this site to learn more: https://www.aa.org/   CALCIUM AND VITAMIN D:  Adequate intake of calcium and Vitamin D are recommended for bone health.  You should be getting between 1000-1200 mg of calcium and 800 units of Vitamin D daily between diet and supplements  PAP SMEARS:  Pap smears, to check for cervical cancer or precancers,  have traditionally been  done yearly, scientific advances have shown that most women can have pap smears less often.  However, every woman still should have a physical exam from her gynecologist every year. It will include a breast check, inspection of the vulva and vagina to check for abnormal growths or skin changes, a visual exam of the cervix, and then an exam to evaluate the size and shape of the uterus and ovaries. We will also provide age appropriate advice regarding health maintenance, like when you should have certain vaccines, screening for sexually transmitted diseases, bone density testing, colonoscopy, mammograms, etc.   MAMMOGRAMS:  All women over 40 years old should have a routine mammogram.   COLON CANCER SCREENING: Now recommend starting at age 45. At this time colonoscopy is not covered for routine screening until 50. There are take home tests that can be done between 45-49.   COLONOSCOPY:  Colonoscopy to screen for colon cancer is recommended for all women at age 50.  We know, you hate the idea of the prep.  We agree, BUT, having colon cancer and not knowing it is worse!!  Colon cancer so often starts as a polyp that can be seen and removed at colonscopy, which can quite literally save your life!  And if your first colonoscopy is normal and you have no family history of colon cancer, most women don't have to have it again for 10 years.  Once every ten years, you can do something that may end up saving your life, right?  We will be happy to help you get it scheduled when you are ready.  Be sure to check your insurance coverage so you understand how much it will cost.  It may be covered as a preventative service at no cost, but you should check   your particular policy.      Breast Self-Awareness Breast self-awareness means being familiar with how your breasts look and feel. It involves checking your breasts regularly and reporting any changes to your health care provider. Practicing breast self-awareness is  important. A change in your breasts can be a sign of a serious medical problem. Being familiar with how your breasts look and feel allows you to find any problems early, when treatment is more likely to be successful. All women should practice breast self-awareness, including women who have had breast implants. How to do a breast self-exam One way to learn what is normal for your breasts and whether your breasts are changing is to do a breast self-exam. To do a breast self-exam: Look for Changes  1. Remove all the clothing above your waist. 2. Stand in front of a mirror in a room with good lighting. 3. Put your hands on your hips. 4. Push your hands firmly downward. 5. Compare your breasts in the mirror. Look for differences between them (asymmetry), such as: ? Differences in shape. ? Differences in size. ? Puckers, dips, and bumps in one breast and not the other. 6. Look at each breast for changes in your skin, such as: ? Redness. ? Scaly areas. 7. Look for changes in your nipples, such as: ? Discharge. ? Bleeding. ? Dimpling. ? Redness. ? A change in position. Feel for Changes Carefully feel your breasts for lumps and changes. It is best to do this while lying on your back on the floor and again while sitting or standing in the shower or tub with soapy water on your skin. Feel each breast in the following way:  Place the arm on the side of the breast you are examining above your head.  Feel your breast with the other hand.  Start in the nipple area and make  inch (2 cm) overlapping circles to feel your breast. Use the pads of your three middle fingers to do this. Apply light pressure, then medium pressure, then firm pressure. The light pressure will allow you to feel the tissue closest to the skin. The medium pressure will allow you to feel the tissue that is a little deeper. The firm pressure will allow you to feel the tissue close to the ribs.  Continue the overlapping circles,  moving downward over the breast until you feel your ribs below your breast.  Move one finger-width toward the center of the body. Continue to use the  inch (2 cm) overlapping circles to feel your breast as you move slowly up toward your collarbone.  Continue the up and down exam using all three pressures until you reach your armpit.  Write Down What You Find  Write down what is normal for each breast and any changes that you find. Keep a written record with breast changes or normal findings for each breast. By writing this information down, you do not need to depend only on memory for size, tenderness, or location. Write down where you are in your menstrual cycle, if you are still menstruating. If you are having trouble noticing differences in your breasts, do not get discouraged. With time you will become more familiar with the variations in your breasts and more comfortable with the exam. How often should I examine my breasts? Examine your breasts every month. If you are breastfeeding, the best time to examine your breasts is after a feeding or after using a breast pump. If you menstruate, the best time to   examine your breasts is 5-7 days after your period is over. During your period, your breasts are lumpier, and it may be more difficult to notice changes. When should I see my health care provider? See your health care provider if you notice:  A change in shape or size of your breasts or nipples.  A change in the skin of your breast or nipples, such as a reddened or scaly area.  Unusual discharge from your nipples.  A lump or thick area that was not there before.  Pain in your breasts.  Anything that concerns you.  Osteoporosis  Osteoporosis happens when the bones become thin and less dense than normal. Osteoporosis makes bones more brittle and fragile and more likely to break (fracture). Over time, osteoporosis can cause your bones to become so weak that they fracture after a minor  fall. Bones in the hip, wrist, and spine are most likely to fracture due to osteoporosis. What are the causes? The exact cause of this condition is not known. What increases the risk? You are more likely to develop this condition if you:  Have family members with this condition.  Have poor nutrition.  Use the following: ? Steroid medicines, such as prednisone. ? Anti-seizure medicines. ? Nicotine or tobacco, such as cigarettes, e-cigarettes, and chewing tobacco.  Are female.  Are age 35 or older.  Are not physically active (are sedentary).  Are of European or Asian descent.  Have a small body frame. What are the signs or symptoms? A fracture might be the first sign of osteoporosis, especially if the fracture results from a fall or injury that usually would not cause a bone to break. Other signs and symptoms include:  Pain in the neck or low back.  Stooped posture.  Loss of height. How is this diagnosed? This condition may be diagnosed based on:  Your medical history.  A physical exam.  A bone mineral density test, also called a DXA or DEXA test (dual-energy X-ray absorptiometry test). This test uses X-rays to measure the amount of minerals in your bones. How is this treated? This condition may be treated by:  Making lifestyle changes, such as: ? Including foods with more calcium and vitamin D in your diet. ? Doing weight-bearing and muscle-strengthening exercises. ? Stopping tobacco use. ? Limiting alcohol intake.  Taking medicine to slow the process of bone loss or to increase bone density.  Taking daily supplements of calcium and vitamin D.  Taking hormone replacement medicines, such as estrogen for women and testosterone for men.  Monitoring your levels of calcium and vitamin D. The goal of treatment is to strengthen your bones and lower your risk for a fracture. Follow these instructions at home: Eating and drinking Include calcium and vitamin D in your  diet. Calcium is important for bone health, and vitamin D helps your body absorb calcium. Good sources of calcium and vitamin D include:  Certain fatty fish, such as salmon and tuna.  Products that have calcium and vitamin D added to them (are fortified), such as fortified cereals.  Egg yolks.  Cheese.  Liver.   Activity Do exercises as told by your health care provider. Ask your health care provider what exercises and activities are safe for you. You should do:  Exercises that make you work against gravity (weight-bearing exercises), such as tai chi, yoga, or walking.  Exercises to strengthen muscles, such as lifting weights. Lifestyle  Do not drink alcohol if: ? Your health care provider tells  you not to drink. ? You are pregnant, may be pregnant, or are planning to become pregnant.  If you drink alcohol: ? Limit how much you use to:  0-1 drink a day for women.  0-2 drinks a day for men.  Know how much alcohol is in your drink. In the [place] one drink equals one 12 oz bottle of beer (355 mL), one 5 oz glass of wine (148 mL), or one 1 oz glass of hard liquor (44 mL).  Do not use any products that contain nicotine or tobacco, such as cigarettes, e-cigarettes, and chewing tobacco. If you need help quitting, ask your health care provider. Preventing falls  Use devices to help you move around (mobility aids) as needed, such as canes, walkers, scooters, or crutches.  Keep rooms well-lit and clutter-free.  Remove tripping hazards from walkways, including cords and throw rugs.  Install grab bars in bathrooms and safety rails on stairs.  Use rubber mats in the bathroom and other areas that are often wet or slippery.  Wear closed-toe shoes that fit well and support your feet. Wear shoes that have rubber soles or low heels.  Review your medicines with your health care provider. Some medicines can cause dizziness or changes in blood pressure, which can increase your risk of  falling. General instructions  Take over-the-counter and prescription medicines only as told by your health care provider.  Keep all follow-up visits. This is important. Contact a health care provider if:  You have never been screened for osteoporosis and you are: ? A woman who is age 45 or older. ? A man who is age 40 or older. Get help right away if:  You fall or injure yourself. Summary  Osteoporosis is thinning and loss of density in your bones. This makes bones more brittle and fragile and more likely to break (fracture),even with minor falls.  The goal of treatment is to strengthen your bones and lower your risk for a fracture.  Include calcium and vitamin D in your diet. Calcium is important for bone health, and vitamin D helps your body absorb calcium.  Talk with your health care provider about screening for osteoporosis if you are a woman who is age 62 or older, or a man who is age 41 or older. This information is not intended to replace advice given to you by your health care provider. Make sure you discuss any questions you have with your health care provider. Document Revised: 02/08/2020 Document Reviewed: 02/08/2020 Elsevier Patient Education  2021 [place].  Alcohol Abuse and Dependence Information, Adult Alcohol is a widely available drug. People drink alcohol in different amounts. People who drink alcohol very often and in large amounts often have problems during and after drinking. They may develop what is called an alcohol use disorder. There are two main types of alcohol use disorders:  Alcohol abuse. This is when you use alcohol too much or too often. You may use alcohol to make yourself feel happy or to reduce stress. You may have a hard time setting a limit on the amount you drink.  Alcohol dependence. This is when you use alcohol consistently for a period of time, and your body changes as a result. This can make it hard to stop drinking because you may  start to feel sick or feel different when you do not use alcohol. These symptoms are known as withdrawal. How can alcohol abuse and dependence affect me? Alcohol abuse and dependence can have a negative  effect on your life. Drinking too much can lead to addiction. You may feel like you need alcohol to function normally. You may drink alcohol before work in the morning, during the day, or as soon as you get home from work in the evening. These actions can result in:  Poor work performance.  Job loss.  Financial problems.  Car crashes or criminal charges from driving after drinking alcohol.  Problems in your relationships with friends and family.  Losing the trust and respect of coworkers, friends, and family. Drinking heavily over a long period of time can permanently damage your body and brain, and can cause lifelong health issues, such as:  Damage to your liver or pancreas.  Heart problems, high blood pressure, or stroke.  Certain cancers.  Decreased ability to fight infections.  Brain or nerve damage.  Depression.  Early (premature) death. If you are careless or you crave alcohol, it is easy to drink more than your body can handle (overdose). Alcohol overdose is a serious situation that requires hospitalization. It may lead to permanent injuries or death. What can increase my risk?  Having a family history of alcohol abuse.  Having depression or other mental health conditions.  Beginning to drink at an early age.  Binge drinking often.  Experiencing trauma, stress, and an unstable home life during childhood.  Spending time with people who drink often. What actions can I take to prevent or manage alcohol abuse and dependence?  Do not drink alcohol if: ? Your health care provider tells you not to drink. ? You are pregnant, may be pregnant, or are planning to become pregnant.  If you drink alcohol: ? Limit how much you use to:  0-1 drink a day for women.  0-2  drinks a day for men. ? Be aware of how much alcohol is in your drink. In the [place] one drink equals one 12 oz bottle of beer (355 mL), one 5 oz glass of wine (148 mL), or one 1 oz glass of hard liquor (44 mL).  Stop drinking if you have been drinking too much. This can be very hard to do if you are used to abusing alcohol. If you begin to have withdrawal symptoms, talk with your health care provider or a person that you trust. These symptoms may include anxiety, shaky hands, headache, nausea, sweating, or not being able to sleep.  Choose to drink nonalcoholic beverages in social gatherings and places where there may be alcohol. Activity  Spend more time on activities that you enjoy that do not involve alcohol, like hobbies or exercise.  Find healthy ways to cope with stress, such as exercise, meditation, or spending time with people you care about. General information  Talk to your family, coworkers, and friends about supporting you in your efforts to stop drinking. If they drink, ask them not to drink around you. Spend more time with people who do not drink alcohol.  If you think that you have an alcohol dependency problem: ? Tell friends or family about your concerns. ? Talk with your health care provider or another health professional about where to get help. ? Work with a Transport planner and a Regulatory affairs officer. ? Consider joining a support group for people who struggle with alcohol abuse and dependence. Where to find support  Your health care provider.  SMART Recovery: www.smartrecovery.org Therapy and support groups  Local treatment centers or chemical dependency counselors.  Local AA groups in your community: NicTax.com.pt   Where to  find more information  Centers for Disease Control and Prevention: http://www.wolf.info/  National Institute on Alcohol Abuse and Alcoholism: http://www.bradshaw.com/  Alcoholics Anonymous (AA): NicTax.com.pt Contact a health care provider if:  You drank  more or for longer than you intended on more than one occasion.  You tried to stop drinking or to cut back on how much you drink, but you were not able to.  You often drink to the point of vomiting or passing out.  You want to drink so badly that you cannot think about anything else.  You have problems in your life due to drinking, but you continue to drink.  You keep drinking even though you feel anxious, depressed, or have experienced memory loss.  You have stopped doing the things you used to enjoy in order to drink.  You have to drink more than you used to in order to get the effect you want.  You experience anxiety, sweating, nausea, shakiness, and trouble sleeping when you try to stop drinking. Get help right away if:  You have thoughts about hurting yourself or others.  You have serious withdrawal symptoms, including: ? Confusion. ? Racing heart. ? High blood pressure. ? Fever. If you ever feel like you may hurt yourself or others, or have thoughts about taking your own life, get help right away. You can go to your nearest emergency department or call:  Your local emergency services (911 in the [place]  A suicide crisis helpline, such as the [place] at 503-265-5930. This is open 24 hours a day. Summary  Alcohol abuse and dependence can have a negative effect on your life. Drinking too much or too often can lead to addiction.  If you drink alcohol, limit how much you use.  If you are having trouble keeping your drinking under control, find ways to change your behavior. Hobbies, calming activities, exercise, or support groups can help.  If you feel you need help with changing your drinking habits, talk with your health care provider, a good friend, or a therapist, or go to an [place] group. This information is not intended to replace advice given to you by your health care provider. Make sure you discuss any questions you have with your health  care provider. Document Revised: 12/13/2018 Document Reviewed: 11/01/2018 Elsevier Patient Education  2021 [place] of Quitting Smoking Quitting smoking is a physical and mental challenge. You will face cravings, withdrawal symptoms, and temptation. Before quitting, work with your health care provider to make a plan that can help you manage quitting. Preparation can help you quit and keep you from giving in. How to manage lifestyle changes Managing stress Stress can make you want to smoke, and wanting to smoke may cause stress. It is important to find ways to manage your stress. You might try some of the following:  Practice relaxation techniques. ? Breathe slowly and deeply, in through your nose and out through your mouth. ? Listen to music. ? Soak in a bath or take a shower. ? Imagine a peaceful place or vacation.  Get some support. ? Talk with family or friends about your stress. ? Join a support group. ? Talk with a counselor or therapist.  Get some physical activity. ? Go for a walk, run, or bike ride. ? Play a favorite sport. ? Practice yoga.   Medicines Talk with your health care provider about medicines that might help you deal with cravings and make quitting easier for you.  Relationships Social situations can be difficult when you are quitting smoking. To manage this, you can:  Avoid parties and other social situations where people might be smoking.  Avoid alcohol.  Leave right away if you have the urge to smoke.  Explain to your family and friends that you are quitting smoking. Ask for support and let them know you might be a bit grumpy.  Plan activities where smoking is not an option. General instructions Be aware that many people gain weight after they quit smoking. However, not everyone does. To keep from gaining weight, have a plan in place before you quit and stick to the plan after you quit. Your plan should include:  Having  healthy snacks. When you have a craving, it may help to: ? Eat popcorn, carrots, celery, or other cut vegetables. ? Chew sugar-free gum.  Changing how you eat. ? Eat small portion sizes at meals. ? Eat 4-6 small meals throughout the day instead of 1-2 large meals a day. ? Be mindful when you eat. Do not watch television or do other things that might distract you as you eat.  Exercising regularly. ? Make time to exercise each day. If you do not have time for a long workout, do short bouts of exercise for 5-10 minutes several times a day. ? Do some form of strengthening exercise, such as weight lifting. ? Do some exercise that gets your heart beating and causes you to breathe deeply, such as walking fast, running, swimming, or biking. This is very important.  Drinking plenty of water or other low-calorie or no-calorie drinks. Drink 6-8 glasses of water daily.   How to recognize withdrawal symptoms Your body and mind may experience discomfort as you try to get used to not having nicotine in your system. These effects are called withdrawal symptoms. They may include:  Feeling hungrier than normal.  Having trouble concentrating.  Feeling irritable or restless.  Having trouble sleeping.  Feeling depressed.  Craving a cigarette. To manage withdrawal symptoms:  Avoid places, people, and activities that trigger your cravings.  Remember why you want to quit.  Get plenty of sleep.  Avoid coffee and other caffeinated drinks. These may worsen some of your symptoms. These symptoms may surprise you. But be assured that they are normal to have when quitting smoking. How to manage cravings Come up with a plan for how to deal with your cravings. The plan should include the following:  A definition of the specific situation you want to deal with.  An alternative action you will take.  A clear idea for how this action will help.  The name of someone who might help you with this. Cravings  usually last for 5-10 minutes. Consider taking the following actions to help you with your plan to deal with cravings:  Keep your mouth busy. ? Chew sugar-free gum. ? Suck on hard candies or a straw. ? Brush your teeth.  Keep your hands and body busy. ? Change to a different activity right away. ? Squeeze or play with a ball. ? Do an activity or a hobby, such as making bead jewelry, practicing needlepoint, or working with wood. ? Mix up your normal routine. ? Take a short exercise break. Go for a quick walk or run up and down stairs.  Focus on doing something kind or helpful for someone else.  Call a friend or family member to talk during a craving.  Join a support group.  Contact a quitline. Where to  find support To get help or find a support group:  Call the [facility]'s Smoking Quitline: 1-800-QUIT NOW 276-088-7747)  Visit the website of the Substance Abuse and [place]: ktimeonline.com  Text QUIT to SmokefreeTXT: 454098 Where to find more information Visit these websites to find more information on quitting smoking:  [place]: www.smokefree.gov  American Lung Association: www.lung.org  American Cancer Society: www.cancer.org  Centers for Disease Control and Prevention: http://www.wolf.info/  American Heart Association: www.heart.org Contact a health care provider if:  You want to change your plan for quitting.  The medicines you are taking are not helping.  Your eating feels out of control or you cannot sleep. Get help right away if:  You feel depressed or become very anxious. Summary  Quitting smoking is a physical and mental challenge. You will face cravings, withdrawal symptoms, and temptation to smoke again. Preparation can help you as you go through these challenges.  Try different techniques to manage stress, handle social situations, and prevent weight gain.  You can deal with cravings by keeping your  mouth busy (such as by chewing gum), keeping your hands and body busy, calling family or friends, or contacting a quitline for people who want to quit smoking.  You can deal with withdrawal symptoms by avoiding places where people smoke, getting plenty of rest, and avoiding drinks with caffeine. This information is not intended to replace advice given to you by your health care provider. Make sure you discuss any questions you have with your health care provider. Document Revised: 06/13/2019 Document Reviewed: 06/13/2019 Elsevier Patient Education  [place].

## 2021-01-22 DIAGNOSIS — D0462 Carcinoma in situ of skin of left upper limb, including shoulder: Secondary | ICD-10-CM | POA: Diagnosis not present

## 2021-01-22 DIAGNOSIS — D485 Neoplasm of uncertain behavior of skin: Secondary | ICD-10-CM | POA: Diagnosis not present

## 2021-02-12 IMAGING — MG MM DIGITAL DIAGNOSTIC UNILAT*L* IMPLANT W/ TOMO W/ CAD
6 series · 6 of 14 positions shown · non-contrast
Comparison: Previous exam(s).

CLINICAL DATA: Screening recall for a possible asymmetry in the
left breast.

EXAM:
DIGITAL DIAGNOSTIC LEFT MAMMOGRAM WITH IMPLANTS, CAD AND TOMO
ULTRASOUND LEFT BREAST
The patient has retropectoral implants. Standard and implant
displaced views were performed.

[L MLO]
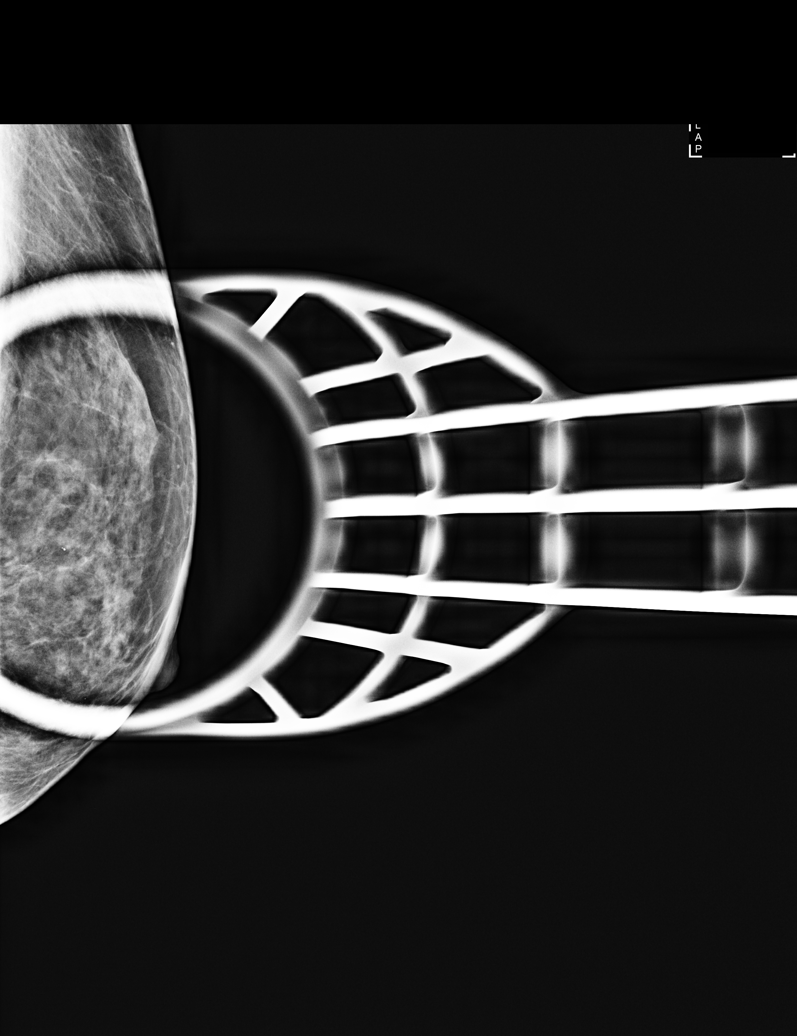

[L CC]
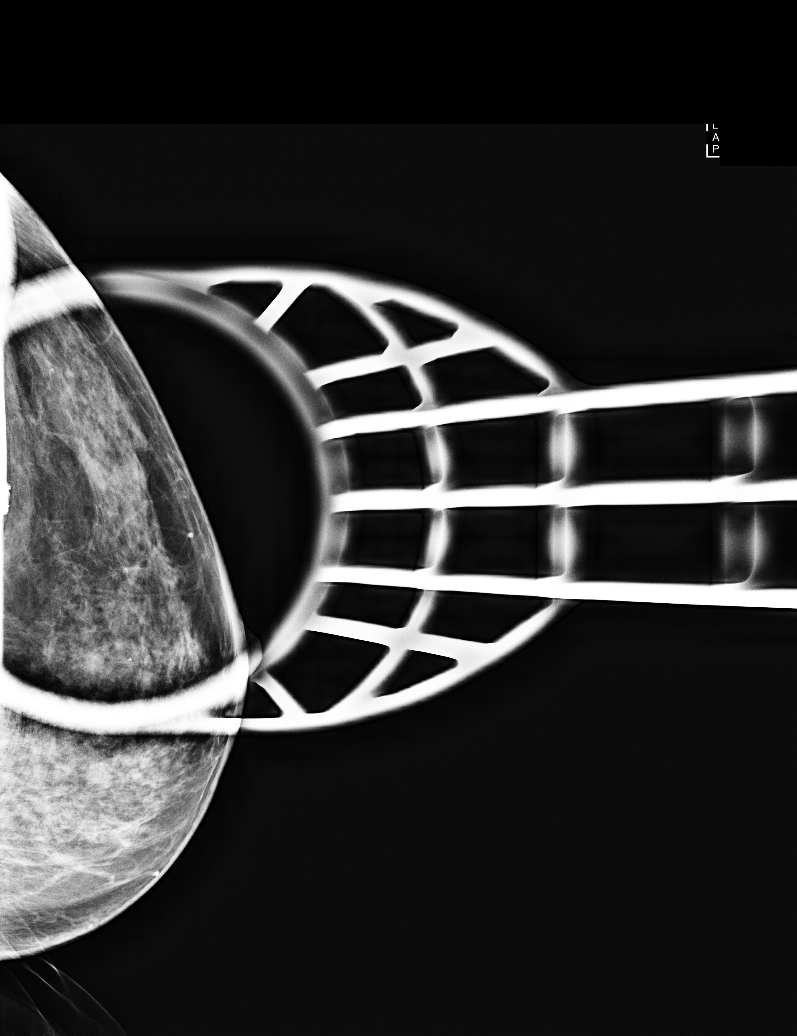

[L CC synth-2D]
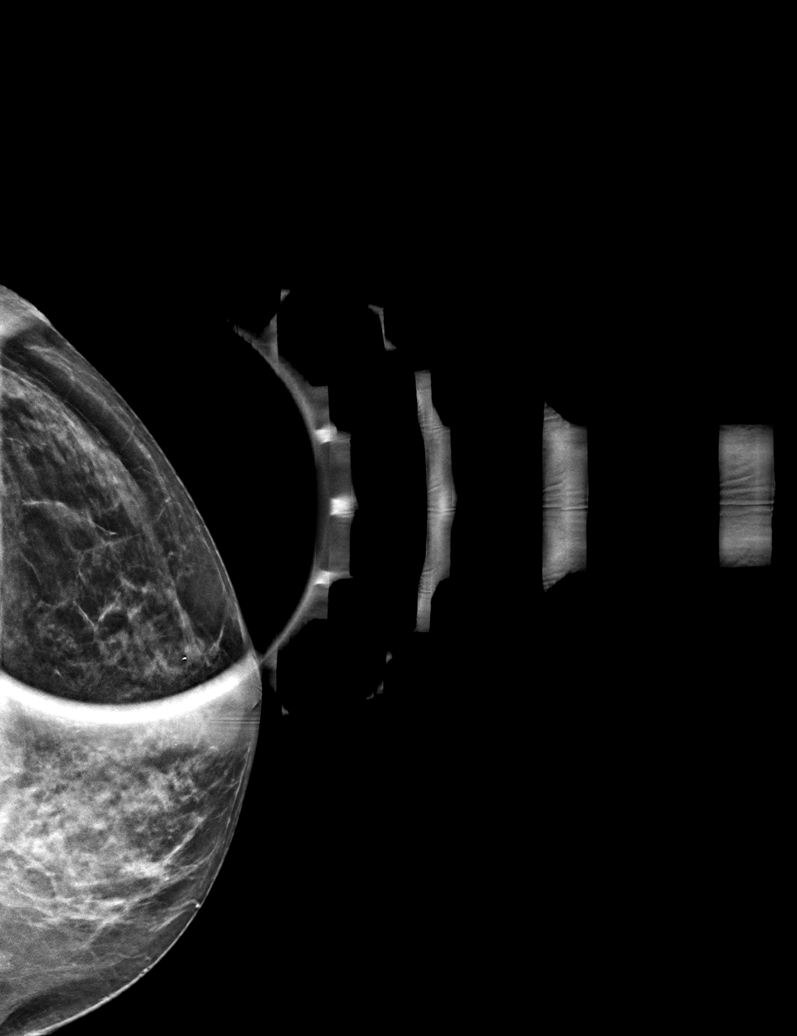

[L MLO synth-2D]
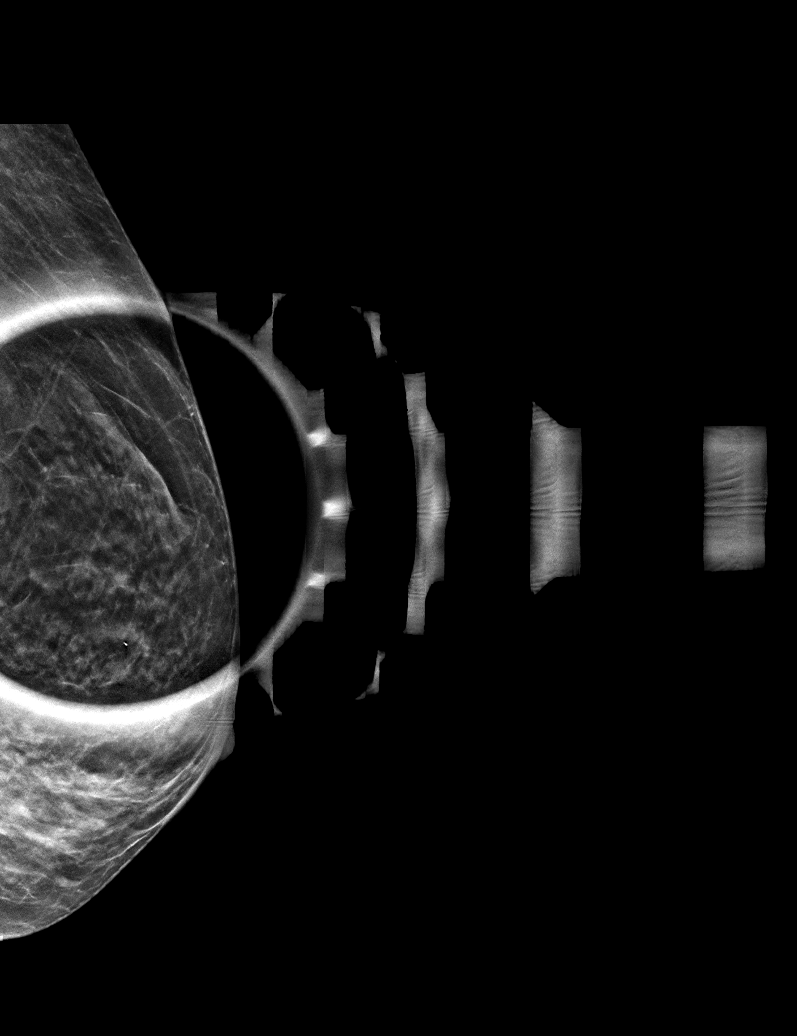

[L CC tomo · tomo slice 21/42.0]
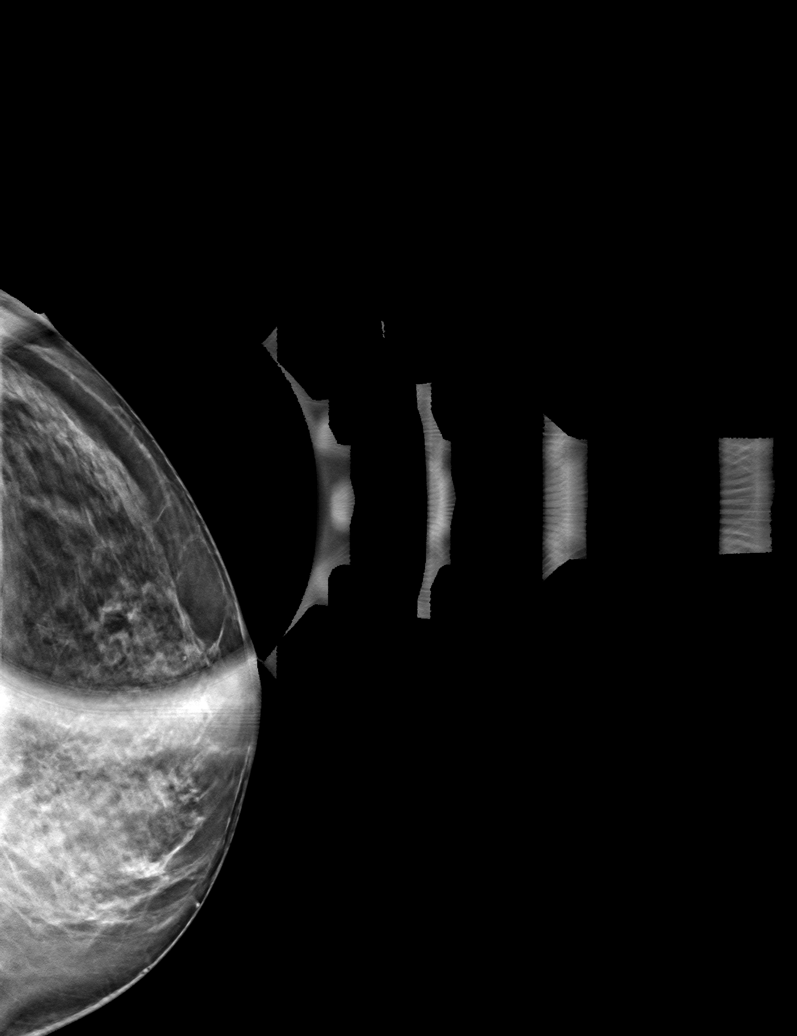

[L MLO tomo · tomo slice 17/34.0]
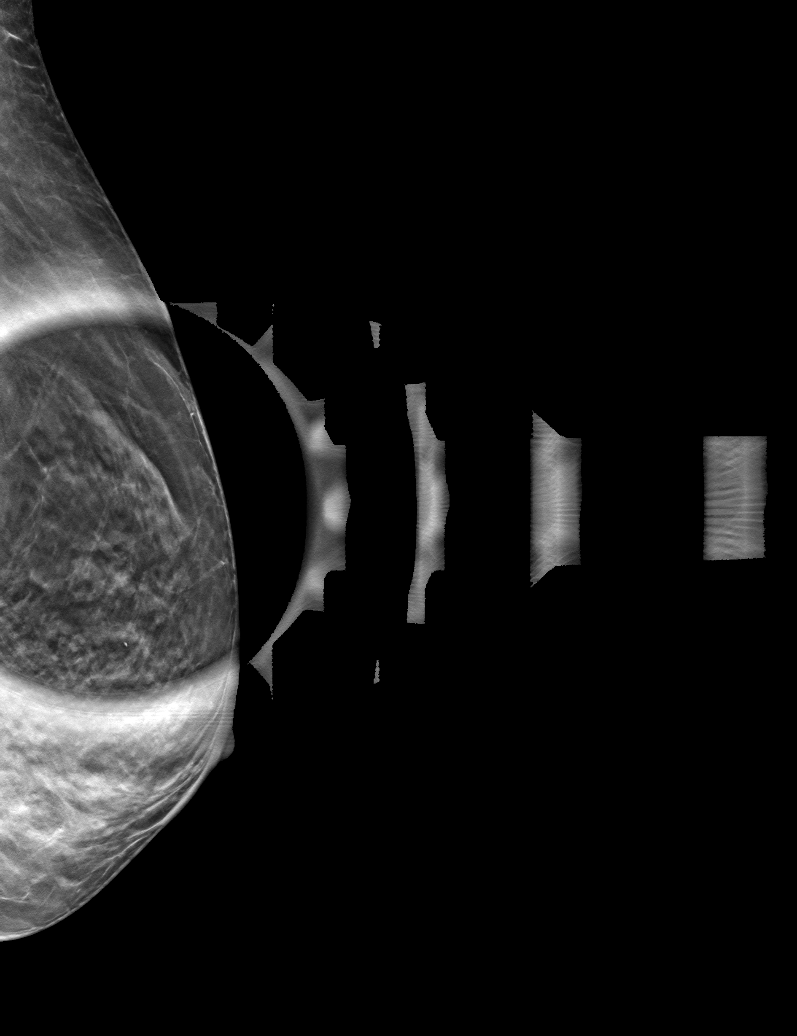

[6 of 14 positions shown; findings below may reference images not displayed]

ACR Breast Density Category c: The breast tissue is heterogeneously
dense, which may obscure small masses.
FINDINGS: The possible asymmetry noted on the current screening exam disperses
with spot compression imaging consistent with normal superimposed
fibroglandular tissue. There is no underlying mass or significant
residual asymmetry. There are no areas of nonsurgical architectural
distortion and there are no suspicious calcifications.

Mammographic images were processed with CAD.

On physical exam, no mass is palpated in the upper outer left
breast.

Targeted ultrasound is performed, showing normal fibroglandular
tissue throughout the upper outer left breast. No mass or suspicious
lesion.
IMPRESSION: No evidence of breast malignancy.

RECOMMENDATION:
Screening mammogram in one year.(Code:[id number])

I have discussed the findings and recommendations with the patient.
If applicable, a reminder letter will be sent to the patient
regarding the next appointment.

BI-RADS CATEGORY  1: Negative.

## 2021-05-13 DIAGNOSIS — H00021 Hordeolum internum right upper eyelid: Secondary | ICD-10-CM | POA: Diagnosis not present

## 2021-05-20 ENCOUNTER — Ambulatory Visit (INDEPENDENT_AMBULATORY_CARE_PROVIDER_SITE_OTHER): Payer: PPO | Admitting: Otolaryngology

## 2021-05-27 DIAGNOSIS — H353131 Nonexudative age-related macular degeneration, bilateral, early dry stage: Secondary | ICD-10-CM | POA: Diagnosis not present

## 2021-06-10 ENCOUNTER — Other Ambulatory Visit: Payer: Self-pay

## 2021-06-10 ENCOUNTER — Ambulatory Visit (INDEPENDENT_AMBULATORY_CARE_PROVIDER_SITE_OTHER): Payer: PPO | Admitting: Otolaryngology

## 2021-06-10 DIAGNOSIS — H903 Sensorineural hearing loss, bilateral: Secondary | ICD-10-CM

## 2021-06-10 DIAGNOSIS — H6123 Impacted cerumen, bilateral: Secondary | ICD-10-CM

## 2021-06-10 NOTE — Progress Notes (Signed)
HPI: [name] is a 75 y.o. female who presents for evaluation of wax buildup in her ears.  She wears bilateral hearing aids..  She was last cleaned about 6 months ago.  She used to be a patient of Dr. Berle Mull.  Past Medical History:  Diagnosis Date   Arthritis    Breast cancer ([place]) 2011   right invasive cancer   Colon polyps 2001   Complex endometrial hyperplasia without atypia 2000   Hearing loss 2005   Right ear, 60%   Herniated disc 2003   L5-SI   Hormone replacement therapy (postmenopausal)    2001- DC 2012 with cancer diagnosis   Menopause    Osteoporosis    Pap smear abnormality of cervix with ASCUS favoring benign 03/2015   HR HPV negative   Personal history of chemotherapy    Psoriasis    S/P breast augmentation 1987   Past Surgical History:  Procedure Laterality Date   APPENDECTOMY  1983   LSO- Serous cystoadenoma   BREAST SURGERY Bilateral 1987   bilateral breast augmentation   BREAST SURGERY  03/13/11   right mastectomy-sln,ERPR+,Her2-, Reconstruction   left index finger fusion     10/07; 3/08   MASTECTOMY Right    SALPINGECTOMY Right 1970's   Ectopic   TUBAL LIGATION     Social History   Socioeconomic History   Marital status: Married    Spouse name: Not on file   Number of children: Not on file   Years of education: Not on file   Highest education level: Not on file  Occupational History   Not on file  Tobacco Use   Smoking status: Every Day    Packs/day: 0.50    Years: 53.00    Pack years: 26.50    Types: Cigarettes    Start date: 1967   Smokeless tobacco: Never  Vaping Use   Vaping Use: Never used  Substance and Sexual Activity   Alcohol use: Yes    Alcohol/week: 10.0 standard drinks    Types: 10 Standard drinks or equivalent per week   Drug use: No   Sexual activity: Not Currently    Partners: Male    Birth control/protection: Post-menopausal, Surgical    Comment: tubal ligation  Other Topics Concern   Not on file   Social History Narrative   Not on file   Social Determinants of Health   Financial Resource Strain: Not on file  Food Insecurity: Not on file  Transportation Needs: Not on file  Physical Activity: Not on file  Stress: Not on file  Social Connections: Not on file   Family History  Problem Relation Age of Onset   Breast cancer Mother 48   Lupus Sister        pt's twin   Pancreatic cancer Father    Cancer - Other Brother        oral cancer- resolved   Dementia Brother    Heart failure Maternal Grandmother    Heart failure Maternal Grandfather    Diabetes Paternal Grandmother    Heart failure Paternal Grandfather    Rheum arthritis Sister    Allergies  Allergen Reactions   Dilaudid [Hydromorphone Hcl] Nausea And Vomiting and Other (See Comments)    Gastrointestinal distress   Sulfa Antibiotics Nausea And Vomiting and Other (See Comments)    Gastrointestinal distress   Prior to Admission medications   Medication Sig Start Date End Date Taking? Authorizing Provider  Cholecalciferol (VITAMIN D-3) 1000 UNITS CAPS  Take by mouth.    [provider]  doxylamine, Sleep, (UNISOM) 25 MG tablet Take 25 mg by mouth at bedtime as needed.    [provider]  Flaxseed, Linseed, (FLAX SEEDS PO) Take by mouth.    [provider]  folic acid (FOLVITE) 1 MG tablet Take 1 mg by mouth. 11/17/16   [provider]  ibuprofen (ADVIL,MOTRIN) 800 MG tablet Take by mouth as needed.     [provider]  MELATONIN PO Take by mouth.    [provider]  Misc Natural Products (OSTEO BI-FLEX ADV TRIPLE ST PO) Take by mouth.    [provider]  Multiple Vitamins-Minerals (PRESERVISION AREDS 2 PO) Take by mouth.    [provider]  Probiotic Product (PROBIOTIC PO) Take by mouth.    [provider]  venlafaxine XR (EFFEXOR-XR) 75 MG 24 hr capsule Take 75 mg by mouth daily with breakfast.    [provider]      Positive ROS: Otherwise negative  All other systems have been reviewed and were otherwise negative with the exception of those mentioned in the HPI and as above.  Physical Exam: Constitutional: Alert, well-appearing, no acute distress Ears: External ears without lesions or tenderness. Ear canals with moderate wax buildup in both ears that was cleaned with curettes and forceps.  Ear canals and TMs are otherwise clear.. Nasal: External nose without lesions. Clear nasal passages Oral: Oropharynx clear. Neck: No palpable adenopathy or masses Respiratory: Breathing comfortably  Skin: No facial/neck lesions or rash noted.  Cerumen impaction removal  Date/Time: 06/10/2021 5:25 PM Performed by: Rozetta Nunnery, MD Authorized by: Rozetta Nunnery, MD   Consent:    Consent obtained:  Verbal   Consent given by:  Patient   Risks discussed:  Pain and bleeding Procedure details:    Location:  L ear and R ear   Procedure type: curette and forceps   Post-procedure details:    Inspection:  TM intact and canal normal   Hearing quality:  Improved   Procedure completion:  Tolerated well, no immediate complications Comments:     TMs are clear bilaterally.  Assessment: Cerumen buildup in patient who wears bilateral hearing aids.  Plan: This was cleaned in the office. She will follow-up with one of the other 2 ENT groups for cleaning as needed.  Radene Journey, MD

## 2021-07-02 DIAGNOSIS — H0015 Chalazion left lower eyelid: Secondary | ICD-10-CM | POA: Diagnosis not present

## 2021-07-02 DIAGNOSIS — H0288B Meibomian gland dysfunction left eye, upper and lower eyelids: Secondary | ICD-10-CM | POA: Diagnosis not present

## 2021-07-02 DIAGNOSIS — H0288A Meibomian gland dysfunction right eye, upper and lower eyelids: Secondary | ICD-10-CM | POA: Diagnosis not present

## 2021-07-02 DIAGNOSIS — H2513 Age-related nuclear cataract, bilateral: Secondary | ICD-10-CM | POA: Diagnosis not present

## 2021-07-15 DIAGNOSIS — Z85828 Personal history of other malignant neoplasm of skin: Secondary | ICD-10-CM | POA: Diagnosis not present

## 2021-07-15 DIAGNOSIS — L821 Other seborrheic keratosis: Secondary | ICD-10-CM | POA: Diagnosis not present

## 2021-08-12 ENCOUNTER — Other Ambulatory Visit: Payer: Self-pay | Admitting: Internal Medicine

## 2021-08-12 DIAGNOSIS — Z1231 Encounter for screening mammogram for malignant neoplasm of breast: Secondary | ICD-10-CM

## 2021-09-16 ENCOUNTER — Ambulatory Visit: Payer: PPO

## 2022-01-08 ENCOUNTER — Ambulatory Visit: Payer: PPO | Admitting: Obstetrics and Gynecology

## 2023-02-25 ENCOUNTER — Encounter (HOSPITAL_COMMUNITY): Payer: Self-pay | Admitting: Gastroenterology

## 2023-02-26 ENCOUNTER — Other Ambulatory Visit (HOSPITAL_COMMUNITY): Payer: Self-pay | Admitting: Gastroenterology

## 2023-02-26 DIAGNOSIS — R1084 Generalized abdominal pain: Secondary | ICD-10-CM

## 2023-02-26 DIAGNOSIS — R197 Diarrhea, unspecified: Secondary | ICD-10-CM

## 2023-03-18 ENCOUNTER — Ambulatory Visit (HOSPITAL_COMMUNITY)
Admission: RE | Admit: 2023-03-18 | Discharge: 2023-03-18 | Disposition: A | Discharge status: Home / Self Care | Payer: PPO | Source: Ambulatory Visit | Attending: Gastroenterology | Admitting: Gastroenterology

## 2023-03-18 DIAGNOSIS — R197 Diarrhea, unspecified: Secondary | ICD-10-CM | POA: Insufficient documentation

## 2023-03-18 DIAGNOSIS — R1084 Generalized abdominal pain: Secondary | ICD-10-CM | POA: Insufficient documentation

## 2023-03-18 MED ORDER — IOHEXOL 350 MG/ML SOLN
75.0000 mL | Freq: Once | INTRAVENOUS | Status: AC | PRN
  Administered 2023-03-18: 75 mL via INTRAVENOUS

## 2023-04-08 ENCOUNTER — Other Ambulatory Visit (HOSPITAL_COMMUNITY): Payer: Self-pay | Admitting: Gastroenterology

## 2023-04-08 DIAGNOSIS — R933 Abnormal findings on diagnostic imaging of other parts of digestive tract: Secondary | ICD-10-CM

## 2023-04-21 ENCOUNTER — Ambulatory Visit (HOSPITAL_COMMUNITY)
Admission: RE | Admit: 2023-04-21 | Discharge: 2023-04-21 | Disposition: A | Discharge status: Home / Self Care | Payer: PPO | Source: Ambulatory Visit | Attending: Gastroenterology | Admitting: Gastroenterology

## 2023-04-21 DIAGNOSIS — R933 Abnormal findings on diagnostic imaging of other parts of digestive tract: Secondary | ICD-10-CM | POA: Insufficient documentation

## 2023-04-21 MED ORDER — IOHEXOL 350 MG/ML SOLN
100.0000 mL | Freq: Once | INTRAVENOUS | Status: AC | PRN
  Administered 2023-04-21: 100 mL via INTRAVENOUS

## 2023-04-21 MED ORDER — BARIUM SULFATE 0.1 % PO SUSP
1350.0000 mL | Freq: Once | ORAL | Status: AC
  Administered 2023-04-21: 1350 mL via ORAL

## 2023-07-15 ENCOUNTER — Other Ambulatory Visit: Payer: Self-pay | Admitting: Orthopaedic Surgery

## 2023-07-15 DIAGNOSIS — S8262XA Displaced fracture of lateral malleolus of left fibula, initial encounter for closed fracture: Secondary | ICD-10-CM

## 2023-07-16 ENCOUNTER — Other Ambulatory Visit: Payer: Self-pay

## 2023-07-16 ENCOUNTER — Encounter (HOSPITAL_COMMUNITY): Payer: Self-pay | Admitting: Orthopaedic Surgery

## 2023-07-16 NOTE — Progress Notes (Signed)
For Anesthesia: PCP - Alysia Penna, MD  Cardiologist - N/A  Chest x-ray - greater than 1 year EKG - N/A Stress Test - N/A ECHO - N/A Cardiac Cath - N/A Pacemaker/ICD device last checked: N/A Pacemaker orders received: N/A Device Rep notified: N/A  Spinal Cord Stimulator: N/A  Sleep Study - N/A CPAP - N/A  Fasting Blood Sugar - N/A Checks Blood Sugar ___N/A__ times a day Date and result of last Hgb A1c- N/A  Last dose of GLP1 agonist- N/A GLP1 instructions: N/A  Last dose of SGLT-2 inhibitors- N/A SGLT-2 instructions:N/A  Blood Thinner Instructions:N/A Aspirin Instructions:N/A Last Dose:N/A  Activity level:  Able to exercise without chest pain and/or shortness of breath    Anesthesia review: N/A  Patient denies shortness of breath, fever, cough and chest pain during pre op phone call.   Patient verbalized understanding of instructions reviewed via telephone.

## 2023-07-17 NOTE — H&P (Signed)
ORTHOPAEDIC SURGERY H&P  Subjective:  The patient presents with left ankle fx.   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, 60%   Herniated disc 2003   L5-SI   Hormone replacement therapy (postmenopausal)    2001- DC 2012 with cancer diagnosis   Menopause    Osteoporosis    Pap smear abnormality of cervix with ASCUS favoring benign 03/2015   HR HPV negative   Personal history of chemotherapy    Psoriasis    S/P breast augmentation 1987    Past Surgical History:  Procedure Laterality Date   APPENDECTOMY  1983   LSO- Serous cystoadenoma   BREAST SURGERY Bilateral 1987   bilateral breast augmentation   BREAST SURGERY  03/13/2011   right mastectomy-sln,ERPR+,Her2-, Reconstruction   COLONOSCOPY     left index finger fusion     10/07; 3/08   MASTECTOMY Right    SALPINGECTOMY Right 1970's   Ectopic   TUBAL LIGATION       (Not in an outpatient encounter)    Allergies  Allergen Reactions   Dilaudid [Hydromorphone Hcl] Nausea And Vomiting and Other (See Comments)    Gastrointestinal distress   Sulfa Antibiotics Nausea And Vomiting and Other (See Comments)    Gastrointestinal distress    Social History   Socioeconomic History   Marital status: Married    Spouse name: Not on file   Number of children: Not on file   Years of education: Not on file   Highest education level: Not on file  Occupational History   Not on file  Tobacco Use   Smoking status: Every Day    Current packs/day: 0.50    Average packs/day: 0.5 packs/day for 57.9 years (28.9 ttl pk-yrs)    Types: Cigarettes    Start date: 47   Smokeless tobacco: Never  Vaping Use   Vaping status: Never Used  Substance and Sexual Activity   Alcohol use: Yes    Alcohol/week: 10.0 standard drinks  of alcohol    Types: 10 Standard drinks or equivalent per week   Drug use: No   Sexual activity: Not Currently    Partners: Male    Birth control/protection: Post-menopausal, Surgical    Comment: tubal ligation  Other Topics Concern   Not on file  Social History Narrative   Not on file   Social Determinants of Health   Financial Resource Strain: Not on file  Food Insecurity: Not on file  Transportation Needs: Not on file  Physical Activity: Not on file  Stress: Not on file  Social Connections: Not on file  Intimate Partner Violence: Not on file     History reviewed. No pertinent family history.   Review of Systems Pertinent items are noted in HPI.  Objective: Vital signs in last 24 hours:    07/16/2023   10:20 AM 01/06/2021    2:30 PM 01/03/2020    3:21 PM  Vitals with BMI  Height 5\' 3"  5' 3.25" 5' 3.75"  Weight 121 lbs 130 lbs 125 lbs 6 oz  BMI 21.44 22.83 21.7  Systolic  116 118  Diastolic  72 68  Pulse  110 88      EXAM: General: Well nourished, well developed. Awake, alert and oriented to  time, place, person. Normal mood and affect. No apparent distress. Breathing room air.  Operative Lower Extremity: Alignment - Neutral Deformity - None Skin intact Tenderness to palpation - left ankle 5/5 TA, PT, GS, Per, EHL, FHL Sensation intact to light touch throughout Palpable DP and PT pulses Special testing: None  The contralateral foot/ankle was examined for comparison and noted to be neurovascularly intact with no localized deformity, swelling, or tenderness.  Imaging Review All images taken were independently reviewed by me.  Assessment/Plan: The clinical and radiographic findings were reviewed and discussed at length with the patient.  The patient has left ankle fx.  We spoke at length about the natural course of these findings. We discussed nonoperative and operative treatment options in detail.  The risks and benefits were presented and reviewed.  The risks due to hardware failure/irritation, new/persistent/recurrent infection, stiffness, nerve/vessel/tendon injury, nonunion/malunion of any fracture, wound healing issues, allograft usage, development of arthritis, failure of this surgery, possibility of external fixation in certain situations, possibility of delayed definitive surgery, need for further surgery, prolonged wound care including further soft tissue coverage procedures, thromboembolic events, anesthesia/medical complications/events perioperatively and beyond, amputation, death among others were discussed. The patient acknowledged the explanation and agreed to proceed with the plan.  [name]  Orthopaedic Surgery [facility]

## 2023-07-17 NOTE — Discharge Instructions (Signed)

## 2023-07-19 ENCOUNTER — Ambulatory Visit (HOSPITAL_COMMUNITY): Payer: PPO

## 2023-07-19 ENCOUNTER — Other Ambulatory Visit: Payer: Self-pay

## 2023-07-19 ENCOUNTER — Encounter (HOSPITAL_COMMUNITY)
Admission: RE | Disposition: A | Discharge status: Home / Self Care | Payer: Self-pay | Source: Home / Self Care | Attending: Orthopaedic Surgery

## 2023-07-19 ENCOUNTER — Ambulatory Visit (HOSPITAL_COMMUNITY)
Admission: RE | Admit: 2023-07-19 | Discharge: 2023-07-19 | Disposition: A | Discharge status: Home / Self Care | Payer: PPO | Attending: Orthopaedic Surgery | Admitting: Orthopaedic Surgery

## 2023-07-19 ENCOUNTER — Encounter (HOSPITAL_COMMUNITY): Payer: Self-pay | Admitting: Orthopaedic Surgery

## 2023-07-19 ENCOUNTER — Ambulatory Visit (HOSPITAL_COMMUNITY): Payer: Self-pay | Admitting: Certified Registered"

## 2023-07-19 ENCOUNTER — Ambulatory Visit (HOSPITAL_BASED_OUTPATIENT_CLINIC_OR_DEPARTMENT_OTHER): Payer: PPO | Admitting: Certified Registered"

## 2023-07-19 DIAGNOSIS — S8262XA Displaced fracture of lateral malleolus of left fibula, initial encounter for closed fracture: Secondary | ICD-10-CM | POA: Insufficient documentation

## 2023-07-19 DIAGNOSIS — X58XXXA Exposure to other specified factors, initial encounter: Secondary | ICD-10-CM | POA: Insufficient documentation

## 2023-07-19 DIAGNOSIS — Z853 Personal history of malignant neoplasm of breast: Secondary | ICD-10-CM | POA: Diagnosis not present

## 2023-07-19 DIAGNOSIS — K219 Gastro-esophageal reflux disease without esophagitis: Secondary | ICD-10-CM | POA: Insufficient documentation

## 2023-07-19 DIAGNOSIS — F17210 Nicotine dependence, cigarettes, uncomplicated: Secondary | ICD-10-CM | POA: Insufficient documentation

## 2023-07-19 DIAGNOSIS — M810 Age-related osteoporosis without current pathological fracture: Secondary | ICD-10-CM | POA: Insufficient documentation

## 2023-07-19 DIAGNOSIS — Z9221 Personal history of antineoplastic chemotherapy: Secondary | ICD-10-CM | POA: Insufficient documentation

## 2023-07-19 HISTORY — PX: ORIF ANKLE FRACTURE: SHX5408

## 2023-07-19 HISTORY — DX: Gastro-esophageal reflux disease without esophagitis: K21.9

## 2023-07-19 LAB — BASIC METABOLIC PANEL
Anion gap: 10 (ref 5–15)
BUN: 21 mg/dL (ref 8–23)
CO2: 22 mmol/L (ref 22–32)
Calcium: 9.5 mg/dL (ref 8.9–10.3)
Chloride: 106 mmol/L (ref 98–111)
Creatinine, Ser: 0.95 mg/dL (ref 0.44–1.00)
GFR, Estimated: >60 mL/min (ref >60)
Glucose, Bld: 100 mg/dL — ABNORMAL HIGH (ref 70–99)
Potassium: 4.6 mmol/L (ref 3.5–5.1)
Sodium: 138 mmol/L (ref 135–145)

## 2023-07-19 LAB — CBC
HCT: 43.7 % (ref 36.0–46.0)
Hemoglobin: 14.3 g/dL (ref 12.0–15.0)
MCH: 32.6 pg (ref 26.0–34.0)
MCHC: 32.7 g/dL (ref 30.0–36.0)
MCV: 99.8 fL (ref 80.0–100.0)
Platelets: 439 10*3/uL — ABNORMAL HIGH (ref 150–400)
RBC: 4.38 MIL/uL (ref 3.87–5.11)
RDW: 14.2 % (ref 11.5–15.5)
WBC: 5.6 10*3/uL (ref 4.0–10.5)
nRBC: 0 % (ref 0.0–0.2)

## 2023-07-19 SURGERY — OPEN REDUCTION INTERNAL FIXATION (ORIF) ANKLE FRACTURE
Anesthesia: General | Site: Ankle | Laterality: Left

## 2023-07-19 MED ORDER — PHENYLEPHRINE 80 MCG/ML (10ML) SYRINGE FOR IV PUSH (FOR BLOOD PRESSURE SUPPORT)
PREFILLED_SYRINGE | INTRAVENOUS | Status: AC
  Filled 2023-07-19: qty 10

## 2023-07-19 MED ORDER — BUPIVACAINE HCL (PF) 0.25 % IJ SOLN
INTRAMUSCULAR | Status: AC
  Filled 2023-07-19: qty 30

## 2023-07-19 MED ORDER — ACETAMINOPHEN 10 MG/ML IV SOLN: 1000.0000 mg | Freq: Once | INTRAVENOUS | Status: DC | PRN

## 2023-07-19 MED ORDER — LIDOCAINE HCL (PF) 2 % IJ SOLN
INTRAMUSCULAR | Status: AC
  Filled 2023-07-19: qty 5

## 2023-07-19 MED ORDER — CEFAZOLIN SODIUM-DEXTROSE 2-4 GM/100ML-% IV SOLN
2.0000 g | INTRAVENOUS | Status: AC
Start: 2023-07-19 — End: 2023-07-19
  Administered 2023-07-19: 2 g via INTRAVENOUS
  Filled 2023-07-19: qty 100

## 2023-07-19 MED ORDER — EPHEDRINE SULFATE-NACL 50-0.9 MG/10ML-% IV SOSY
PREFILLED_SYRINGE | INTRAVENOUS | Status: DC | PRN
  Administered 2023-07-19: 5 mg via INTRAVENOUS
  Administered 2023-07-19: 10 mg via INTRAVENOUS
  Administered 2023-07-19: 5 mg via INTRAVENOUS

## 2023-07-19 MED ORDER — OXYCODONE HCL 5 MG/5ML PO SOLN: 5.0000 mg | Freq: Once | ORAL | Status: DC | PRN

## 2023-07-19 MED ORDER — ROPIVACAINE HCL 5 MG/ML IJ SOLN
INTRAMUSCULAR | Status: DC | PRN
  Administered 2023-07-19: 30 mL via PERINEURAL

## 2023-07-19 MED ORDER — ONDANSETRON HCL 4 MG/2ML IJ SOLN
INTRAMUSCULAR | Status: DC | PRN
  Administered 2023-07-19: 4 mg via INTRAVENOUS

## 2023-07-19 MED ORDER — DEXAMETHASONE SODIUM PHOSPHATE 10 MG/ML IJ SOLN
INTRAMUSCULAR | Status: AC
  Filled 2023-07-19: qty 1

## 2023-07-19 MED ORDER — ONDANSETRON HCL 4 MG/2ML IJ SOLN: 4.0000 mg | Freq: Once | INTRAMUSCULAR | Status: DC | PRN

## 2023-07-19 MED ORDER — FENTANYL CITRATE (PF) 100 MCG/2ML IJ SOLN
INTRAMUSCULAR | Status: AC
  Filled 2023-07-19: qty 2

## 2023-07-19 MED ORDER — 0.9 % SODIUM CHLORIDE (POUR BTL) OPTIME
TOPICAL | Status: DC | PRN
  Administered 2023-07-19: 1000 mL

## 2023-07-19 MED ORDER — VANCOMYCIN HCL 1000 MG IV SOLR
INTRAVENOUS | Status: AC
  Filled 2023-07-19: qty 20

## 2023-07-19 MED ORDER — MIDAZOLAM HCL 2 MG/2ML IJ SOLN: 2.0000 mg | Freq: Once | INTRAMUSCULAR | Status: DC

## 2023-07-19 MED ORDER — ONDANSETRON HCL 4 MG/2ML IJ SOLN
INTRAMUSCULAR | Status: AC
  Filled 2023-07-19: qty 2

## 2023-07-19 MED ORDER — DEXAMETHASONE SODIUM PHOSPHATE 10 MG/ML IJ SOLN
INTRAMUSCULAR | Status: DC | PRN
  Administered 2023-07-19: 10 mg via INTRAVENOUS
  Administered 2023-07-19: 4 mg via INTRAVENOUS

## 2023-07-19 MED ORDER — PROPOFOL 10 MG/ML IV BOLUS
INTRAVENOUS | Status: AC
  Filled 2023-07-19: qty 20

## 2023-07-19 MED ORDER — EPHEDRINE 5 MG/ML INJ
INTRAVENOUS | Status: AC
  Filled 2023-07-19: qty 5

## 2023-07-19 MED ORDER — PHENYLEPHRINE 80 MCG/ML (10ML) SYRINGE FOR IV PUSH (FOR BLOOD PRESSURE SUPPORT)
PREFILLED_SYRINGE | INTRAVENOUS | Status: DC | PRN
  Administered 2023-07-19: 120 ug via INTRAVENOUS
  Administered 2023-07-19 (×2): 80 ug via INTRAVENOUS

## 2023-07-19 MED ORDER — PHENYLEPHRINE HCL-NACL 20-0.9 MG/250ML-% IV SOLN
INTRAVENOUS | Status: DC | PRN
  Administered 2023-07-19: 50 ug/min via INTRAVENOUS

## 2023-07-19 MED ORDER — PROPOFOL 10 MG/ML IV BOLUS
INTRAVENOUS | Status: DC | PRN
  Administered 2023-07-19: 90 mg via INTRAVENOUS

## 2023-07-19 MED ORDER — VANCOMYCIN HCL 1000 MG IV SOLR
INTRAVENOUS | Status: DC | PRN
  Administered 2023-07-19: 1000 mg via TOPICAL

## 2023-07-19 MED ORDER — FENTANYL CITRATE PF 50 MCG/ML IJ SOSY: 25.0000 ug | PREFILLED_SYRINGE | INTRAMUSCULAR | Status: DC | PRN

## 2023-07-19 MED ORDER — CLONIDINE HCL (ANALGESIA) 100 MCG/ML EP SOLN
EPIDURAL | Status: DC | PRN
  Administered 2023-07-19: 100 ug

## 2023-07-19 MED ORDER — OXYCODONE HCL 5 MG PO TABS: 5.0000 mg | ORAL_TABLET | Freq: Once | ORAL | Status: DC | PRN

## 2023-07-19 MED ORDER — LIDOCAINE 2% (20 MG/ML) 5 ML SYRINGE
INTRAMUSCULAR | Status: DC | PRN
  Administered 2023-07-19: 40 mg via INTRAVENOUS

## 2023-07-19 MED ORDER — CHLORHEXIDINE GLUCONATE 0.12 % MT SOLN
15.0000 mL | Freq: Once | OROMUCOSAL | Status: AC
  Administered 2023-07-19: 15 mL via OROMUCOSAL

## 2023-07-19 MED ORDER — CHLORHEXIDINE GLUCONATE 4 % EX SOLN: 60.0000 mL | Freq: Once | CUTANEOUS | Status: DC

## 2023-07-19 MED ORDER — LACTATED RINGERS IV SOLN
INTRAVENOUS | Status: DC
Start: 2023-07-19 — End: 2023-07-19

## 2023-07-19 MED ORDER — FENTANYL CITRATE PF 50 MCG/ML IJ SOSY
100.0000 ug | PREFILLED_SYRINGE | Freq: Once | INTRAMUSCULAR | Status: AC
  Administered 2023-07-19: 50 ug via INTRAVENOUS
  Filled 2023-07-19: qty 2

## 2023-07-19 MED ORDER — FENTANYL CITRATE (PF) 100 MCG/2ML IJ SOLN
INTRAMUSCULAR | Status: DC | PRN
  Administered 2023-07-19: 50 ug via INTRAVENOUS

## 2023-07-19 MED ORDER — ORAL CARE MOUTH RINSE: 15.0000 mL | Freq: Once | OROMUCOSAL | Status: AC

## 2023-07-19 SURGICAL SUPPLY — 64 items
APL PRP STRL LF DISP 70% ISPRP (MISCELLANEOUS) ×4
BAG COUNTER SPONGE SURGICOUNT (BAG) IMPLANT
BAG SPEC THK2 15X12 ZIP CLS (MISCELLANEOUS) ×2
BAG SPNG CNTER NS LX DISP (BAG)
BAG ZIPLOCK 12X15 (MISCELLANEOUS) ×2 IMPLANT
BANDAGE ESMARK 6X9 LF (GAUZE/BANDAGES/DRESSINGS) ×4 IMPLANT
BIT DRILL 2.5X2.75 QC CALB (BIT) ×1 IMPLANT
BIT DRILL 2.9X70 QC CALB (BIT) ×1 IMPLANT
BLADE SURG 15 STRL LF DISP TIS (BLADE) ×4 IMPLANT
BLADE SURG 15 STRL SS (BLADE) ×4
BNDG CMPR 5X4 KNIT ELC UNQ LF (GAUZE/BANDAGES/DRESSINGS) ×2
BNDG CMPR 5X6 CHSV STRCH STRL (GAUZE/BANDAGES/DRESSINGS)
BNDG CMPR 6 X 5 YARDS HK CLSR (GAUZE/BANDAGES/DRESSINGS) ×2
BNDG CMPR 9X4 STRL LF SNTH (GAUZE/BANDAGES/DRESSINGS) ×6
BNDG CMPR 9X6 STRL LF SNTH (GAUZE/BANDAGES/DRESSINGS) ×6
BNDG CMPR MED 10X6 ELC LF (GAUZE/BANDAGES/DRESSINGS) ×2
BNDG COHESIVE 6X5 TAN ST LF (GAUZE/BANDAGES/DRESSINGS) ×1 IMPLANT
BNDG ELASTIC 4INX 5YD STR LF (GAUZE/BANDAGES/DRESSINGS) ×2 IMPLANT
BNDG ELASTIC 6INX 5YD STR LF (GAUZE/BANDAGES/DRESSINGS) ×2 IMPLANT
BNDG ELASTIC 6X10 VLCR STRL LF (GAUZE/BANDAGES/DRESSINGS) ×1 IMPLANT
BNDG ESMARK 4X9 LF (GAUZE/BANDAGES/DRESSINGS) ×3 IMPLANT
BNDG ESMARK 6X9 LF (GAUZE/BANDAGES/DRESSINGS) ×6
BNDG GAUZE DERMACEA FLUFF 4 (GAUZE/BANDAGES/DRESSINGS) ×2 IMPLANT
BNDG GZE DERMACEA 4 6PLY (GAUZE/BANDAGES/DRESSINGS) ×2
CHLORAPREP W/TINT 26 (MISCELLANEOUS) ×4 IMPLANT
COVER SURGICAL LIGHT HANDLE (MISCELLANEOUS) ×2 IMPLANT
CUFF TOURN SGL QUICK 34 (TOURNIQUET CUFF) ×2
CUFF TRNQT CYL 34X4.125X (TOURNIQUET CUFF) ×2 IMPLANT
DRAPE C-ARM 42X120 X-RAY (DRAPES) ×1 IMPLANT
DRAPE C-ARMOR (DRAPES) ×2 IMPLANT
DRAPE EXTREMITY T 121X128X90 (DISPOSABLE) ×2 IMPLANT
DRAPE U-SHAPE 47X51 STRL (DRAPES) ×2 IMPLANT
DRSG MEPITEL 8X12 (GAUZE/BANDAGES/DRESSINGS) ×1 IMPLANT
ELECT REM PT RETURN 15FT ADLT (MISCELLANEOUS) ×2 IMPLANT
GAUZE 4X4 16PLY ~~LOC~~+RFID DBL (SPONGE) ×2 IMPLANT
GAUZE PAD ABD 8X10 STRL (GAUZE/BANDAGES/DRESSINGS) ×8 IMPLANT
GAUZE SPONGE 4X4 12PLY STRL (GAUZE/BANDAGES/DRESSINGS) ×3 IMPLANT
GAUZE XEROFORM 1X8 LF (GAUZE/BANDAGES/DRESSINGS) ×1 IMPLANT
GLOVE BIOGEL PI IND STRL 7.5 (GLOVE) ×3 IMPLANT
GLOVE INDICATOR 7.5 STRL GRN (GLOVE) ×1 IMPLANT
GOWN STRL REUS W/ TWL LRG LVL3 (GOWN DISPOSABLE) ×2 IMPLANT
GOWN STRL REUS W/TWL LRG LVL3 (GOWN DISPOSABLE) ×2
K-WIRE ACE 1.6X6 (WIRE) ×4
KIT BASIN OR (CUSTOM PROCEDURE TRAY) ×2 IMPLANT
KIT TURNOVER KIT A (KITS) IMPLANT
KWIRE ACE 1.6X6 (WIRE) ×2 IMPLANT
NS IRRIG 1000ML POUR BTL (IV SOLUTION) ×2 IMPLANT
PACK TOTAL JOINT (CUSTOM PROCEDURE TRAY) ×2 IMPLANT
PLATE LOCK 8H 103 BILAT FIB (Plate) ×1 IMPLANT
PROTECTOR NERVE ULNAR (MISCELLANEOUS) ×2 IMPLANT
SCREW CORTICAL 3.5MM 12MM (Screw) ×1 IMPLANT
SCREW LOCK 3.5X12 DIST TIB (Screw) ×1 IMPLANT
SCREW LOCK CORT STAR 3.5X12 (Screw) ×3 IMPLANT
SCREW NLOCK CANC HEX 4X55 (Screw) ×1 IMPLANT
SPLINT PLASTER CAST XFAST 5X30 (CAST SUPPLIES) ×1 IMPLANT
STAPLER VISISTAT (STAPLE) ×1 IMPLANT
STOCKINETTE 4X48 STRL (DRAPES) ×2 IMPLANT
SUCTION TUBE FRAZIER 10FR DISP (SUCTIONS) ×2 IMPLANT
SUT ETHILON 3 0 PS 1 (SUTURE) ×2 IMPLANT
SUT VIC AB 2-0 SH 27 (SUTURE) ×2
SUT VIC AB 2-0 SH 27XBRD (SUTURE) ×2 IMPLANT
SUT VIC AB 3-0 SH 27 (SUTURE)
SUT VIC AB 3-0 SH 27X BRD (SUTURE) ×2 IMPLANT
WATER STERILE IRR 1000ML POUR (IV SOLUTION) ×1 IMPLANT

## 2023-07-19 NOTE — Op Note (Signed)
07/19/2023  9:48 AM   PATIENT: [name]  77 y.o. female  MRN: [id number]   PRE-OPERATIVE DIAGNOSIS:   Closed fracture of lateral malleolus of left fibula   POST-OPERATIVE DIAGNOSIS:   Closed fracture of lateral malleolus of left fibula   PROCEDURE: 1] ORIF left bimalleolar ankle fracture (lateral malleolus, anterolateral tibia) 2] ORIF left ankle syndesmosis fixation   SURGEON:  Netta Cedars, MD   ASSISTANT: None   ANESTHESIA: General, regional   EBL: Minimal   TOURNIQUET:    Total Tourniquet Time Documented: Thigh (Left) - 33 minutes Total: Thigh (Left) - 33 minutes    COMPLICATIONS: None apparent   DISPOSITION: Extubated, awake and stable to recovery.   INDICATION FOR PROCEDURE: The patient presented with above diagnosis.  We discussed the diagnosis, alternative treatment options, risks and benefits of the above surgical intervention, as well as alternative non-operative treatments. All questions/concerns were addressed and the patient/family demonstrated appropriate understanding of the diagnosis, the procedure, the postoperative course, and overall prognosis. The patient wished to proceed with surgical intervention and signed an informed surgical consent as such, in each others presence prior to surgery.   PROCEDURE IN DETAIL: After preoperative consent was obtained and the correct operative site was identified, the patient was brought to the operating room supine on stretcher and transferred onto operating table. General anesthesia was induced. Preoperative antibiotics were administered. Surgical timeout was taken. The patient was then positioned supine with an ipsilateral hip bump. The operative lower extremity was prepped and draped in standard sterile fashion with a tourniquet around the thigh. The extremity was exsanguinated and the tourniquet was inflated to 275 mmHg.  A standard lateral incision was made over the distal fibula.  Dissection was carried down to the level of the fibula and the fracture site identified. The superficial peroneal nerve was identified and protected throughout the procedure. The fibula was noted to be shortened with interposed periosteum. The fibula was brought out to length. The fibula fracture was debrided and the edges defined to achieve cortical read. Reduction maneuver was performed using pointed reduction forceps and lobster forceps. In this manner, the fibula length was restored and fracture reduced. A lag screw was not placed given the orientation of fracture lines and comminution. Due to poor bone quality and extensive comminution at the fracture site, it was decided to use a locking distal fibula plate. We then selected a Zimmer locking plate to match the anatomy of the distal fibula and placed it laterally. This was implanted under intraoperative fluoroscopy with a combination of distal locking screws and proximal cortical & locking screws.  We then used a separate window approach to access the distal tibia anterolateral [place] fragment. This was openly reduced and noted to be too comminuted to receive internal fixation without further fragmentation.  A manual external rotation stress radiograph was obtained and demonstrated widening of the ankle mortise. Given this intraoperative finding as well as preoperative subluxation, it was decided to reduce and fix the syndesmosis. Therefore a non locking screw was implanted through the fibula plate in appropriate fashion to fix the syndesmosis. Screw position was verified along anteromedial tibial cortex by fluoroscopy. A repeat stress radiograph showed complete stability of the ankle mortise to testing.  The surgical sites were thoroughly irrigated. The tourniquet was deflated and hemostasis achieved. Betadine and vancomycin powder were applied. The deep layers were closed using 2-0 vicryl. The skin was closed without tension.    The leg was cleaned  with saline and  sterile dressings with gauze were applied. A well padded bulky short leg splint was applied. The patient was awakened from anesthesia and transported to the recovery room in stable condition.    FOLLOW UP PLAN: -transfer to PACU, then home -strict NWB operative extremity, maximum elevation -maintain short leg splint until follow up -DVT ppx: Aspirin 81 mg twice daily while NWB -follow up as outpatient within 7-10 days for wound check with exchange of short leg splint to short leg cast -sutures out in 2-3 weeks in outpatient office   RADIOGRAPHS: AP, lateral, oblique and stress radiographs of the left ankle were obtained intraoperatively. These showed interval reduction and fixation of the fractures. Manual stress radiographs were taken and the joints were noted to be stable following fixation. All hardware is appropriately positioned and of the appropriate lengths. No other acute injuries are noted.   Netta Cedars Orthopaedic Surgery [facility]

## 2023-07-19 NOTE — Anesthesia Preprocedure Evaluation (Signed)
Anesthesia Evaluation  Patient identified by MRN, date of birth, ID band Patient awake    Reviewed: Allergy & Precautions, NPO status , Patient's Chart, lab work & pertinent test results, reviewed documented beta blocker date and time   History of Anesthesia Complications Negative for: history of anesthetic complications  Airway Mallampati: II  TM Distance: >3 FB     Dental no notable dental hx.    Pulmonary neg COPD, Current Smoker and Patient abstained from smoking.   breath sounds clear to auscultation       Cardiovascular Exercise Tolerance: Good (-) hypertension(-) angina (-) CAD, (-) Past MI, (-) CABG, (-) CHF and (-) Orthopnea  Rhythm:Regular Rate:Normal     Neuro/Psych neg Seizures PSYCHIATRIC DISORDERS Anxiety Depression       GI/Hepatic ,GERD  Medicated and Controlled,,(+) neg Cirrhosis        Endo/Other    Renal/GU Renal disease     Musculoskeletal  (+) Arthritis ,    Abdominal   Peds  Hematology   Anesthesia Other Findings   Reproductive/Obstetrics                             Anesthesia Physical Anesthesia Plan  ASA: 2  Anesthesia Plan: General   Post-op Pain Management: Regional block*   Induction: Intravenous  PONV Risk Score and Plan: 2 and Ondansetron and Dexamethasone  Airway Management Planned: LMA  Additional Equipment:   Intra-op Plan:   Post-operative Plan: Extubation in OR  Informed Consent: I have reviewed the patients History and Physical, chart, labs and discussed the procedure including the risks, benefits and alternatives for the proposed anesthesia with the patient or authorized representative who has indicated his/her understanding and acceptance.     Dental advisory given  Plan Discussed with: CRNA  Anesthesia Plan Comments:        Anesthesia Quick Evaluation

## 2023-07-19 NOTE — H&P (Signed)

## 2023-07-19 NOTE — Anesthesia Postprocedure Evaluation (Signed)
Anesthesia Post Note  Patient: KAARI NOVICK  Procedure(s) Performed: OPEN REDUCTION INTERNAL FIXATION (ORIF) LATERAL MALLEOLUS  ANKLE FRACTURE (Left: Ankle)     Patient location during evaluation: PACU Anesthesia Type: General Level of consciousness: awake and alert Pain management: pain level controlled Vital Signs Assessment: post-procedure vital signs reviewed and stable Respiratory status: spontaneous breathing, nonlabored ventilation, respiratory function stable and patient connected to nasal cannula oxygen Cardiovascular status: blood pressure returned to baseline and stable Postop Assessment: no apparent nausea or vomiting Anesthetic complications: no   No notable events documented.  Last Vitals:  Vitals:   07/19/23 1030 07/19/23 1032  BP: 93/63 93/63  Pulse: 99 95  Resp: 16 18  Temp:    SpO2: 94% 93%    Last Pain:  Vitals:   07/19/23 1032  TempSrc:   PainSc: 0-No pain                 Mariann Barter

## 2023-07-19 NOTE — Addendum Note (Signed)
Addendum  created 07/19/23 1212 by Sindy Guadeloupe, CRNA   Intraprocedure Event edited

## 2023-07-19 NOTE — Anesthesia Procedure Notes (Signed)
Anesthesia Regional Block: Popliteal block   Pre-Anesthetic Checklist: , timeout performed,  Correct Patient, Correct Site, Correct Laterality,  Correct Procedure, Correct Position, site marked,  Risks and benefits discussed,  Surgical consent,  Pre-op evaluation,  At surgeon's request and post-op pain management  Laterality: Left  Prep: Maximum Sterile Barrier Precautions used, chloraprep       Needles:  Injection technique: Single-shot  Needle Type: Echogenic Needle      Needle Gauge: 20     Additional Needles:   Procedures:,,,, ultrasound used (permanent image in chart),,    Narrative:  Start time: 07/19/2023 8:10 AM End time: 07/19/2023 8:12 AM Injection made incrementally with aspirations every 5 mL.  Performed by: Personally  Anesthesiologist: Mariann Barter, MD

## 2023-07-19 NOTE — Anesthesia Procedure Notes (Signed)
Procedure Name: LMA Insertion Date/Time: 07/19/2023 8:46 AM  Performed by: Sindy Guadeloupe, CRNAPre-anesthesia Checklist: Patient identified, Emergency Drugs available, Suction available, Patient being monitored and Timeout performed Patient Re-evaluated:Patient Re-evaluated prior to induction Oxygen Delivery Method: Circle system utilized Preoxygenation: Pre-oxygenation with 100% oxygen Induction Type: IV induction Ventilation: Mask ventilation without difficulty LMA: LMA inserted LMA Size: 4.0 Number of attempts: 1 Tube secured with: Tape Dental Injury: Teeth and Oropharynx as per pre-operative assessment

## 2023-07-19 NOTE — Anesthesia Procedure Notes (Signed)
Anesthesia Regional Block: Adductor canal block   Pre-Anesthetic Checklist: , timeout performed,  Correct Patient, Correct Site, Correct Laterality,  Correct Procedure, Correct Position, site marked,  Risks and benefits discussed,  Surgical consent,  Pre-op evaluation,  At surgeon's request and post-op pain management  Laterality: Left  Prep: Maximum Sterile Barrier Precautions used, chloraprep       Needles:  Injection technique: Single-shot  Needle Type: Echogenic Needle      Needle Gauge: 20     Additional Needles:   Procedures:,,,, ultrasound used (permanent image in chart),,    Narrative:  Start time: 07/19/2023 8:13 AM End time: 07/19/2023 8:15 AM Injection made incrementally with aspirations every 5 mL.  Performed by: Personally  Anesthesiologist: Mariann Barter, MD

## 2023-07-19 NOTE — Transfer of Care (Signed)
Immediate Anesthesia Transfer of Care Note  Patient: [name]  Procedure(s) Performed: OPEN REDUCTION INTERNAL FIXATION (ORIF) LATERAL MALLEOLUS  ANKLE FRACTURE (Left: Ankle)  Patient Location: PACU  Anesthesia Type:General  Level of Consciousness: awake, alert , and patient cooperative  Airway & Oxygen Therapy: Patient Spontanous Breathing and Patient connected to face mask oxygen  Post-op Assessment: Report given to RN and Post -op Vital signs reviewed and stable  Post vital signs: Reviewed and stable  Last Vitals:  Vitals Value Taken Time  BP 114/76 07/19/23 0946  Temp    Pulse 95 07/19/23 0948  Resp 21 07/19/23 0948  SpO2 100 % 07/19/23 0948  Vitals shown include unfiled device data.  Last Pain:  Vitals:   07/19/23 0810  TempSrc:   PainSc: 0-No pain         Complications: No notable events documented.

## 2023-07-20 ENCOUNTER — Encounter (HOSPITAL_COMMUNITY): Payer: Self-pay | Admitting: Orthopaedic Surgery

## 2023-11-14 NOTE — Discharge Instructions (Signed)
 Netta Cedars, MD [facility]  Please read the following information regarding your care after surgery.  Medications  You only need a prescription for the narcotic pain medicine (ex. oxycodone, Percocet, Norco).  All of the other medicines listed below are available over the counter. ? Aleve 2 pills twice a day for the first 3 days after surgery. ? acetominophen (Tylenol) 650 mg every 4-6 hours as you need for minor to moderate pain ? oxycodone as prescribed for severe pain  ? To help prevent blood clots, take aspirin (81 mg) twice daily for 28 days after surgery.  You should also get up every hour while you are awake to move around.  Weight Bearing ? OK to walk on the operative leg only AFTER the nerve block has completely worn off.  Cast / Splint / Dressing ? Keep your dressing clean and dry.  Don't put anything (coat hanger, pencil, etc) down inside of it.  If it gets wet, please notify the office immediately.  Swelling IMPORTANT: It is normal for you to have swelling where you had surgery. To reduce swelling and pain, keep at least 3 pillows under your leg so that your toes are above your nose and your heel is above the level of your hip.  It may be necessary to keep your foot or leg elevated for several weeks.  This is critical to helping your incisions heal and your pain to feel better.  Follow Up Call my office at [phone] when you are discharged from the hospital or surgery center to schedule an appointment to be seen within 7-10 days after surgery.  Call my office at [phone] if you develop a fever >101.5 F, nausea, vomiting, bleeding from the surgical site or severe pain.   Post Anesthesia Home Care Instructions  Activity: Get plenty of rest for the remainder of the day. A responsible individual must stay with you for 24 hours following the procedure.  For the next 24 hours, DO NOT: -Drive a car -Advertising copywriter -Drink alcoholic beverages -Take any  medication unless instructed by your physician -Make any legal decisions or sign important papers.  Meals: Start with liquid foods such as gelatin or soup. Progress to regular foods as tolerated. Avoid greasy, spicy, heavy foods. If nausea and/or vomiting occur, drink only clear liquids until the nausea and/or vomiting subsides. Call your physician if vomiting continues.  Special Instructions/Symptoms: Your throat may feel dry or sore from the anesthesia or the breathing tube placed in your throat during surgery. If this causes discomfort, gargle with warm salt water. The discomfort should disappear within 24 hours.  If you had a scopolamine patch placed behind your ear for the management of post- operative nausea and/or vomiting:  1. The medication in the patch is effective for 72 hours, after which it should be removed.  Wrap patch in a tissue and discard in the trash. Wash hands thoroughly with soap and water. 2. You may remove the patch earlier than 72 hours if you experience unpleasant side effects which may include dry mouth, dizziness or visual disturbances. 3. Avoid touching the patch. Wash your hands with soap and water after contact with the patch.    Post Anesthesia Home Care Instructions  Activity: Get plenty of rest for the remainder of the day. A responsible individual must stay with you for 24 hours following the procedure.  For the next 24 hours, DO NOT: -Drive a car -Advertising copywriter -Drink alcoholic beverages -Take any medication unless instructed by your

## 2023-11-14 NOTE — H&P (Signed)
 ORTHOPAEDIC SURGERY H&P  Subjective:  The patient presents with left ankle hardware s/p ORIF.   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, 60%   Herniated disc 2003   L5-SI   Hormone replacement therapy (postmenopausal)    2001- DC 2012 with cancer diagnosis   Menopause    Osteoporosis    Pap smear abnormality of cervix with ASCUS favoring benign 03/2015   HR HPV negative   Personal history of chemotherapy    Psoriasis    S/P breast augmentation 1987    Past Surgical History:  Procedure Laterality Date   APPENDECTOMY  1983   [facility]- Serous cystoadenoma   BREAST SURGERY Bilateral 1987   bilateral breast augmentation   BREAST SURGERY  03/13/2011   right mastectomy-sln,ERPR+,Her2-, Reconstruction   COLONOSCOPY     left index finger fusion     10/07; 3/08   MASTECTOMY Right    ORIF ANKLE FRACTURE Left 07/19/2023   Procedure: OPEN REDUCTION INTERNAL FIXATION (ORIF) LATERAL MALLEOLUS  ANKLE FRACTURE;  Surgeon: Netta Cedars, MD;  Location: [facility];  Service: Orthopedics;  Laterality: Left;  90 MIN   SALPINGECTOMY Right 1970's   Ectopic   TUBAL LIGATION       (Not in an outpatient encounter)    Allergies  Allergen Reactions   Dilaudid [Hydromorphone Hcl] Nausea And Vomiting and Other (See Comments)    Gastrointestinal distress   Sulfa Antibiotics Nausea And Vomiting and Other (See Comments)    Gastrointestinal distress    Social History   Socioeconomic History   Marital status: Married    Spouse name: Not on file   Number of children: Not on file   Years of education: Not on file   Highest education level: Not on file  Occupational History   Not on file  Tobacco Use   Smoking status: Every Day    Current packs/day: 0.50    Average packs/day: 0.5  packs/day for 58.2 years (29.1 ttl pk-yrs)    Types: Cigarettes    Start date: 69   Smokeless tobacco: Never  Vaping Use   Vaping status: Never Used  Substance and Sexual Activity   Alcohol use: Yes    Alcohol/week: 10.0 standard drinks of alcohol    Types: 10 Standard drinks or equivalent per week   Drug use: No   Sexual activity: Not Currently    Partners: Male    Birth control/protection: Post-menopausal, Surgical    Comment: tubal ligation  Other Topics Concern   Not on file  Social History Narrative   Not on file   Social Drivers of Health   Financial Resource Strain: Not on file  Food Insecurity: Not on file  Transportation Needs: Not on file  Physical Activity: Not on file  Stress: Not on file  Social Connections: Not on file  Intimate Partner Violence: Not on file     History reviewed. No pertinent family history.   Review of Systems Pertinent items are noted in HPI.  Objective: Vital signs in last 24 hours:    07/19/2023   10:32 AM 07/19/2023   10:30 AM 07/19/2023   10:15 AM  Vitals with BMI  Systolic 93 93 96  Diastolic 63 63 62  Pulse 95 99 96  EXAM: General: Well nourished, well developed. Awake, alert and oriented to time, place, person. Normal mood and affect. No apparent distress. Breathing room air.  Operative Lower Extremity: Alignment - Neutral Deformity - None Skin intact Tenderness to palpation - None 5/5 TA, PT, GS, Per, EHL, FHL Sensation intact to light touch throughout Palpable DP and PT pulses Special testing: None  The contralateral foot/ankle was examined for comparison and noted to be neurovascularly intact with no localized deformity, swelling, or tenderness.  Imaging Review All images taken were independently reviewed by me.  Assessment/Plan: The clinical and radiographic findings were reviewed and discussed at length with the patient.  The patient has left ankle hardware s/p ORIF.  We spoke at length about  the natural course of these findings. We discussed nonoperative and operative treatment options in detail.  The risks and benefits were presented and reviewed. The risks due to inability to remove part/all of hardware, recurrent instability, hardware failure/irritation, new/persistent/recurrent infection, stiffness, nerve/vessel/tendon injury, nonunion/malunion of any fracture, wound healing issues, allograft usage, development of arthritis, failure of this surgery, possibility of external fixation in certain situations, possibility of delayed definitive surgery, need for further surgery, prolonged wound care including further soft tissue coverage procedures, thromboembolic events, anesthesia/medical complications/events perioperatively and beyond, amputation, death among others were discussed. The patient acknowledged the explanation and agreed to proceed with the plan.  Netta Cedars  Orthopaedic Surgery [facility]

## 2023-11-15 ENCOUNTER — Encounter (HOSPITAL_BASED_OUTPATIENT_CLINIC_OR_DEPARTMENT_OTHER): Payer: Self-pay | Admitting: Orthopaedic Surgery

## 2023-11-17 ENCOUNTER — Ambulatory Visit (HOSPITAL_BASED_OUTPATIENT_CLINIC_OR_DEPARTMENT_OTHER): Admitting: Anesthesiology

## 2023-11-17 ENCOUNTER — Encounter (HOSPITAL_BASED_OUTPATIENT_CLINIC_OR_DEPARTMENT_OTHER): Payer: Self-pay | Admitting: Orthopaedic Surgery

## 2023-11-17 ENCOUNTER — Other Ambulatory Visit: Payer: Self-pay

## 2023-11-17 ENCOUNTER — Encounter (HOSPITAL_BASED_OUTPATIENT_CLINIC_OR_DEPARTMENT_OTHER)
Admission: RE | Disposition: A | Discharge status: Home / Self Care | Payer: Self-pay | Source: Home / Self Care | Attending: Orthopaedic Surgery

## 2023-11-17 ENCOUNTER — Ambulatory Visit (HOSPITAL_BASED_OUTPATIENT_CLINIC_OR_DEPARTMENT_OTHER)

## 2023-11-17 ENCOUNTER — Ambulatory Visit (HOSPITAL_BASED_OUTPATIENT_CLINIC_OR_DEPARTMENT_OTHER)
Admission: RE | Admit: 2023-11-17 | Discharge: 2023-11-17 | Disposition: A | Discharge status: Home / Self Care | Attending: Orthopaedic Surgery | Admitting: Orthopaedic Surgery

## 2023-11-17 DIAGNOSIS — X58XXXD Exposure to other specified factors, subsequent encounter: Secondary | ICD-10-CM | POA: Insufficient documentation

## 2023-11-17 DIAGNOSIS — S8262XD Displaced fracture of lateral malleolus of left fibula, subsequent encounter for closed fracture with routine healing: Secondary | ICD-10-CM | POA: Insufficient documentation

## 2023-11-17 DIAGNOSIS — F17210 Nicotine dependence, cigarettes, uncomplicated: Secondary | ICD-10-CM | POA: Diagnosis not present

## 2023-11-17 DIAGNOSIS — M1990 Unspecified osteoarthritis, unspecified site: Secondary | ICD-10-CM | POA: Insufficient documentation

## 2023-11-17 DIAGNOSIS — Z472 Encounter for removal of internal fixation device: Secondary | ICD-10-CM | POA: Insufficient documentation

## 2023-11-17 DIAGNOSIS — Z01818 Encounter for other preprocedural examination: Secondary | ICD-10-CM

## 2023-11-17 HISTORY — PX: HARDWARE REMOVAL: SHX979

## 2023-11-17 SURGERY — REMOVAL, HARDWARE
Anesthesia: General | Laterality: Left

## 2023-11-17 MED ORDER — ACETAMINOPHEN 10 MG/ML IV SOLN: 1000.0000 mg | Freq: Once | INTRAVENOUS | Status: DC | PRN

## 2023-11-17 MED ORDER — FENTANYL CITRATE (PF) 100 MCG/2ML IJ SOLN
INTRAMUSCULAR | Status: AC
  Filled 2023-11-17: qty 2

## 2023-11-17 MED ORDER — ONDANSETRON HCL 4 MG/2ML IJ SOLN
INTRAMUSCULAR | Status: DC | PRN
  Administered 2023-11-17: 4 mg via INTRAVENOUS

## 2023-11-17 MED ORDER — PROPOFOL 10 MG/ML IV BOLUS
INTRAVENOUS | Status: DC | PRN
  Administered 2023-11-17: 150 mg via INTRAVENOUS

## 2023-11-17 MED ORDER — PHENYLEPHRINE HCL (PRESSORS) 10 MG/ML IV SOLN
INTRAVENOUS | Status: DC | PRN
  Administered 2023-11-17: 80 ug via INTRAVENOUS

## 2023-11-17 MED ORDER — BUPIVACAINE-EPINEPHRINE (PF) 0.5% -1:200000 IJ SOLN
INTRAMUSCULAR | Status: DC | PRN
  Administered 2023-11-17: 10 mL via PERINEURAL

## 2023-11-17 MED ORDER — CEFAZOLIN SODIUM-DEXTROSE 2-4 GM/100ML-% IV SOLN
INTRAVENOUS | Status: AC
  Filled 2023-11-17: qty 100

## 2023-11-17 MED ORDER — PROPOFOL 500 MG/50ML IV EMUL
INTRAVENOUS | Status: DC | PRN
  Administered 2023-11-17: 150 ug/kg/min via INTRAVENOUS

## 2023-11-17 MED ORDER — 0.9 % SODIUM CHLORIDE (POUR BTL) OPTIME
TOPICAL | Status: DC | PRN
  Administered 2023-11-17: 1000 mL

## 2023-11-17 MED ORDER — VANCOMYCIN HCL 500 MG IV SOLR
INTRAVENOUS | Status: DC | PRN
  Administered 2023-11-17: 500 mg

## 2023-11-17 MED ORDER — ONDANSETRON HCL 4 MG/2ML IJ SOLN
INTRAMUSCULAR | Status: AC
  Filled 2023-11-17: qty 2

## 2023-11-17 MED ORDER — FENTANYL CITRATE (PF) 100 MCG/2ML IJ SOLN: 25.0000 ug | INTRAMUSCULAR | Status: DC | PRN

## 2023-11-17 MED ORDER — PROPOFOL 10 MG/ML IV BOLUS
INTRAVENOUS | Status: AC
  Filled 2023-11-17: qty 20

## 2023-11-17 MED ORDER — DEXAMETHASONE SODIUM PHOSPHATE 10 MG/ML IJ SOLN
INTRAMUSCULAR | Status: AC
  Filled 2023-11-17: qty 1

## 2023-11-17 MED ORDER — CEFAZOLIN SODIUM-DEXTROSE 2-4 GM/100ML-% IV SOLN
2.0000 g | INTRAVENOUS | Status: AC
  Administered 2023-11-17: 2 g via INTRAVENOUS

## 2023-11-17 MED ORDER — DEXAMETHASONE SODIUM PHOSPHATE 4 MG/ML IJ SOLN
INTRAMUSCULAR | Status: DC | PRN
  Administered 2023-11-17: 5 mg via INTRAVENOUS

## 2023-11-17 MED ORDER — CHLORHEXIDINE GLUCONATE 4 % EX SOLN: 60.0000 mL | Freq: Once | CUTANEOUS | Status: DC

## 2023-11-17 MED ORDER — MIDAZOLAM HCL 2 MG/2ML IJ SOLN
INTRAMUSCULAR | Status: AC
  Filled 2023-11-17: qty 2

## 2023-11-17 MED ORDER — LIDOCAINE HCL (CARDIAC) PF 100 MG/5ML IV SOSY
PREFILLED_SYRINGE | INTRAVENOUS | Status: DC | PRN
  Administered 2023-11-17: 40 mg via INTRAVENOUS

## 2023-11-17 MED ORDER — POVIDONE-IODINE 10 % EX SOLN
CUTANEOUS | Status: DC | PRN
  Administered 2023-11-17: 1 via TOPICAL

## 2023-11-17 MED ORDER — BUPIVACAINE-EPINEPHRINE (PF) 0.5% -1:200000 IJ SOLN
INTRAMUSCULAR | Status: AC
  Filled 2023-11-17: qty 30

## 2023-11-17 MED ORDER — FENTANYL CITRATE (PF) 100 MCG/2ML IJ SOLN
INTRAMUSCULAR | Status: DC | PRN
Start: 2023-11-17 — End: 2023-11-17
  Administered 2023-11-17 (×2): 25 ug via INTRAVENOUS
  Administered 2023-11-17: 50 ug via INTRAVENOUS

## 2023-11-17 MED ORDER — DEXMEDETOMIDINE HCL IN NACL 80 MCG/20ML IV SOLN
INTRAVENOUS | Status: DC | PRN
  Administered 2023-11-17: 4 ug via INTRAVENOUS

## 2023-11-17 MED ORDER — LIDOCAINE 2% (20 MG/ML) 5 ML SYRINGE
INTRAMUSCULAR | Status: AC
  Filled 2023-11-17: qty 5

## 2023-11-17 MED ORDER — LACTATED RINGERS IV SOLN
INTRAVENOUS | Status: DC
Start: 2023-11-17 — End: 2023-11-17

## 2023-11-17 SURGICAL SUPPLY — 55 items
BANDAGE ESMARK 6X9 LF (GAUZE/BANDAGES/DRESSINGS) ×1 IMPLANT
BLADE SURG 15 STRL LF DISP TIS (BLADE) ×4 IMPLANT
BNDG COHESIVE 4X5 TAN STRL LF (GAUZE/BANDAGES/DRESSINGS) ×1 IMPLANT
BNDG ELASTIC 4INX 5YD STR LF (GAUZE/BANDAGES/DRESSINGS) ×1 IMPLANT
BNDG ELASTIC 6INX 5YD STR LF (GAUZE/BANDAGES/DRESSINGS) IMPLANT
BNDG ESMARK 6X9 LF (GAUZE/BANDAGES/DRESSINGS) IMPLANT
BNDG GAUZE DERMACEA FLUFF 4 (GAUZE/BANDAGES/DRESSINGS) ×1 IMPLANT
BRUSH SCRUB EZ 4% CHG (MISCELLANEOUS) ×1 IMPLANT
CANISTER SUCT 1200ML W/VALVE (MISCELLANEOUS) ×1 IMPLANT
CHLORAPREP W/TINT 26 (MISCELLANEOUS) ×2 IMPLANT
COVER BACK TABLE 60X90IN (DRAPES) ×1 IMPLANT
CUFF TRNQT CYL 34X4.125X (TOURNIQUET CUFF) IMPLANT
DRAPE C-ARM 42X72 X-RAY (DRAPES) IMPLANT
DRAPE C-ARMOR (DRAPES) IMPLANT
DRAPE EXTREMITY T 121X128X90 (DISPOSABLE) ×1 IMPLANT
DRAPE IMP U-DRAPE 54X76 (DRAPES) ×1 IMPLANT
DRAPE OEC MINIVIEW 54X84 (DRAPES) IMPLANT
DRAPE U-SHAPE 47X51 STRL (DRAPES) ×1 IMPLANT
DRSG MEPITEL 4X7.2 (GAUZE/BANDAGES/DRESSINGS) ×1 IMPLANT
ELECT REM PT RETURN 9FT ADLT (ELECTROSURGICAL) ×1 IMPLANT
ELECTRODE REM PT RTRN 9FT ADLT (ELECTROSURGICAL) ×1 IMPLANT
GAUZE PAD ABD 8X10 STRL (GAUZE/BANDAGES/DRESSINGS) IMPLANT
GAUZE SPONGE 4X4 12PLY STRL (GAUZE/BANDAGES/DRESSINGS) ×1 IMPLANT
GLOVE BIOGEL PI IND STRL 8 (GLOVE) ×1 IMPLANT
GLOVE SURG SS PI 7.5 STRL IVOR (GLOVE) ×2 IMPLANT
GOWN STRL REUS W/ TWL LRG LVL3 (GOWN DISPOSABLE) ×2 IMPLANT
MARKER SKIN DUAL TIP RULER LAB (MISCELLANEOUS) IMPLANT
NDL HYPO 25X1 1.5 SAFETY (NEEDLE) IMPLANT
NEEDLE HYPO 25X1 1.5 SAFETY (NEEDLE) ×1 IMPLANT
NS IRRIG 1000ML POUR BTL (IV SOLUTION) ×1 IMPLANT
PACK BASIN DAY SURGERY FS (CUSTOM PROCEDURE TRAY) ×1 IMPLANT
PAD CAST 4YDX4 CTTN HI CHSV (CAST SUPPLIES) ×1 IMPLANT
PADDING CAST ABS COTTON 4X4 ST (CAST SUPPLIES) IMPLANT
PADDING CAST COTTON 6X4 STRL (CAST SUPPLIES) ×1 IMPLANT
PADDING CAST SYNTHETIC 4X4 STR (CAST SUPPLIES) IMPLANT
PENCIL SMOKE EVACUATOR (MISCELLANEOUS) ×1 IMPLANT
SHEET MEDIUM DRAPE 40X70 STRL (DRAPES) ×1 IMPLANT
SLEEVE SCD COMPRESS KNEE MED (STOCKING) ×1 IMPLANT
SPIKE FLUID TRANSFER (MISCELLANEOUS) IMPLANT
SPLINT PLASTER CAST FAST 5X30 (CAST SUPPLIES) IMPLANT
SPONGE T-LAP 18X18 ~~LOC~~+RFID (SPONGE) ×1 IMPLANT
STOCKINETTE 6 STRL (DRAPES) ×1 IMPLANT
STOCKINETTE ORTHO 6X25 (MISCELLANEOUS) ×1 IMPLANT
SUCTION TUBE FRAZIER 10FR DISP (SUCTIONS) IMPLANT
SUT ETHILON 2 0 FS 18 (SUTURE) ×2 IMPLANT
SUT MNCRL AB 3-0 PS2 18 (SUTURE) ×1 IMPLANT
SUT VIC AB 2-0 CT1 TAPERPNT 27 (SUTURE) IMPLANT
SUT VIC AB 2-0 SH 27XBRD (SUTURE) ×1 IMPLANT
SUT VIC AB 3-0 SH 27X BRD (SUTURE) IMPLANT
SUT VICRYL 0 SH 27 (SUTURE) IMPLANT
SYR BULB IRRIG 60ML STRL (SYRINGE) ×1 IMPLANT
SYR CONTROL 10ML LL (SYRINGE) IMPLANT
TOWEL GREEN STERILE FF (TOWEL DISPOSABLE) ×2 IMPLANT
TUBE CONNECTING 20X1/4 (TUBING) IMPLANT
UNDERPAD 30X36 HEAVY ABSORB (UNDERPADS AND DIAPERS) ×1 IMPLANT

## 2023-11-17 NOTE — Op Note (Signed)
 11/17/2023  11:14 PM   PATIENT: [name]  78 y.o. female  MRN: [id number]   PRE-OPERATIVE DIAGNOSIS:   Closed fracture of lateral malleolus of left fibula s/p ORIF   POST-OPERATIVE DIAGNOSIS:   Same   PROCEDURE: Removal of left ankle deep hardware (screws x 2)   SURGEON:  Netta Cedars, MD   ASSISTANT: None   ANESTHESIA: General, regional   EBL: Minimal   TOURNIQUET:    Total Tourniquet Time Documented: Thigh (Left) - 10 minutes Total: Thigh (Left) - 10 minutes    COMPLICATIONS: None apparent   DISPOSITION: Extubated, awake and stable to recovery.   INDICATION FOR PROCEDURE: The patient presented with above diagnosis.  We discussed the diagnosis, alternative treatment options, risks and benefits of the above surgical intervention, as well as alternative non-operative treatments. All questions/concerns were addressed and the patient/family demonstrated appropriate understanding of the diagnosis, the procedure, the postoperative course, and overall prognosis. The patient wished to proceed with surgical intervention and signed an informed surgical consent as such, in each others presence prior to surgery.   PROCEDURE IN DETAIL: After preoperative consent was obtained and the correct operative site was identified, the patient was brought to the operating room supine on stretcher and transferred onto operating table. General anesthesia was induced. Preoperative antibiotics were administered. Surgical timeout was taken. The patient was then positioned supine with an ipsilateral hip bump. The operative lower extremity was prepped and draped in standard sterile fashion with a tourniquet around the thigh. The extremity was exsanguinated and the tourniquet was inflated to 275 mmHg.  Prior lateralapproach was utilized over the distal fibula and dissection carried down to the level of plate. The syndesmosis screw was removed completely and stability of the  anklewas noted to clinical and fluoroscopic testing. The distal most screw in the plate was also explanted due to prominence in this area. The plate was then bent in situ to conform more closely to the distal lateral malleolus anatomy.   The surgical sites were thoroughly irrigated. The tourniquet was deflated and hemostasis achieved. Betadine solution was used to irrigate and vancomycin powder applied. The skin was closed without tension.    The leg was cleaned with saline and sterile mepitel dressings with gauze were applied. A well padded sterile wrap was applied. The patient was awakened from anesthesia and transported to the recovery room in stable condition.    FOLLOW UP PLAN: -transfer to PACU, then home -strict NWB operative extremity until nerve block wears off, then OK to WBAT, maximum elevation -maintain dressings until follow up -DVT ppx: Aspirin 81 mg twice daily while NWB -follow up as outpatient within 7-10 days for wound check -sutures out in 2-3 weeks in outpatient office   RADIOGRAPHS: AP, lateral, oblique and stress radiographs of the left ankle were obtained intraoperatively. These showed interval removal of the syndesmosis screws. Manual stress radiographs were taken and the joints were noted to be stable following hardware removal. No other acute injuries are noted.   Netta Cedars Orthopaedic Surgery [facility]

## 2023-11-17 NOTE — H&P (Signed)
 H&P Update:  -History and Physical Reviewed  -Patient has been re-examined  -No change in the plan of care  -The risks and benefits were presented and reviewed. The risks due to inability to remove part/all of hardware, recurrent instability, hardware/suture failure and/or irritation, new/persistent infection, stiffness, nerve/vessel/tendon injury or rerupture of repaired tendon, nonunion/malunion, allograft usage, wound healing issues, development of arthritis, failure of this surgery, possibility of external fixation with delayed definitive surgery, need for further surgery, thromboembolic events, anesthesia/medical complications, amputation, death among others were discussed. The patient acknowledged the explanation, agreed to proceed with the plan and a consent was signed.  [name]

## 2023-11-17 NOTE — Anesthesia Procedure Notes (Signed)
 Procedure Name: LMA Insertion Date/Time: 11/17/2023 1:34 PM  Performed by: Earmon Phoenix, CRNAPre-anesthesia Checklist: Patient identified, Emergency Drugs available, Suction available, Patient being monitored and Timeout performed Patient Re-evaluated:Patient Re-evaluated prior to induction Oxygen Delivery Method: Circle system utilized Preoxygenation: Pre-oxygenation with 100% oxygen Induction Type: IV induction Ventilation: Mask ventilation without difficulty LMA: LMA inserted LMA Size: 3.0 Number of attempts: 1 Placement Confirmation: positive ETCO2 and breath sounds checked- equal and bilateral Tube secured with: Tape Dental Injury: Teeth and Oropharynx as per pre-operative assessment

## 2023-11-17 NOTE — Anesthesia Postprocedure Evaluation (Signed)
 Anesthesia Post Note  Patient: [name]  Procedure(s) Performed: REMOVAL, HARDWARE (Left)     Patient location during evaluation: PACU Anesthesia Type: General Level of consciousness: awake and alert Pain management: pain level controlled Vital Signs Assessment: post-procedure vital signs reviewed and stable Respiratory status: spontaneous breathing, nonlabored ventilation, respiratory function stable and patient connected to nasal cannula oxygen Cardiovascular status: blood pressure returned to baseline and stable Postop Assessment: no apparent nausea or vomiting Anesthetic complications: no   No notable events documented.  Last Vitals:  Vitals:   11/17/23 1430 11/17/23 1440  BP: 115/74 123/80  Pulse: 83 93  Resp: 16 17  Temp:  36.6 C  SpO2: 94% 96%    Last Pain:  Vitals:   11/17/23 1440  TempSrc: Temporal  PainSc:                  Nelle Don Tujuana Kilmartin

## 2023-11-17 NOTE — Anesthesia Preprocedure Evaluation (Signed)
 Anesthesia Evaluation  Patient identified by MRN, date of birth, ID band Patient awake    Reviewed: Allergy & Precautions, NPO status , Patient's Chart, lab work & pertinent test results  Airway Mallampati: II  TM Distance: >3 FB Neck ROM: Full    Dental no notable dental hx.    Pulmonary neg pulmonary ROS, Current Smoker and Patient abstained from smoking.   Pulmonary exam normal        Cardiovascular negative cardio ROS  Rhythm:Regular Rate:Normal     Neuro/Psych negative neurological ROS  negative psych ROS   GI/Hepatic Neg liver ROS,GERD  ,,  Endo/Other  negative endocrine ROS    Renal/GU negative Renal ROS  negative genitourinary   Musculoskeletal  (+) Arthritis , Osteoarthritis,    Abdominal Normal abdominal exam  (+)   Peds  Hematology negative hematology ROS (+)   Anesthesia Other Findings   Reproductive/Obstetrics                             Anesthesia Physical Anesthesia Plan  ASA: 2  Anesthesia Plan: General   Post-op Pain Management:    Induction: Intravenous  PONV Risk Score and Plan: 2 and Ondansetron, Dexamethasone, Midazolam and Treatment may vary due to age or medical condition  Airway Management Planned: Mask and LMA  Additional Equipment: None  Intra-op Plan:   Post-operative Plan: Extubation in OR  Informed Consent: I have reviewed the patients History and Physical, chart, labs and discussed the procedure including the risks, benefits and alternatives for the proposed anesthesia with the patient or authorized representative who has indicated his/her understanding and acceptance.     Dental advisory given  Plan Discussed with: CRNA  Anesthesia Plan Comments:        Anesthesia Quick Evaluation

## 2023-11-17 NOTE — Transfer of Care (Signed)
 Immediate Anesthesia Transfer of Care Note  Patient: [name]  Procedure(s) Performed: REMOVAL, HARDWARE (Left)  Patient Location: PACU  Anesthesia Type:General  Level of Consciousness: awake, alert , oriented, and patient cooperative  Airway & Oxygen Therapy: Patient Spontanous Breathing and Patient connected to nasal cannula oxygen  Post-op Assessment: Report given to RN and Post -op Vital signs reviewed and stable  Post vital signs: Reviewed and stable  Last Vitals:  Vitals Value Taken Time  BP 113/79 11/17/23 1404  Temp    Pulse 84 11/17/23 1408  Resp 12 11/17/23 1408  SpO2 95 % 11/17/23 1408  Vitals shown include unfiled device data.  Last Pain:  Vitals:   11/17/23 1155  TempSrc: Temporal  PainSc: 1       Patients Stated Pain Goal: 1 (11/17/23 1155)  Complications: No notable events documented.

## 2023-11-18 ENCOUNTER — Encounter (HOSPITAL_BASED_OUTPATIENT_CLINIC_OR_DEPARTMENT_OTHER): Payer: Self-pay | Admitting: Orthopaedic Surgery
